# Patient Record
Sex: Female | Born: 1975
Health system: Southern US, Community
[De-identification: ages and names within clinical notes are randomized; demographics above are authoritative.]

## PROBLEM LIST (undated history)

## (undated) DIAGNOSIS — F329 Major depressive disorder, single episode, unspecified: Secondary | ICD-10-CM

## (undated) DIAGNOSIS — Z9889 Other specified postprocedural states: Secondary | ICD-10-CM

## (undated) DIAGNOSIS — K219 Gastro-esophageal reflux disease without esophagitis: Secondary | ICD-10-CM

## (undated) DIAGNOSIS — G8929 Other chronic pain: Secondary | ICD-10-CM

## (undated) DIAGNOSIS — F32A Depression, unspecified: Secondary | ICD-10-CM

## (undated) DIAGNOSIS — F419 Anxiety disorder, unspecified: Secondary | ICD-10-CM

## (undated) DIAGNOSIS — R87629 Unspecified abnormal cytological findings in specimens from vagina: Secondary | ICD-10-CM

## (undated) DIAGNOSIS — R102 Pelvic and perineal pain: Secondary | ICD-10-CM

## (undated) DIAGNOSIS — G4700 Insomnia, unspecified: Secondary | ICD-10-CM

## (undated) DIAGNOSIS — N920 Excessive and frequent menstruation with regular cycle: Secondary | ICD-10-CM

## (undated) DIAGNOSIS — M51369 Other intervertebral disc degeneration, lumbar region without mention of lumbar back pain or lower extremity pain: Secondary | ICD-10-CM

## (undated) DIAGNOSIS — Z8742 Personal history of other diseases of the female genital tract: Secondary | ICD-10-CM

## (undated) DIAGNOSIS — R140 Abdominal distension (gaseous): Secondary | ICD-10-CM

## (undated) DIAGNOSIS — R61 Generalized hyperhidrosis: Secondary | ICD-10-CM

## (undated) DIAGNOSIS — Z87898 Personal history of other specified conditions: Secondary | ICD-10-CM

## (undated) DIAGNOSIS — K589 Irritable bowel syndrome without diarrhea: Secondary | ICD-10-CM

## (undated) DIAGNOSIS — IMO0002 Reserved for concepts with insufficient information to code with codable children: Secondary | ICD-10-CM

## (undated) DIAGNOSIS — M5126 Other intervertebral disc displacement, lumbar region: Secondary | ICD-10-CM

## (undated) DIAGNOSIS — R87619 Unspecified abnormal cytological findings in specimens from cervix uteri: Secondary | ICD-10-CM

## (undated) DIAGNOSIS — M5136 Other intervertebral disc degeneration, lumbar region: Secondary | ICD-10-CM

## (undated) DIAGNOSIS — M549 Dorsalgia, unspecified: Secondary | ICD-10-CM

## (undated) DIAGNOSIS — K529 Noninfective gastroenteritis and colitis, unspecified: Secondary | ICD-10-CM

## (undated) HISTORY — DX: Gastro-esophageal reflux disease without esophagitis: K21.9

## (undated) HISTORY — DX: Depression, unspecified: F32.A

## (undated) HISTORY — DX: Insomnia, unspecified: G47.00

## (undated) HISTORY — DX: Anxiety disorder, unspecified: F41.9

## (undated) HISTORY — DX: Pelvic and perineal pain: R10.2

## (undated) HISTORY — DX: Personal history of other diseases of the female genital tract: Z87.42

## (undated) HISTORY — DX: Excessive and frequent menstruation with regular cycle: N92.0

## (undated) HISTORY — DX: Noninfective gastroenteritis and colitis, unspecified: K52.9

## (undated) HISTORY — DX: Unspecified abnormal cytological findings in specimens from vagina: R87.629

## (undated) HISTORY — DX: Generalized hyperhidrosis: R61

## (undated) HISTORY — DX: Reserved for concepts with insufficient information to code with codable children: IMO0002

## (undated) HISTORY — DX: Irritable bowel syndrome, unspecified: K58.9

## (undated) HISTORY — DX: Major depressive disorder, single episode, unspecified: F32.9

## (undated) HISTORY — DX: Abdominal distension (gaseous): R14.0

## (undated) HISTORY — DX: Unspecified abnormal cytological findings in specimens from cervix uteri: R87.619

## (undated) HISTORY — DX: Personal history of other specified conditions: Z87.898

---

## 2000-10-21 ENCOUNTER — Encounter: Payer: Self-pay | Admitting: Family Medicine

## 2000-10-21 ENCOUNTER — Ambulatory Visit (HOSPITAL_COMMUNITY): Admission: RE | Admit: 2000-10-21 | Discharge: 2000-10-21 | Payer: Self-pay | Admitting: Family Medicine

## 2000-10-22 ENCOUNTER — Ambulatory Visit (HOSPITAL_COMMUNITY): Admission: RE | Admit: 2000-10-22 | Discharge: 2000-10-22 | Payer: Self-pay | Admitting: Family Medicine

## 2000-10-22 ENCOUNTER — Encounter: Payer: Self-pay | Admitting: Family Medicine

## 2001-12-29 ENCOUNTER — Emergency Department (HOSPITAL_COMMUNITY): Admission: EM | Admit: 2001-12-29 | Discharge: 2001-12-29 | Payer: Self-pay | Admitting: Emergency Medicine

## 2001-12-29 ENCOUNTER — Encounter: Payer: Self-pay | Admitting: Emergency Medicine

## 2008-11-30 ENCOUNTER — Encounter: Payer: Self-pay | Admitting: Urgent Care

## 2008-11-30 ENCOUNTER — Ambulatory Visit: Payer: Self-pay | Admitting: Gastroenterology

## 2008-11-30 DIAGNOSIS — M549 Dorsalgia, unspecified: Secondary | ICD-10-CM | POA: Insufficient documentation

## 2008-11-30 DIAGNOSIS — R109 Unspecified abdominal pain: Secondary | ICD-10-CM | POA: Insufficient documentation

## 2008-11-30 DIAGNOSIS — R197 Diarrhea, unspecified: Secondary | ICD-10-CM

## 2008-11-30 DIAGNOSIS — S329XXA Fracture of unspecified parts of lumbosacral spine and pelvis, initial encounter for closed fracture: Secondary | ICD-10-CM | POA: Insufficient documentation

## 2008-11-30 DIAGNOSIS — F411 Generalized anxiety disorder: Secondary | ICD-10-CM

## 2008-11-30 DIAGNOSIS — K219 Gastro-esophageal reflux disease without esophagitis: Secondary | ICD-10-CM | POA: Insufficient documentation

## 2008-12-04 LAB — CONVERTED CEMR LAB
ALT: 9 units/L (ref 0–35)
AST: 12 units/L (ref 0–37)
Albumin: 4.5 g/dL (ref 3.5–5.2)
Alkaline Phosphatase: 47 units/L (ref 39–117)
BUN: 9 mg/dL (ref 6–23)
Basophils Absolute: 0 10*3/uL (ref 0.0–0.1)
Basophils Relative: 0 % (ref 0–1)
CO2: 21 meq/L (ref 19–32)
Calcium: 9.2 mg/dL (ref 8.4–10.5)
Chloride: 106 meq/L (ref 96–112)
Creatinine, Ser: 0.81 mg/dL (ref 0.40–1.20)
Eosinophils Absolute: 0.1 10*3/uL (ref 0.0–0.7)
Eosinophils Relative: 1 % (ref 0–5)
Glucose, Bld: 112 mg/dL — ABNORMAL HIGH (ref 70–99)
HCT: 44.8 % (ref 36.0–46.0)
Hemoglobin: 13 g/dL (ref 12.0–15.0)
IgA: 118 mg/dL (ref 68–378)
Lymphocytes Relative: 20 % (ref 12–46)
Lymphs Abs: 1.9 10*3/uL (ref 0.7–4.0)
MCHC: 29 g/dL — ABNORMAL LOW (ref 30.0–36.0)
MCV: 105.9 fL — ABNORMAL HIGH (ref 78.0–100.0)
Monocytes Absolute: 0.4 10*3/uL (ref 0.1–1.0)
Monocytes Relative: 5 % (ref 3–12)
Neutro Abs: 7.1 10*3/uL (ref 1.7–7.7)
Neutrophils Relative %: 74 % (ref 43–77)
Platelets: 338 10*3/uL (ref 150–400)
Potassium: 4 meq/L (ref 3.5–5.3)
RBC: 4.23 M/uL (ref 3.87–5.11)
RDW: 12.7 % (ref 11.5–15.5)
Sed Rate: 2 mm/hr (ref 0–22)
Sodium: 139 meq/L (ref 135–145)
Tissue Transglutaminase Ab, IgA: 0.3 units (ref <7)
Total Bilirubin: 0.4 mg/dL (ref 0.3–1.2)
Total Protein: 6.4 g/dL (ref 6.0–8.3)
WBC: 9.6 10*3/uL (ref 4.0–10.5)

## 2008-12-06 ENCOUNTER — Encounter (INDEPENDENT_AMBULATORY_CARE_PROVIDER_SITE_OTHER): Payer: Self-pay | Admitting: *Deleted

## 2008-12-10 ENCOUNTER — Encounter: Payer: Self-pay | Admitting: Gastroenterology

## 2008-12-24 ENCOUNTER — Ambulatory Visit: Payer: Self-pay | Admitting: Gastroenterology

## 2008-12-24 ENCOUNTER — Ambulatory Visit (HOSPITAL_COMMUNITY): Admission: RE | Admit: 2008-12-24 | Discharge: 2008-12-24 | Payer: Self-pay | Admitting: Gastroenterology

## 2008-12-24 ENCOUNTER — Encounter: Payer: Self-pay | Admitting: Gastroenterology

## 2008-12-24 HISTORY — PX: UPPER GASTROINTESTINAL ENDOSCOPY: SHX188

## 2008-12-24 HISTORY — PX: COLONOSCOPY: SHX174

## 2008-12-31 ENCOUNTER — Encounter: Payer: Self-pay | Admitting: Gastroenterology

## 2009-01-01 ENCOUNTER — Ambulatory Visit: Payer: Self-pay | Admitting: Gastroenterology

## 2009-01-02 DIAGNOSIS — K589 Irritable bowel syndrome without diarrhea: Secondary | ICD-10-CM | POA: Insufficient documentation

## 2009-01-18 ENCOUNTER — Encounter (INDEPENDENT_AMBULATORY_CARE_PROVIDER_SITE_OTHER): Payer: Self-pay | Admitting: *Deleted

## 2009-01-28 ENCOUNTER — Ambulatory Visit: Payer: Self-pay | Admitting: Gastroenterology

## 2009-01-28 ENCOUNTER — Ambulatory Visit (HOSPITAL_COMMUNITY): Admission: RE | Admit: 2009-01-28 | Discharge: 2009-01-28 | Payer: Self-pay | Admitting: Gastroenterology

## 2009-01-29 ENCOUNTER — Telehealth (INDEPENDENT_AMBULATORY_CARE_PROVIDER_SITE_OTHER): Payer: Self-pay

## 2009-01-29 ENCOUNTER — Telehealth: Payer: Self-pay | Admitting: Gastroenterology

## 2009-01-29 HISTORY — PX: OTHER SURGICAL HISTORY: SHX169

## 2009-04-09 ENCOUNTER — Ambulatory Visit: Payer: Self-pay | Admitting: Gastroenterology

## 2009-04-12 DIAGNOSIS — R143 Flatulence: Secondary | ICD-10-CM

## 2009-04-12 DIAGNOSIS — R141 Gas pain: Secondary | ICD-10-CM

## 2009-04-12 DIAGNOSIS — R142 Eructation: Secondary | ICD-10-CM

## 2009-04-16 LAB — CONVERTED CEMR LAB: Creatinine, Ser: 0.89 mg/dL (ref 0.40–1.20)

## 2009-04-18 ENCOUNTER — Ambulatory Visit (HOSPITAL_COMMUNITY): Admission: RE | Admit: 2009-04-18 | Discharge: 2009-04-18 | Payer: Self-pay | Admitting: Gastroenterology

## 2009-04-23 ENCOUNTER — Encounter (INDEPENDENT_AMBULATORY_CARE_PROVIDER_SITE_OTHER): Payer: Self-pay

## 2009-06-04 ENCOUNTER — Other Ambulatory Visit: Admission: RE | Admit: 2009-06-04 | Discharge: 2009-06-04 | Payer: Self-pay | Admitting: Obstetrics & Gynecology

## 2010-04-01 NOTE — Letter (Signed)
Summary: Plan of Care, Need to Discuss  [facility]  [address]   [place], [place] [address]   Phone: [phone]  Fax: [phone]    April 23, 2009  KALI DEADWYLER [address] [place], [place]  [address] Dec 05, 1975   Dear Ms. Mayford Knife,   We are writing this letter to inform you of treatment plans and/or discuss your plan of care.  We have tried several times to contact you; however, we have yet to reach you.  We ask that you please contact our office for follow-up on your gastrointestinal issues.  We can  be reached at [phone] to schedule an appointment, or to speak with someone regarding your health care needs.  Please do not neglect your health.   Sincerely,    Cloria Spring LPN  [facility] Ph: [phone]    Fax: [phone]

## 2010-04-01 NOTE — Assessment & Plan Note (Signed)
Summary: ABD PAIN, BLOATING, DIARRHEA   Visit Type:  Follow-up Visit Primary Care Provider:  Regino Schultze, M.D.  Chief Complaint:  1 month follow up- still has same symptoms.  History of Present Illness: Pt's diarrhea is improved with Abx but bloating and abd pain never changed. Thinks she took hyoscyamine but it didn't help. Uses BR but diarrhea not as frequent. No difference with lactose free diet and even lactaid pills no difference. No difference with Align. Taking stuff for acid reflux. Bms: every day and sometime every other day, but when she goes and then has to go back. Diarrhea 3-4 times a week. Pain worse after eating and better after BM and passing gas.   Current Medications (verified): 1)  Omeprazole 20 Mg Cpdr (Omeprazole) .... Take 1 Tablet By Mouth Once A Day 2)  Muscle Relaxer .... 1/2 Tablet At Bedtime 3)  Lorcet Plus 7.5-650 Mg Tabs (Hydrocodone-Acetaminophen) .... As Needed, Very Seldom 4)  Xanax 0.5 Mg Tabs (Alprazolam) .... One By Mouth As Needed For Anxiety  Allergies (verified): No Known Drug Allergies  Past History:  Past Medical History: ANXIETY (ICD-300.00) BACK PAIN, CHRONIC (ICD-724.5) FRACTURE, PELVIS, RIGHT (ICD-808.8) GERD (ICD-530.81) 2008: ABDOMINAL PAIN (ICD-789.00) and DIARRHEA, CHRONIC (ICD-787.91) **WORKUP-OCT 2010-neg celiac serologies, cdiff, routine cx, pos fecal lactoferrin, TCS/EGD/Bx-nl colon, and duodenum, NOV 2010: HBT-pos for SBBO  Family History: Reviewed history from 11/30/2008 and no changes required. No known family history of colorectal carcinoma, IBD, liver or chronic GI problems. Father: (70) COPD Mother: (28) healthy Siblings: 2 sister 40/31 healthy  Social History: Reviewed history from 11/30/2008 and no changes required. married (1994) 2 sons (15/12) healthy Occupation: Physicist, medical Patient currently smokes. 20 pkyr Patient has been counseled to quit. No desire to quit now. Alcohol Use - no Daily Caffeine  Use Illicit Drug Use - no Patient gets regular exercise.  Review of Systems       Last ABD imaging 2002-ABD US-normal, HIDA SCAN-GB EF 48%  Vital Signs:  Patient profile:   35 year old female Height:      64 inches Weight:      175 pounds BMI:     30.15 Temp:     97.6 degrees F oral Pulse rate:   64 / minute BP sitting:   118 / 84  (left arm) Cuff size:   regular  Vitals Entered By: Hendricks Limes LPN (April 09, 2009 8:54 AM)  Physical Exam  General:  Well developed, well nourished, no acute distress. Head:  Normocephalic and atraumatic. Eyes:  PERRLA, no icterus. Mouth:  No deformity or lesions. Lungs:  Clear throughout to auscultation. Heart:  Regular rate and rhythm; no murmurs, rubs,  or bruits. Abdomen:  Soft, nondistended. Normal bowel sounds. Extremities:  No clubbing, cyanosis, edema or deformities noted. Neurologic:  Alert and  oriented x4;  grossly normal neurologically.  Impression & Recommendations:  Problem # 1:  ABDOMINAL PAIN (ICD-789.00) Assessment Unchanged  Most like 2o to IBS-d, but differential diagnosis includes adhesive disease, enodmetriosis, pancreatic or ovarian  malignancy or peritoneal carcinomatosis. Plan CT A/P w/ iv and oral contrast. Avoid food with bloating. SEE HANDOUT. Continue Imipramine 1 OR 2 at bedtime. Use Levsin 30 minute before meals TWICE A DAY. RETURN VISIT IN 2 MOS. Consider DA-GDF if no improvement.  CC: PCP  Orders: Est. Patient Level IV ([address])  Other Orders: T-Creatinine Blood ([id number])  Patient Instructions: 1)  Avoid food with bloating. SEE HANDOUT 2)  Continue Imipramine 1 OR 2 at bedtime.  3)  Use Levsin 30 minute before meals TWICE A DAY. 4)  Will order CAT SCAN. 5)  RETURN VISIT IN 2 MOS. 6)  The medication list was reviewed and reconciled.  All changed / newly prescribed medications were explained.  A complete medication list was provided to the patient / caregiver. Prescriptions: LEVSIN/SL 0.125 MG  SUBL (HYOSCYAMINE SULFATE) 1 sl prior to meals two times a day  #60 x 5   Entered and Authorized by:   West Bali MD   Signed by:   West Bali MD on 04/09/2009   Method used:   Electronically to        [facility] [phone]* (retail)       [address]       [place], [place]  [address]       Ph: [phone] or [phone]       Fax: [phone]   RxID:   [id number] IMIPRAMINE HCL 25 MG TABS (IMIPRAMINE HCL) 1-2 by mouth qhs  #60 x 5   Entered and Authorized by:   West Bali MD   Signed by:   West Bali MD on 04/09/2009   Method used:   [facility]ronically to        [facility] [phone]* (retail)       [address]       [place], [place]  [address]       Ph: [phone] or [phone]       Fax: [phone]   RxID:   [id number]   Appended Document: ABD PAIN, BLOATING, DIARRHEA Spoke witha  representative at [facility], Verlan Friends: Notification [phone] valid for 45 calendar days.   Appended Document: ABD PAIN, BLOATING, DIARRHEA Please call pt. Her CT shows no evidence of ovarian CA. Her Sx are most likely due to IBS. Cotninue Imipramine and Levsin. OPV in 2 mos.  Appended Document: ABD PAIN, BLOATING, DIARRHEA tried to call pt- LMOM. pt is scheduled for appt on 06/04/2009  Appended Document: ABD PAIN, BLOATING, DIARRHEA LMOM to call.  Appended Document: ABD PAIN, BLOATING, DIARRHEA Letter mailed to call to discuss.  Appended Document: ABD PAIN, BLOATING, DIARRHEA Pt called and was informed of the above.

## 2010-04-01 NOTE — Letter (Signed)
Summary: Internal Other  Internal Other   Imported By: Ave Filter 04/09/2009 09:55:04  _____________________________________________________________________  External Attachment:    Type:   Image     Comment:   External Document

## 2010-04-08 ENCOUNTER — Telehealth (INDEPENDENT_AMBULATORY_CARE_PROVIDER_SITE_OTHER): Payer: Self-pay | Admitting: *Deleted

## 2010-04-17 NOTE — Progress Notes (Signed)
Summary: PT WOULD LIKE TO TRANSFER HER CARE  ---- Converted from flag ---- ---- 04/08/2010 10:03 AM, Diana Eves wrote: I offered appt with RMR for Friday at 2pm, but pt declined. She wants her records transferred to [facility] office.  ---- 04/08/2010 7:37 AM, Jonathon Bellows MD, Caleen Essex wrote: What if I come to the office and see her in pm - next day or 2?  ---- 04/07/2010 4:52 PM, Diana Eves wrote: I received referral on pt from [facility] and she refuses to make appointment because she was not happy with [facility] and wants to transfer her records to [facility]. She said her symptoms never improved and were worse than before. ------------------------------

## 2010-05-27 ENCOUNTER — Ambulatory Visit (INDEPENDENT_AMBULATORY_CARE_PROVIDER_SITE_OTHER): Payer: 59 | Admitting: Internal Medicine

## 2010-05-27 DIAGNOSIS — R141 Gas pain: Secondary | ICD-10-CM

## 2010-05-27 DIAGNOSIS — K9089 Other intestinal malabsorption: Secondary | ICD-10-CM

## 2010-06-05 LAB — HEMOGLOBIN AND HEMATOCRIT, BLOOD
HCT: 37.6 % (ref 36.0–46.0)
Hemoglobin: 13.2 g/dL (ref 12.0–15.0)

## 2010-06-08 NOTE — Consult Note (Signed)
NAME:  Kathleen Stewart, Kathleen Stewart NO.:  [id number]  MEDICAL RECORD NO.:  [id number]          PATIENT TYPE:  LOCATION:                                 FACILITY:  PHYSICIAN:  Lionel December, M.D.         DATE OF BIRTH:  DATE OF CONSULTATION:  05/27/2010 DATE OF DISCHARGE:                                CONSULTATION   PRESENTING COMPLAINT:  Bloating and diarrhea.  HISTORY OF PRESENT ILLNESS:  Kathleen Stewart is a 35 year old Caucasian female who is referred through courtesy of Dr. Karleen Hampshire for GI evaluation.  Kathleen Stewart states that she has had problems with bloating for at least 10 years.  Initially, it was sporadic and mild, but lately she has had great deal of difficulty, particularly for the last years.  She feels bloated every day.  When she wakes up in the morning, she feels the best, but as the day progresses and she eats and drinks liquids, her bloating increases, and at times she feels as if she is pregnant.  Other times, she is so bloated that she has difficulty taking a deep breath. She also complains of diarrhea.  She has at least four stools every day. She denies melena or rectal bleeding.  She has never been constipated. She has noted some burping and passes flatus.  At times, it is foul smelling, but not always.  She also hears lot of rumbling and gurgling in her abdomen, and she may have some discomfort across her mid or upper abdomen, may be once a week.  Kathleen Stewart has been evaluated by Dr. Darrick Penna of [facility]. She had negative celiac antibody panel in October 2010.  Her chemistry panel was normal.  Her sed rate was 2.  She has been tried on lactose- free diet as well as took a lime for a month and she says it did not make any difference.  Also, had stool studies in October 2010, and these were negative except her lactoferrin was positive.  Finally, she had EGD and a colonoscopy.  She had normal terminal ileum, normal colonic mucosa, small external  hemorrhoids, and patchy erythema to gastric mucosa, and patchy mucosal abnormality to the duodenum.  She also had internal hemorrhoids.  A biopsy from colonic mucosa reveals no changes of colitis.  Duodenal biopsy similarly was unremarkable, and a gastric biopsy showed mild gastritis.  Stains were negative for H. pylori.  Finally, she had hydrogen breath test for small bowel bacterial overgrowth in November 2010, and this test was abnormal, consistent with small bowel bacterial overgrowth.  She was given antibiotic.  She does not remember the name.  She says it did not make any difference.  Kathleen Stewart states her appetite has been up and down; however, she has not lost any weight.  Along the way, she also took dicyclomine, but it did not help, so she stopped the medication.  She is not having any problems with skin rash or wheezing.  CURRENT MEDICATIONS: 1. Diazepam 10 mg p.o. at bedtime. 2. Hydrocodone 10/650 q.6 h. p.r.n., but she takes it occasionally. 3. Omeprazole 20 mg p.o. q.a.m. 4. Ranitidine 150  mg p.o. q.a.m.  PAST MEDICAL HISTORY:  She has had symptoms of GERD for 10 years.  She states symptoms are not well controlled unless he takes ranitidine along with her omeprazole.  She has chronic insomnia, history of depression, she was on therapy for 7 years, but was discontinued.  She has 20 tattoos placed by a professional.  Last year, she was also treated for H. pylori infection.  She has a chronic back pain possibly due to chip fracture, but no details are available.  ALLERGIES:  NK.  FAMILY HISTORY:  Mother in 7s in good health.  Father is 40 and has breathing problems.  She has two sisters in good health.  SOCIAL HISTORY:  She is married.  She has two sons ages 58 and 31 in good health.  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  Prior to that, she worked at [employer] and [employer] for 3 years.  She smokes about a pack a day, which she has done for 20 years,  and she does not drink alcohol.  She states the past when she tried drinking alcohol she developed epigastric pain and emesis.  OBJECTIVE:  VITAL SIGNS:  Weight 180 pounds.  She is 64-1/2 inch tall. Pulse 74 per minute, blood pressure 124/72, temperature is 97.9. HEENT:  Conjunctivae are pink.  Sclerae nonicteric.  Oropharyngeal mucosa is normal. NECK:  No neck masses or thyromegaly noted. CARDIAC:  Regular rhythm.  Normal S1 and S2.  No murmur or gallop noted. LUNGS:  Clear to auscultation. ABDOMEN:  Full, but symmetrical.  Bowel sounds are normal.  On palpation, soft abdomen without tenderness, organomegaly, or masses. Percussion note is tympanitic. EXTREMITIES:  No peripheral edema or clubbing noted.  She has multiple tattoos over the upper extremities.  ASSESSMENT:  Esbeydi is 35 year old Caucasian female who has been struggling with bloating for the last couple of years, and she also has diarrhea.  She has had fairly extensive workup by Dr. Jonette Eva.  Her stool studies were negative with the exception of positive lactoferrin, but she did not have any evidence of endoscopic or microscopic colitis and similarly duodenal biopsies did not show any evidence of mucosal disease, and her celiac antibody panel was negative.  Her hydrogen breath test was abnormal suggesting small bowel bacterial overgrowth. However, she did not respond to short course of antibiotic therapy. Before we embark on further evaluation, I believe she needs to be retreated with antibiotic, but for a longer duration.  Chronic GERD.  Symptoms appear to be well controlled with a combination of a PPI and H2B.  RECOMMENDATIONS:  The patient advised to eat yogurt daily.  We will start on Xifaxan, prescription given for 550 mg tablets 60.  The patient advised to take two-thirds of a pill 3 times a day.  She is also advised to keep symptom diary, and she will return for OV in 4 weeks.  We appreciate the  opportunity to participate in the care of this nice lady.     Lionel December, M.D.     NR/MEDQ  D:  05/28/2010  T:  05/28/2010  Job:  [id number]  cc:   Kirk Ruths, M.D. Fax: [phone]  Electronically Signed by Lionel December M.D. on 06/08/2010 01:59:09 PM

## 2010-07-01 ENCOUNTER — Ambulatory Visit (INDEPENDENT_AMBULATORY_CARE_PROVIDER_SITE_OTHER): Payer: 59 | Admitting: Internal Medicine

## 2010-08-19 ENCOUNTER — Ambulatory Visit (INDEPENDENT_AMBULATORY_CARE_PROVIDER_SITE_OTHER): Payer: 59 | Admitting: Internal Medicine

## 2010-08-19 ENCOUNTER — Other Ambulatory Visit (INDEPENDENT_AMBULATORY_CARE_PROVIDER_SITE_OTHER): Payer: Self-pay | Admitting: Internal Medicine

## 2010-08-19 ENCOUNTER — Ambulatory Visit (HOSPITAL_COMMUNITY)
Admission: RE | Admit: 2010-08-19 | Discharge: 2010-08-19 | Disposition: A | Discharge status: Home / Self Care | Payer: 59 | Source: Ambulatory Visit | Attending: Internal Medicine | Admitting: Internal Medicine

## 2010-08-19 DIAGNOSIS — R635 Abnormal weight gain: Secondary | ICD-10-CM

## 2010-08-19 DIAGNOSIS — R1032 Left lower quadrant pain: Secondary | ICD-10-CM

## 2010-08-19 DIAGNOSIS — R142 Eructation: Secondary | ICD-10-CM

## 2010-08-19 DIAGNOSIS — R140 Abdominal distension (gaseous): Secondary | ICD-10-CM

## 2010-08-19 MED ORDER — IOHEXOL 300 MG/ML  SOLN
100.0000 mL | Freq: Once | INTRAMUSCULAR | Status: AC | PRN
  Administered 2010-08-19: 100 mL via INTRAVENOUS

## 2010-09-07 LAB — BASIC METABOLIC PANEL
Creatinine: 0.9 mg/dL (ref 0.5–1.1)
Glucose: 89 mg/dL
Sodium: 141 mmol/L (ref 137–147)

## 2010-09-07 LAB — CBC AND DIFFERENTIAL: Hemoglobin: 12.7 g/dL (ref 12.0–16.0)

## 2010-09-23 ENCOUNTER — Other Ambulatory Visit (HOSPITAL_COMMUNITY): Payer: Self-pay | Admitting: Family Medicine

## 2010-09-23 DIAGNOSIS — M549 Dorsalgia, unspecified: Secondary | ICD-10-CM

## 2010-09-25 ENCOUNTER — Ambulatory Visit (HOSPITAL_COMMUNITY): Payer: 59

## 2010-09-26 ENCOUNTER — Ambulatory Visit (HOSPITAL_COMMUNITY)
Admission: RE | Admit: 2010-09-26 | Discharge: 2010-09-26 | Disposition: A | Discharge status: Home / Self Care | Payer: 59 | Source: Ambulatory Visit | Attending: Family Medicine | Admitting: Family Medicine

## 2010-09-26 DIAGNOSIS — M5126 Other intervertebral disc displacement, lumbar region: Secondary | ICD-10-CM | POA: Insufficient documentation

## 2010-09-26 DIAGNOSIS — M79609 Pain in unspecified limb: Secondary | ICD-10-CM | POA: Insufficient documentation

## 2010-09-26 DIAGNOSIS — M549 Dorsalgia, unspecified: Secondary | ICD-10-CM

## 2010-09-26 DIAGNOSIS — M545 Low back pain, unspecified: Secondary | ICD-10-CM | POA: Insufficient documentation

## 2010-10-02 ENCOUNTER — Telehealth (INDEPENDENT_AMBULATORY_CARE_PROVIDER_SITE_OTHER): Payer: Self-pay | Admitting: *Deleted

## 2010-10-02 DIAGNOSIS — R197 Diarrhea, unspecified: Secondary | ICD-10-CM

## 2010-10-02 DIAGNOSIS — R140 Abdominal distension (gaseous): Secondary | ICD-10-CM

## 2010-10-02 NOTE — Telephone Encounter (Signed)
Lab order faxed and letter sent to the patient..  

## 2010-10-13 ENCOUNTER — Other Ambulatory Visit: Payer: Self-pay | Admitting: Obstetrics & Gynecology

## 2010-10-13 ENCOUNTER — Other Ambulatory Visit (HOSPITAL_COMMUNITY)
Admission: RE | Admit: 2010-10-13 | Discharge: 2010-10-13 | Disposition: A | Discharge status: Home / Self Care | Payer: 59 | Source: Ambulatory Visit | Attending: Obstetrics & Gynecology | Admitting: Obstetrics & Gynecology

## 2010-10-13 DIAGNOSIS — Z01419 Encounter for gynecological examination (general) (routine) without abnormal findings: Secondary | ICD-10-CM | POA: Insufficient documentation

## 2010-10-15 ENCOUNTER — Encounter (INDEPENDENT_AMBULATORY_CARE_PROVIDER_SITE_OTHER): Payer: Self-pay

## 2010-11-10 ENCOUNTER — Ambulatory Visit (INDEPENDENT_AMBULATORY_CARE_PROVIDER_SITE_OTHER): Payer: 59 | Admitting: Internal Medicine

## 2010-12-02 ENCOUNTER — Ambulatory Visit (INDEPENDENT_AMBULATORY_CARE_PROVIDER_SITE_OTHER): Payer: 59 | Admitting: Internal Medicine

## 2010-12-02 ENCOUNTER — Encounter (INDEPENDENT_AMBULATORY_CARE_PROVIDER_SITE_OTHER): Payer: Self-pay

## 2011-12-24 ENCOUNTER — Other Ambulatory Visit (HOSPITAL_COMMUNITY): Payer: Self-pay | Admitting: Physical Medicine and Rehabilitation

## 2011-12-24 DIAGNOSIS — M545 Low back pain: Secondary | ICD-10-CM

## 2011-12-30 ENCOUNTER — Ambulatory Visit (HOSPITAL_COMMUNITY)
Admission: RE | Admit: 2011-12-30 | Discharge: 2011-12-30 | Disposition: A | Discharge status: Home / Self Care | Payer: 59 | Source: Ambulatory Visit | Attending: Physical Medicine and Rehabilitation | Admitting: Physical Medicine and Rehabilitation

## 2011-12-30 ENCOUNTER — Other Ambulatory Visit (HOSPITAL_COMMUNITY): Payer: 59

## 2011-12-30 DIAGNOSIS — M545 Low back pain, unspecified: Secondary | ICD-10-CM | POA: Insufficient documentation

## 2011-12-30 DIAGNOSIS — M5137 Other intervertebral disc degeneration, lumbosacral region: Secondary | ICD-10-CM | POA: Insufficient documentation

## 2011-12-30 DIAGNOSIS — M51379 Other intervertebral disc degeneration, lumbosacral region without mention of lumbar back pain or lower extremity pain: Secondary | ICD-10-CM | POA: Insufficient documentation

## 2011-12-30 DIAGNOSIS — M5126 Other intervertebral disc displacement, lumbar region: Secondary | ICD-10-CM | POA: Insufficient documentation

## 2012-10-19 ENCOUNTER — Other Ambulatory Visit (HOSPITAL_COMMUNITY)
Admission: RE | Admit: 2012-10-19 | Discharge: 2012-10-19 | Disposition: A | Discharge status: Home / Self Care | Payer: 59 | Source: Ambulatory Visit | Attending: Adult Health | Admitting: Adult Health

## 2012-10-19 ENCOUNTER — Encounter: Payer: Self-pay | Admitting: Adult Health

## 2012-10-19 ENCOUNTER — Ambulatory Visit (INDEPENDENT_AMBULATORY_CARE_PROVIDER_SITE_OTHER): Payer: 59 | Admitting: Adult Health

## 2012-10-19 VITALS — BP 124/68 | HR 72 | Ht 64.5 in | Wt 186.0 lb

## 2012-10-19 DIAGNOSIS — Z01419 Encounter for gynecological examination (general) (routine) without abnormal findings: Secondary | ICD-10-CM | POA: Insufficient documentation

## 2012-10-19 DIAGNOSIS — Z87898 Personal history of other specified conditions: Secondary | ICD-10-CM

## 2012-10-19 DIAGNOSIS — Z1151 Encounter for screening for human papillomavirus (HPV): Secondary | ICD-10-CM | POA: Insufficient documentation

## 2012-10-19 HISTORY — DX: Personal history of other specified conditions: Z87.898

## 2012-10-19 NOTE — Progress Notes (Signed)
Patient ID: Kathleen Stewart, female   DOB: Jul 19, 1975, 37 y.o.   MRN: [id number] History of Present Illness: Kathleen Stewart is a 37 year old white female married in for a pap and physical.She complains of oily skin and what sounds like cystic acne, she has seen dermatologist in past.   Current Medications, Allergies, Past Medical History, Past Surgical History, Family History and Social History were reviewed in [name] record.     Review of Systems: Patient denies any headaches, blurred vision, shortness of breath, chest pain, abdominal pain, problems with bowel movements, urination, or intercourse. No joint pain, but she has back pain, no mood changes, she has insomnia and has had reflux issues and bloating.She has appt with Dr Regino Schultze in future.    Physical Exam:BP 124/68  Pulse 72  Ht 5' 4.5" (1.638 m)  Wt 186 lb (84.369 kg)  BMI 31.45 kg/m2  LMP 10/08/2012 General:  Well developed, well nourished, no acute distress Skin:  Warm and dry, tan and tattooed  Neck:  Midline trachea, normal thyroid Lungs; Clear to auscultation bilaterally Breast:  No dominant palpable mass, retraction, or nipple discharge Cardiovascular: Regular rate and rhythm Abdomen:  Soft, non tender, no hepatosplenomegaly Pelvic:  External genitalia is normal in appearance.  The vagina is normal in appearance. The cervix is bulbous. Pap performed with HPV. Uterus is felt to be normal size, shape, and contour.  No                adnexal masses or tenderness noted. Extremities:  No swelling or varicosities noted Psych: alert and cooperative, seems happy   Impression: Yearly gyn exam  History of abnormal pap Oily skin   Plan: Try toner, powder makeup and change it every 6 months,may need to F/U with dermatologist Physical in 1 year Mammogram at 39  Labs with PCP

## 2012-10-19 NOTE — Patient Instructions (Addendum)
Physical in 1 year Mammogram at 39  Labs with PCP 

## 2012-10-21 ENCOUNTER — Other Ambulatory Visit (HOSPITAL_COMMUNITY): Payer: Self-pay | Admitting: Family Medicine

## 2012-10-21 DIAGNOSIS — R109 Unspecified abdominal pain: Secondary | ICD-10-CM

## 2012-10-24 ENCOUNTER — Telehealth: Payer: Self-pay | Admitting: Adult Health

## 2012-10-24 NOTE — Telephone Encounter (Signed)
Pt aware of abnormal pap and need for colpo, will make appt 

## 2012-10-27 ENCOUNTER — Ambulatory Visit (HOSPITAL_COMMUNITY)
Admission: RE | Admit: 2012-10-27 | Discharge: 2012-10-27 | Disposition: A | Discharge status: Home / Self Care | Payer: 59 | Source: Ambulatory Visit | Attending: Family Medicine | Admitting: Family Medicine

## 2012-10-27 DIAGNOSIS — K8020 Calculus of gallbladder without cholecystitis without obstruction: Secondary | ICD-10-CM | POA: Insufficient documentation

## 2012-10-27 DIAGNOSIS — R109 Unspecified abdominal pain: Secondary | ICD-10-CM | POA: Insufficient documentation

## 2012-11-01 ENCOUNTER — Ambulatory Visit (INDEPENDENT_AMBULATORY_CARE_PROVIDER_SITE_OTHER): Payer: 59 | Admitting: Obstetrics and Gynecology

## 2012-11-01 ENCOUNTER — Other Ambulatory Visit: Payer: Self-pay | Admitting: Obstetrics and Gynecology

## 2012-11-01 ENCOUNTER — Encounter: Payer: Self-pay | Admitting: Obstetrics and Gynecology

## 2012-11-01 VITALS — BP 108/78 | Ht 64.0 in | Wt 184.8 lb

## 2012-11-01 DIAGNOSIS — Z87898 Personal history of other specified conditions: Secondary | ICD-10-CM

## 2012-11-01 DIAGNOSIS — N870 Mild cervical dysplasia: Secondary | ICD-10-CM

## 2012-11-01 DIAGNOSIS — IMO0002 Reserved for concepts with insufficient information to code with codable children: Secondary | ICD-10-CM

## 2012-11-01 NOTE — Patient Instructions (Addendum)
Cervical Dysplasia Cervical dysplasia is a condition in which a woman has abnormal changes in the cells of her cervix. The cervix is the opening to the uterus (womb) between the vagina and the uterus. These changes are called cervical dysplasia and may be the first signs of cervical cancer. These cells can be taken from the cervix during a Pap test and then looked at under a microscope. With early detection, treatment, and close follow-up care, nearly all cervical dysplasia can be cured. If untreated, the mild to moderate stages of dysplasia often grow more severe.  RISK FACTORS  The following increase the risk for cervical dysplasia.  Having had a sexually transmitted disease, including:  Chlamydia.  Human papilloma virus (HPV).  Becoming sexually active before age 60.  Having had more than 1 sexual partner.  Not using protection, such as condoms, during sexual intercourse, especially with new sexual partners.  Having had cancer of the vagina or vulva.  Having a sexual partner whose previous partner had cancer of the cervix or cervical dysplasia.  Having a sexual partner who has or has had cancer of the penis.  Having a weakened immune system (HIV, organ transplant).  Being the daughter of a woman who took DES (diethylstilbestrol) during pregnancy.  A history of cervical cancer in a woman's sister or mother.  Smoking.  Having had an abnormal Pap test in the past. SYMPTOMS  There are usually no symptoms. If there are symptoms, they may be vague such as:  Abnormal vaginal discharge.  Bleeding between periods or following intercourse.  Bleeding during menopause.  Pain on intercourse (dyspareunia). DIAGNOSIS   The Pap test is the best way of detecting abnormalities of the cervix.  Biopsy (removing a piece of tissue to look at under the microscope) of the cervix when the Pap test is abnormal or when the Pap test is normal, but the cervix looks abnormal. TREATMENT   Catching and treating the changes early with Pap tests can prevent cervical cancer.  Cryotherapy freezes the abnormal cells with a steel tip instrument.  A laser can be used to remove the abnormal cells.  Loop electrocautery excision procedure (LEEP). This procedure uses a heated electrical loop to remove a cone-like portion of the cervix, including the cervical canal.  For more serious cases of cervical dysplasia, the abnormal tissue may be removed surgically by:  A cone biopsy (by cold knife, laser or LEEP). A procedure in which a portion of the center of the cervix with the cervical canal is removed.  The uterus and cervix are removed (hysterectomy). Your caregiver will advise you regarding the need and timing of Pap tests in your follow-up. Women who have been treated for dysplasia should be closely followed with pelvic exams and Pap tests. During the first year following treatment of cervical dysplasia, Pap tests should be done every 3 to 4 months. In the second year, the schedule is every 6 months, or as recommended by your caregiver. See your caregiver for new or worsening problems. HOME CARE INSTRUCTIONS   Follow the instructions and recommendations of your caregiver regarding medicines and follow-up appointments.  Only take over-the-counter or prescription medicines for pain or discomfort as directed by your caregiver.  Cramping and pelvic discomfort may follow cryotherapy. It is not abnormal to have watery discharge for several weeks after.  Laser, cone surgery, cryotherapy or LEEP can cause a bad smelling vaginal discharge. It may also cause vaginal bleeding for a couple weeks following the procedure. The discharge  may be black from the paste used to control bleeding from the cone site. This is normal.  Do not use tampons, have sexual intercourse or douche until your caregiver says it is okay. SEEK MEDICAL CARE IF:   You develop genital warts.  You need a prescription for  pain medicine following your treatment. SEEK IMMEDIATE MEDICAL CARE IF:   Your bleeding is heavier than a normal menstrual period.  You develop bright red bleeding, especially if you have blood clots.  You have a fever.  You have increasing cramps or pain not relieved with medicine.  You are lightheaded, unusually weak, or have fainting spells.  You have abnormal vaginal discharge.  You develop abdominal pain. PREVENTION   The surest way to prevent cervical dysplasia is to abstain from sexual intercourse.  Practice safe sex, use condoms and have only one sex partner who does not have other sex partners.  A Pap test is done to screen for cervical cancer.  The first Pap test should be done at age 58.  Between ages 62 and 26, Pap tests are repeated every 2 years.  Beginning at age 61, you are advised to have a Pap test every 3 years as long as your past 3 Pap tests have been normal.  Some women have medical problems that increase the chance of getting cervical cancer. Talk to your caregiver about these problems. It is especially important to talk to your caregiver if a new problem develops soon after your last Pap test. In these cases, your caregiver may recommend more frequent screening and Pap tests.  The above recommendations are the same for women who have or have not gotten the vaccine for HPV (Human Papillomavirus).  If you had a hysterectomy for a problem that was not a cancer or a condition that could lead to cancer, then you no longer need Pap tests. However, even if you no longer need a Pap test, a regular exam is a good idea to make sure no other problems are starting.   If you are between ages 76 and 72, and you have had normal Pap tests going back 10 years, you no longer need Pap tests. However, even if you no longer need a Pap test, a regular exam is a good idea to make sure no other problems are starting.   If you have had past treatment for cervical cancer or a  condition that could lead to cancer, you need Pap tests and screening for cancer for at least 20 years after your treatment.  If Pap tests have been discontinued, risk factors (such as a new sexual partner) need to be re-assessed to determine if screening should be resumed.  Some women may need screenings more often if they are at high risk for cervical cancer.  Your caregiver may do additional tests including:  Colposcopy. A procedure in which a special microscope magnifies the cells and allows the provider to closely examine the cervix, vagina, and vulva.  Biopsy. A small tissue sample is taken from the cervix, vagina or vulva. This is generally done in your caregivers office.  A cone biopsy (cold knife or laser). A large tissue sample is taken from the cervix. This procedure is usually done in an operating room under a general anesthetic. The cone often removes all abnormal tissue and so may also complete the treatment.  LEEP, also removing a circular portion of the cervix and is done in a doctors office under a local anesthetic.  Now there  is a vaccine, Gardasil, that was developed to prevent the HPV'S that can cause cancer of the cervix and genital warts. It is recommended for females ages 20 to 2. It should not be given to pregnant women until more is known about its effects on the fetus. Not all cancers of the cervix are caused by the HPV. Routine gynecology exams and Pap tests should continue as recommended by your caregiver. Document Released: 02/16/2005 Document Revised: 05/11/2011 Document Reviewed: 02/08/2008 [facility] Patient Information 2014 [place], [place].   RESULTS TO BE DISCUSSED BY PHONE IN 2 WEEKS.  IF PT HAS NOT BEEN CONTACTED IN 2 WEEKS, PT TO CALL THIS OFFICE.

## 2012-11-02 NOTE — Progress Notes (Signed)
GYNECOLOGY CLINIC PROCEDURE NOTE  37 y.o. [id number] here for colposcopy for low-grade squamous intraepithelial neoplasia (LGSIL - encompassing HPV,mild dysplasia,CIN I) pap smear on 10/19/12. Discussed role for HPV in cervical dysplasia, need for surveillance.  Patient given informed consent, signed copy in the chart, time out was performed.  Placed in lithotomy position. Cervix viewed with speculum and colposcope after application of acetic acid.   Colposcopy adequate? Yes  no visible lesions; biopsies obtained. At 11, 1 oclock on anterior lip  ECC specimen obtained. All specimens were labelled and sent to pathology. Treated with monsels. ebl 20cc  Patient was given post procedure instructions.  Will follow up pathology and manage accordingly.  Routine preventative health maintenance measures emphasized.

## 2012-11-23 ENCOUNTER — Telehealth: Payer: Self-pay | Admitting: Obstetrics and Gynecology

## 2012-11-23 NOTE — Telephone Encounter (Signed)
Pt informed of abnormal (CIN-1) biopsy results from 11/01/2012. Pt to have pap with co-testing in 1 year per Dr. Emelda Fear.

## 2013-10-25 ENCOUNTER — Ambulatory Visit (INDEPENDENT_AMBULATORY_CARE_PROVIDER_SITE_OTHER): Payer: 59 | Admitting: Adult Health

## 2013-10-25 ENCOUNTER — Encounter: Payer: Self-pay | Admitting: Adult Health

## 2013-10-25 ENCOUNTER — Other Ambulatory Visit (HOSPITAL_COMMUNITY)
Admission: RE | Admit: 2013-10-25 | Discharge: 2013-10-25 | Disposition: A | Discharge status: Home / Self Care | Payer: 59 | Source: Ambulatory Visit | Attending: Adult Health | Admitting: Adult Health

## 2013-10-25 VITALS — BP 120/80 | HR 76 | Ht 64.0 in | Wt 183.0 lb

## 2013-10-25 DIAGNOSIS — Z1151 Encounter for screening for human papillomavirus (HPV): Secondary | ICD-10-CM | POA: Insufficient documentation

## 2013-10-25 DIAGNOSIS — Z01419 Encounter for gynecological examination (general) (routine) without abnormal findings: Secondary | ICD-10-CM | POA: Insufficient documentation

## 2013-10-25 DIAGNOSIS — Z8742 Personal history of other diseases of the female genital tract: Secondary | ICD-10-CM | POA: Insufficient documentation

## 2013-10-25 HISTORY — DX: Personal history of other diseases of the female genital tract: Z87.42

## 2013-10-25 LAB — CBC
HCT: 38.2 % (ref 36.0–46.0)
Hemoglobin: 13 g/dL (ref 12.0–15.0)
MCH: 30.9 pg (ref 26.0–34.0)
MCHC: 34 g/dL (ref 30.0–36.0)
MCV: 90.7 fL (ref 78.0–100.0)
PLATELETS: 338 10*3/uL (ref 150–400)
RBC: 4.21 MIL/uL (ref 3.87–5.11)
RDW: 13.1 % (ref 11.5–15.5)
WBC: 6.7 10*3/uL (ref 4.0–10.5)

## 2013-10-25 LAB — LIPID PANEL
CHOL/HDL RATIO: 5 ratio
CHOLESTEROL: 189 mg/dL (ref 0–200)
HDL: 38 mg/dL — AB (ref >39)
LDL Cholesterol: 117 mg/dL — ABNORMAL HIGH (ref 0–99)
TRIGLYCERIDES: 171 mg/dL — AB (ref <150)
VLDL: 34 mg/dL (ref 0–40)

## 2013-10-25 LAB — COMPREHENSIVE METABOLIC PANEL
ALT: 11 U/L (ref 0–35)
AST: 14 U/L (ref 0–37)
Albumin: 4.2 g/dL (ref 3.5–5.2)
Alkaline Phosphatase: 52 U/L (ref 39–117)
BILIRUBIN TOTAL: 0.4 mg/dL (ref 0.2–1.2)
BUN: 7 mg/dL (ref 6–23)
CO2: 27 meq/L (ref 19–32)
CREATININE: 0.83 mg/dL (ref 0.50–1.10)
Calcium: 9.2 mg/dL (ref 8.4–10.5)
Chloride: 107 mEq/L (ref 96–112)
GLUCOSE: 86 mg/dL (ref 70–99)
Potassium: 4.3 mEq/L (ref 3.5–5.3)
Sodium: 139 mEq/L (ref 135–145)
Total Protein: 6.3 g/dL (ref 6.0–8.3)

## 2013-10-25 NOTE — Progress Notes (Signed)
Patient ID: Kathleen Stewart, female   DOB: 15-Dec-1975, 38 y.o.   MRN: [id number] History of Present Illness: Kathleen Stewart is a 38 year old white female in for a pap and physical,no complaints.She had a pap 10/19/12 LSIL then colpo with CIN I.   Current Medications, Allergies, Past Medical History, Past Surgical History, Family History and Social History were reviewed in [name] record.     Review of Systems: Patient denies any headaches, blurred vision, shortness of breath, chest pain, abdominal pain, problems with bowel movements, urination, or intercourse. No joint pain or mood changes.Declines to quit smoking at present time.    Physical Exam:BP 120/80  Pulse 76  Ht  (1.626 m)  Wt 183 lb (83.008 kg)  BMI 31.40 kg/m2  LMP 10/15/2013 General:  Well developed, well nourished, no acute distress Skin:  Warm and dry, tan, has tattoos Neck:  Midline trachea, normal thyroid Lungs; Clear to auscultation bilaterally Breast:  No dominant palpable mass, retraction, or nipple discharge Cardiovascular: Regular rate and rhythm Abdomen:  Soft, non tender, no hepatosplenomegaly Pelvic:  External genitalia is normal in appearance.  The vagina is normal in appearance.  The cervix is bulbous, pap with HPV performed.Marland Kitchen  Uterus is felt to be normal size, shape, and contour.  No                adnexal masses or tenderness noted. Extremities:  No swelling or varicosities noted Psych:  No mood changes,alert and cooperative,seems happy   Impression: Yearly gyn exam History of abnormal pap    Plan: Check CBC,CMP,TSH and lipids Physical in 1 year Mammogram at 39

## 2013-10-25 NOTE — Patient Instructions (Signed)
Physical in 1 year Mammogram at 39  Labs in near future 

## 2013-10-26 LAB — CYTOLOGY - PAP

## 2013-10-26 LAB — TSH: TSH: 0.866 u[IU]/mL (ref 0.350–4.500)

## 2013-10-27 ENCOUNTER — Telehealth: Payer: Self-pay | Admitting: Adult Health

## 2013-10-27 NOTE — Telephone Encounter (Signed)
Pt aware of pap and labs, decrease cars and increase exercise

## 2014-01-01 ENCOUNTER — Encounter: Payer: Self-pay | Admitting: Adult Health

## 2014-04-23 ENCOUNTER — Telehealth: Payer: Self-pay | Admitting: Adult Health

## 2014-04-23 NOTE — Telephone Encounter (Signed)
No voice mail box.

## 2014-04-23 NOTE — Telephone Encounter (Signed)
Complains of being hot and irritable with irregular periods and they are heavier, to make appt, will get some labs

## 2014-04-25 ENCOUNTER — Ambulatory Visit (INDEPENDENT_AMBULATORY_CARE_PROVIDER_SITE_OTHER): Payer: 59 | Admitting: Adult Health

## 2014-04-25 ENCOUNTER — Encounter: Payer: Self-pay | Admitting: Adult Health

## 2014-04-25 VITALS — BP 102/72 | HR 78 | Ht 64.0 in | Wt 189.0 lb

## 2014-04-25 DIAGNOSIS — Z3202 Encounter for pregnancy test, result negative: Secondary | ICD-10-CM

## 2014-04-25 DIAGNOSIS — R102 Pelvic and perineal pain: Secondary | ICD-10-CM

## 2014-04-25 DIAGNOSIS — N920 Excessive and frequent menstruation with regular cycle: Secondary | ICD-10-CM

## 2014-04-25 DIAGNOSIS — R61 Generalized hyperhidrosis: Secondary | ICD-10-CM

## 2014-04-25 DIAGNOSIS — N921 Excessive and frequent menstruation with irregular cycle: Secondary | ICD-10-CM

## 2014-04-25 HISTORY — DX: Excessive and frequent menstruation with regular cycle: N92.0

## 2014-04-25 HISTORY — DX: Pelvic and perineal pain: R10.2

## 2014-04-25 HISTORY — DX: Generalized hyperhidrosis: R61

## 2014-04-25 LAB — POCT URINE PREGNANCY: PREG TEST UR: NEGATIVE

## 2014-04-25 NOTE — Patient Instructions (Addendum)
Perimenopause Perimenopause is the time when your body begins to move into the menopause (no menstrual period for 12 straight months). It is a natural process. Perimenopause can begin 2-8 years before the menopause and usually lasts for 1 year after the menopause. During this time, your ovaries may or may not produce an egg. The ovaries vary in their production of estrogen and progesterone hormones each month. This can cause irregular menstrual periods, difficulty getting pregnant, vaginal bleeding between periods, and uncomfortable symptoms. CAUSES  Irregular production of the ovarian hormones, estrogen and progesterone, and not ovulating every month.  Other causes include:  Tumor of the pituitary gland in the brain.  Medical disease that affects the ovaries.  Radiation treatment.  Chemotherapy.  Unknown causes.  Heavy smoking and excessive alcohol intake can bring on perimenopause sooner. SIGNS AND SYMPTOMS   Hot flashes.  Night sweats.  Irregular menstrual periods.  Decreased sex drive.  Vaginal dryness.  Headaches.  Mood swings.  Depression.  Memory problems.  Irritability.  Tiredness.  Weight gain.  Trouble getting pregnant.  The beginning of losing bone cells (osteoporosis).  The beginning of hardening of the arteries (atherosclerosis). DIAGNOSIS  Your health care provider will make a diagnosis by analyzing your age, menstrual history, and symptoms. He or she will do a physical exam and note any changes in your body, especially your female organs. Female hormone tests may or may not be helpful depending on the amount of female hormones you produce and when you produce them. However, other hormone tests may be helpful to rule out other problems. TREATMENT  In some cases, no treatment is needed. The decision on whether treatment is necessary during the perimenopause should be made by you and your health care provider based on how the symptoms are affecting you  and your lifestyle. Various treatments are available, such as:  Treating individual symptoms with a specific medicine for that symptom.  Herbal medicines that can help specific symptoms.  Counseling.  Group therapy. HOME CARE INSTRUCTIONS   Keep track of your menstrual periods (when they occur, how heavy they are, how long between periods, and how long they last) as well as your symptoms and when they started.  Only take over-the-counter or prescription medicines as directed by your health care provider.  Sleep and rest.  Exercise.  Eat a diet that contains calcium (good for your bones) and soy (acts like the estrogen hormone).  Do not smoke.  Avoid alcoholic beverages.  Take vitamin supplements as recommended by your health care provider. Taking vitamin E may help in certain cases.  Take calcium and vitamin D supplements to help prevent bone loss.  Group therapy is sometimes helpful.  Acupuncture may help in some cases. SEEK MEDICAL CARE IF:   You have questions about any symptoms you are having.  You need a referral to a specialist (gynecologist, psychiatrist, or psychologist). SEEK IMMEDIATE MEDICAL CARE IF:   You have vaginal bleeding.  Your period lasts longer than 8 days.  Your periods are recurring sooner than 21 days.  You have bleeding after intercourse.  You have severe depression.  You have pain when you urinate.  You have severe headaches.  You have vision problems. Document Released: 03/26/2004 Document Revised: 12/07/2012 Document Reviewed: 09/15/2012 [facility] Patient Information 2015 [place], [place]. This information is not intended to replace advice given to you by your health care provider. Make sure you discuss any questions you have with your health care provider. Pelvic Pain Female pelvic pain  can be caused by many different things and start from a variety of places. Pelvic pain refers to pain that is located in the lower half of the  abdomen and between your hips. The pain may occur over a short period of time (acute) or may be reoccurring (chronic). The cause of pelvic pain may be related to disorders affecting the female reproductive organs (gynecologic), but it may also be related to the bladder, kidney stones, an intestinal complication, or muscle or skeletal problems. Getting help right away for pelvic pain is important, especially if there has been severe, sharp, or a sudden onset of unusual pain. It is also important to get help right away because some types of pelvic pain can be life threatening.  CAUSES  Below are only some of the causes of pelvic pain. The causes of pelvic pain can be in one of several categories.   Gynecologic.  Pelvic inflammatory disease.  Sexually transmitted infection.  Ovarian cyst or a twisted ovarian ligament (ovarian torsion).  Uterine lining that grows outside the uterus (endometriosis).  Fibroids, cysts, or tumors.  Ovulation.  Pregnancy.  Pregnancy that occurs outside the uterus (ectopic pregnancy).  Miscarriage.  Labor.  Abruption of the placenta or ruptured uterus.  Infection.  Uterine infection (endometritis).  Bladder infection.  Diverticulitis.  Miscarriage related to a uterine infection (septic abortion).  Bladder.  Inflammation of the bladder (cystitis).  Kidney stone(s).  Gastrointestinal.  Constipation.  Diverticulitis.  Neurologic.  Trauma.  Feeling pelvic pain because of mental or emotional causes (psychosomatic).  Cancers of the bowel or pelvis. EVALUATION  Your caregiver will want to take a careful history of your concerns. This includes recent changes in your health, a careful gynecologic history of your periods (menses), and a sexual history. Obtaining your family history and medical history is also important. Your caregiver may suggest a pelvic exam. A pelvic exam will help identify the location and severity of the pain. It also helps  in the evaluation of which organ system may be involved. In order to identify the cause of the pelvic pain and be properly treated, your caregiver may order tests. These tests may include:   A pregnancy test.  Pelvic ultrasonography.  An X-ray exam of the abdomen.  A urinalysis or evaluation of vaginal discharge.  Blood tests. HOME CARE INSTRUCTIONS   Only take over-the-counter or prescription medicines for pain, discomfort, or fever as directed by your caregiver.   Rest as directed by your caregiver.   Eat a balanced diet.   Drink enough fluids to make your urine clear or pale yellow, or as directed.   Avoid sexual intercourse if it causes pain.   Apply warm or cold compresses to the lower abdomen depending on which one helps the pain.   Avoid stressful situations.   Keep a journal of your pelvic pain. Write down when it started, where the pain is located, and if there are things that seem to be associated with the pain, such as food or your menstrual cycle.  Follow up with your caregiver as directed.  SEEK MEDICAL CARE IF:  Your medicine does not help your pain.  You have abnormal vaginal discharge. SEEK IMMEDIATE MEDICAL CARE IF:   You have heavy bleeding from the vagina.   Your pelvic pain increases.   You feel light-headed or faint.   You have chills.   You have pain with urination or blood in your urine.   You have uncontrolled diarrhea or vomiting.  You have a fever or persistent symptoms for more than 3 days.  You have a fever and your symptoms suddenly get worse.   You are being physically or sexually abused.  MAKE SURE YOU:  Understand these instructions.  Will watch your condition.  Will get help if you are not doing well or get worse. Document Released: 01/14/2004 Document Revised: 07/03/2013 Document Reviewed: 06/08/2011 [facility] Patient Information 2015 [place], [place]. This information is not intended to replace advice  given to you by your health care provider. Make sure you discuss any questions you have with your health care provider. Menorrhagia Menorrhagia is the medical term for when your menstrual periods are heavy or last longer than usual. With menorrhagia, every period you have may cause enough blood loss and cramping that you are unable to maintain your usual activities. CAUSES  In some cases, the cause of heavy periods is unknown, but a number of conditions may cause menorrhagia. Common causes include:  A problem with the hormone-producing thyroid gland (hypothyroid).  Noncancerous growths in the uterus (polyps or fibroids).  An imbalance of the estrogen and progesterone hormones.  One of your ovaries not releasing an egg during one or more months.  Side effects of having an intrauterine device (IUD).  Side effects of some medicines, such as anti-inflammatory medicines or blood thinners.  A bleeding disorder that stops your blood from clotting normally. SIGNS AND SYMPTOMS  During a normal period, bleeding lasts between 4 and 8 days. Signs that your periods are too heavy include:  You routinely have to change your pad or tampon every 1 or 2 hours because it is completely soaked.  You pass blood clots larger than 1 inch (2.5 cm) in size.  You have bleeding for more than 7 days.  You need to use pads and tampons at the same time because of heavy bleeding.  You need to wake up to change your pads or tampons during the night.  You have symptoms of anemia, such as tiredness, fatigue, or shortness of breath. DIAGNOSIS  Your health care provider will perform a physical exam and ask you questions about your symptoms and menstrual history. Other tests may be ordered based on what the health care provider finds during the exam. These tests can include:  Blood tests. Blood tests are used to check if you are pregnant or have hormonal changes, a bleeding or thyroid disorder, low iron levels  (anemia), or other problems.  Endometrial biopsy. Your health care provider takes a sample of tissue from the inside of your uterus to be examined under a microscope.  Pelvic ultrasound. This test uses sound waves to make a picture of your uterus, ovaries, and vagina. The pictures can show if you have fibroids or other growths.  Hysteroscopy. For this test, your health care provider will use a small telescope to look inside your uterus. Based on the results of your initial tests, your health care provider may recommend further testing. TREATMENT  Treatment may not be needed. If it is needed, your health care provider may recommend treatment with one or more medicines first. If these do not reduce bleeding enough, a surgical treatment might be an option. The best treatment for you will depend on:   Whether you need to prevent pregnancy.  Your desire to have children in the future.  The cause and severity of your bleeding.  Your opinion and personal preference.  Medicines for menorrhagia may include:  Birth control methods that use hormones. These  include the pill, skin patch, vaginal ring, shots that you get every 3 months, hormonal IUD, and implant. These treatments reduce bleeding during your menstrual period.  Medicines that thicken blood and slow bleeding.  Medicines that reduce swelling, such as ibuprofen.  Medicines that contain a synthetic hormone called progestin.   Medicines that make the ovaries stop working for a short time.  You may need surgical treatment for menorrhagia if the medicines are unsuccessful. Treatment options include:  Dilation and curettage (D&C). In this procedure, your health care provider opens (dilates) your cervix and then scrapes or suctions tissue from the lining of your uterus to reduce menstrual bleeding.  Operative hysteroscopy. This procedure uses a tiny tube with a light (hysteroscope) to view your uterine cavity and can help in the  surgical removal of a polyp that may be causing heavy periods.  Endometrial ablation. Through various techniques, your health care provider permanently destroys the entire lining of your uterus (endometrium). After endometrial ablation, most women have little or no menstrual flow. Endometrial ablation reduces your ability to become pregnant.  Endometrial resection. This surgical procedure uses an electrosurgical wire loop to remove the lining of the uterus. This procedure also reduces your ability to become pregnant.  Hysterectomy. Surgical removal of the uterus and cervix is a permanent procedure that stops menstrual periods. Pregnancy is not possible after a hysterectomy. This procedure requires anesthesia and hospitalization. HOME CARE INSTRUCTIONS   Only take over-the-counter or prescription medicines as directed by your health care provider. Take prescribed medicines exactly as directed. Do not change or switch medicines without consulting your health care provider.  Take any prescribed iron pills exactly as directed by your health care provider. Long-term heavy bleeding may result in low iron levels. Iron pills help replace the iron your body lost from heavy bleeding. Iron may cause constipation. If this becomes a problem, increase the bran, fruits, and roughage in your diet.  Do not take aspirin or medicines that contain aspirin 1 week before or during your menstrual period. Aspirin may make the bleeding worse.  If you need to change your sanitary pad or tampon more than once every 2 hours, stay in bed and rest as much as possible until the bleeding stops.  Eat well-balanced meals. Eat foods high in iron. Examples are leafy green vegetables, meat, liver, eggs, and whole grain breads and cereals. Do not try to lose weight until the abnormal bleeding has stopped and your blood iron level is back to normal. SEEK MEDICAL CARE IF:   You soak through a pad or tampon every 1 or 2 hours, and this  happens every time you have a period.  You need to use pads and tampons at the same time because you are bleeding so much.  You need to change your pad or tampon during the night.  You have a period that lasts for more than 8 days.  You pass clots bigger than 1 inch wide.  You have irregular periods that happen more or less often than once a month.  You feel dizzy or faint.  You feel very weak or tired.  You feel short of breath or feel your heart is beating too fast when you exercise.  You have nausea and vomiting or diarrhea while you are taking your medicine.  You have any problems that may be related to the medicine you are taking. SEEK IMMEDIATE MEDICAL CARE IF:   You soak through 4 or more pads or tampons in 2  hours.  You have any bleeding while you are pregnant. MAKE SURE YOU:   Understand these instructions.  Will watch your condition.  Will get help right away if you are not doing well or get worse. Document Released: 02/16/2005 Document Revised: 02/21/2013 Document Reviewed: 08/07/2012 [facility] Patient Information 2015 [place], [place]. This information is not intended to replace advice given to you by your health care provider. Make sure you discuss any questions you have with your health care provider. Return in 1 week for [place]

## 2014-04-25 NOTE — Progress Notes (Signed)
Subjective:     Patient ID: Kathleen Stewart, female   DOB: 12/23/1975, 39 y.o.   MRN: [id number]  HPI Kathleen NiemannJaime is a 39 year old white female, married in complaining of heavy periods and night sweats  With trouble sleeping and irritable at times.Has some pelvic pain at times.Has some SUI. May change every 2 hours with clots,left work early Monday.Period cycle varies.  Review of Systems  Patient denies any headaches, hearing loss, fatigue, blurred vision, shortness of breath, chest pain, abdominal pain, problems with bowel movements,  or intercourse. No joint pain. See HPI for positives.  Reviewed past medical,surgical, social and family history. Reviewed medications and allergies.     Objective:   Physical Exam BP 102/72 mmHg  Pulse 78  Ht 5\' 4"  (1.626 m)  Wt 189 lb (85.73 kg)  BMI 32.43 kg/m2  LMP 02/21/2016UPT negative,Skin warm and dry.Pelvic: external genitalia is normal in appearance no lesions, vagina: tan discharge,urethra has no lesions or masses noted, cervix:smooth and bulbous, uterus: normal size, shape and contour, mildly tender no masses felt,adenxa, no masses, tender LLQ,Bladder is non tender, no masses felt, GC/CHL sent   Discussed   US to assess uterus and ovaries and labs and could try HRT or ablation depending on results.  Assessment:     Menorragia Night sweats Pelvic pain     Plan:    GC/CHL Check CBC,CMP,TSH and FSH Return in 1 week for US and then see me 2 days later to discuss options Review handout on peri menopause,menorrhagia and ablation

## 2014-04-26 ENCOUNTER — Telehealth: Payer: Self-pay | Admitting: Adult Health

## 2014-04-26 LAB — COMPREHENSIVE METABOLIC PANEL
A/G RATIO: 1.9 (ref 1.1–2.5)
ALBUMIN: 4.4 g/dL (ref 3.5–5.5)
ALT: 9 IU/L (ref 0–32)
AST: 17 IU/L (ref 0–40)
Alkaline Phosphatase: 58 IU/L (ref 39–117)
BILIRUBIN TOTAL: 0.2 mg/dL (ref 0.0–1.2)
BUN/Creatinine Ratio: 12 (ref 8–20)
BUN: 10 mg/dL (ref 6–20)
CALCIUM: 9.2 mg/dL (ref 8.7–10.2)
CHLORIDE: 102 mmol/L (ref 97–108)
CO2: 20 mmol/L (ref 18–29)
Creatinine, Ser: 0.85 mg/dL (ref 0.57–1.00)
GFR calc non Af Amer: 87 mL/min/{1.73_m2} (ref >59)
GFR, EST AFRICAN AMERICAN: 100 mL/min/{1.73_m2} (ref >59)
GLOBULIN, TOTAL: 2.3 g/dL (ref 1.5–4.5)
GLUCOSE: 96 mg/dL (ref 65–99)
POTASSIUM: 4.7 mmol/L (ref 3.5–5.2)
Sodium: 140 mmol/L (ref 134–144)
TOTAL PROTEIN: 6.7 g/dL (ref 6.0–8.5)

## 2014-04-26 LAB — CBC
HCT: 40.3 % (ref 34.0–46.6)
HEMOGLOBIN: 13.4 g/dL (ref 11.1–15.9)
MCH: 30.4 pg (ref 26.6–33.0)
MCHC: 33.3 g/dL (ref 31.5–35.7)
MCV: 91 fL (ref 79–97)
Platelets: 383 10*3/uL — ABNORMAL HIGH (ref 150–379)
RBC: 4.41 x10E6/uL (ref 3.77–5.28)
RDW: 12.5 % (ref 12.3–15.4)
WBC: 7.6 10*3/uL (ref 3.4–10.8)

## 2014-04-26 LAB — GC/CHLAMYDIA PROBE AMP
CHLAMYDIA, DNA PROBE: NEGATIVE
Neisseria gonorrhoeae by PCR: NEGATIVE

## 2014-04-26 LAB — TSH: TSH: 0.691 u[IU]/mL (ref 0.450–4.500)

## 2014-04-26 LAB — FOLLICLE STIMULATING HORMONE: FSH: 6.1 m[IU]/mL

## 2014-04-26 NOTE — Telephone Encounter (Signed)
Pt aware labs normal  

## 2014-05-01 ENCOUNTER — Ambulatory Visit (INDEPENDENT_AMBULATORY_CARE_PROVIDER_SITE_OTHER): Payer: 59

## 2014-05-01 ENCOUNTER — Other Ambulatory Visit: Payer: Self-pay | Admitting: Adult Health

## 2014-05-01 DIAGNOSIS — R102 Pelvic and perineal pain: Secondary | ICD-10-CM

## 2014-05-01 DIAGNOSIS — N921 Excessive and frequent menstruation with irregular cycle: Secondary | ICD-10-CM

## 2014-05-04 ENCOUNTER — Ambulatory Visit (INDEPENDENT_AMBULATORY_CARE_PROVIDER_SITE_OTHER): Payer: 59 | Admitting: Adult Health

## 2014-05-04 ENCOUNTER — Encounter: Payer: Self-pay | Admitting: Adult Health

## 2014-05-04 VITALS — BP 102/72 | HR 78 | Ht 64.0 in | Wt 189.0 lb

## 2014-05-04 DIAGNOSIS — N921 Excessive and frequent menstruation with irregular cycle: Secondary | ICD-10-CM

## 2014-05-04 DIAGNOSIS — R102 Pelvic and perineal pain: Secondary | ICD-10-CM

## 2014-05-04 NOTE — Progress Notes (Signed)
Subjective:     Patient ID: Kathleen Stewart, female   DOB: 01/18/1976, 39 y.o.   MRN: [id number]  HPI Kathleen Stewart is a 39 year old white female in complaining of heavy irregular periods and pain.She has had labs and US and is ready to discuss treatment options.  Review of Systems +heavy irregular periods +pelvic pain +hot flashes, all other systems negative Reviewed past medical,surgical, social and family history. Reviewed medications and allergies.     Objective:   Physical Exam BP 102/72 mmHg  Pulse 78  Ht 5\' 4"  (1.626 m)  Wt 189 lb (85.73 kg)  BMI 32.43 kg/m2  LMP 02/21/2016Skin warm and dry, abdomen soft, non tender and no HSM, no pelvic pain on palpation.Reviewed labs and US with pt. HGB 13.4,TSH 0.691,FSH 6.1 ad CMP normal, GC/CHL negative.   Uterus Anteverted 7.5 x 5.3 x 4.0 cm, no myometrial masses noted within  Endometrium 7.9 mm, symmetrical, proliferative phase  Right ovary 2.6 x 2.0 x 1.9 cm,   Left ovary 3.4 x 2.5 x 1.9 cm, with 1.6cm simple follicular cyst   No free fluid or adnexal masses noted within the pelvis  Technician Comments:  Anteverted uterus, Endom-7.589mm symmetrical, bilateral adnexa/ovaries appear WNL, no free fluid or adnexal masses noted within the pelvis Discussed hormones,IUD and she declines, discussed ablation and she wants it.Husband had vasectomy. Will schedule pre op nest week with Dr Kathleen Stewart.Face time 15 minutes with 50% counseling.  Assessment:     Menorrhagia Pelvic pain     Plan:     Return 3/8 for pre op with Dr Kathleen Stewart Review handout on endometrial ablation

## 2014-05-04 NOTE — Patient Instructions (Signed)
Endometrial Ablation Endometrial ablation removes the lining of the uterus (endometrium). It is usually a same-day, outpatient treatment. Ablation helps avoid major surgery, such as surgery to remove the cervix and uterus (hysterectomy). After endometrial ablation, you will have little or no menstrual bleeding and may not be able to have children. However, if you are premenopausal, you will need to use a reliable method of birth control following the procedure because of the small chance that pregnancy can occur. There are different reasons to have this procedure, which include:  Heavy periods.  Bleeding that is causing anemia.  Irregular bleeding.  Bleeding fibroids on the lining inside the uterus if they are smaller than 3 centimeters. This procedure should not be done if:  You want children in the future.  You have severe cramps with your menstrual period.  You have precancerous or cancerous cells in your uterus.  You were recently pregnant.  You have gone through menopause.  You have had major surgery on the uterus, such as a cesarean delivery. LET [facility] IncYOUR HEALTH CARE PROVIDER KNOW ABOUT:  Any allergies you have.  All medicines you are taking, including vitamins, herbs, eye drops, creams, and over-the-counter medicines.  Previous problems you or members of your family have had with the use of anesthetics.  Any blood disorders you have.  Previous surgeries you have had.  Medical conditions you have. RISKS AND COMPLICATIONS  Generally, this is a safe procedure. However, as with any procedure, complications can occur. Possible complications include:  Perforation of the uterus.  Bleeding.  Infection of the uterus, bladder, or vagina.  Injury to surrounding organs.  An air bubble to the lung (air embolus).  Pregnancy following the procedure.  Failure of the procedure to help the problem, requiring hysterectomy.  Decreased ability to diagnose cancer in the lining of  the uterus. BEFORE THE PROCEDURE  The lining of the uterus must be tested to make sure there is no pre-cancerous or cancer cells present.  An ultrasound may be performed to look at the size of the uterus and to check for abnormalities.  Medicines may be given to thin the lining of the uterus. PROCEDURE  During the procedure, your health care provider will use a tool called a resectoscope to help see inside your uterus. There are different ways to remove the lining of your uterus.   Radiofrequency - This method uses a radiofrequency-alternating electric current to remove the lining of the uterus.  Cryotherapy - This method uses extreme cold to freeze the lining of the uterus.  Heated-Free Liquid - This method uses heated salt (saline) solution to remove the lining of the uterus.  Microwave - This method uses high-energy microwaves to heat up the lining of the uterus to remove it.  Thermal balloon - This method involves inserting a catheter with a balloon tip into the uterus. The balloon tip is filled with heated fluid to remove the lining of the uterus. AFTER THE PROCEDURE  After your procedure, do not have sexual intercourse or insert anything into your vagina until permitted by your health care provider. After the procedure, you may experience:  Cramps.  Vaginal discharge.  Frequent urination. Document Released: 12/27/2003 Document Revised: 10/19/2012 Document Reviewed: 07/20/2012 [facility] Patient Information 2015 [place], [place]. This information is not intended to replace advice given to you by your health care provider. Make sure you discuss any questions you have with your health care provider. Return in 1 week for pre op with Dr Emelda FearFerguson

## 2014-05-08 ENCOUNTER — Encounter: Payer: Self-pay | Admitting: Obstetrics and Gynecology

## 2014-05-08 ENCOUNTER — Ambulatory Visit (INDEPENDENT_AMBULATORY_CARE_PROVIDER_SITE_OTHER): Payer: 59 | Admitting: Obstetrics and Gynecology

## 2014-05-08 VITALS — BP 120/80 | Ht 64.0 in | Wt 187.5 lb

## 2014-05-08 DIAGNOSIS — Z01818 Encounter for other preprocedural examination: Secondary | ICD-10-CM

## 2014-05-08 DIAGNOSIS — N920 Excessive and frequent menstruation with regular cycle: Secondary | ICD-10-CM

## 2014-05-08 NOTE — Progress Notes (Signed)
Patient ID: Kathleen Stewart, female   DOB: 06-Nov-1975, 39 y.o.   MRN: [id number] Pt here today for pre op visit. Pt states that she wants to discuss an endometrial ablation and get it scheduled. Patient has had several months of heavy periods up to 8 day cycles. We discussed IUD use as an option and  She is unwilling to use an IUD to control the periods. Preoperative History and Physical  Kathleen Stewart is a 39 y.o. [phone] here for surgical management of heavy menses, that have become irregular x 3 months. Pt is a smoker 1 ppd. Not OCP candidated.   No significant preoperative concerns. Wets self prn when coughing.  Proposed surgery: HYsteroscopy Dilation and curettage endometrial ablation  Past Medical History  Diagnosis Date  . Chronic diarrhea   . Bloating   . Abdominal pain   . Irritable bowel syndrome   . Gastroesophageal reflux   . Abnormal Pap smear   . Insomnia   . History of abnormal Pap smear 10/19/2012  . Depression   . Anxiety   . Vaginal Pap smear, abnormal   . History of abnormal cervical Pap smear 10/25/2013  . Menorrhagia 04/25/2014  . Night sweats 04/25/2014  . Pelvic pain in female 04/25/2014   Past Surgical History  Procedure Laterality Date  . Hydrogen breath test  01/29/2009  . Upper gastrointestinal endoscopy  12/24/2008  . Colonoscopy  12/24/2008   OB History  Gravida Para Term Preterm AB SAB TAB Ectopic Multiple Living  # Outcome Date GA Lbr Len/2nd Weight Sex Delivery Anes PTL Lv  5 Para     M Vag-Spont   Y  4 Para     M Vag-Spont   Y  3 TAB           2 TAB           1 SAB             Patient denies any other pertinent gynecologic issues.   Current Outpatient Prescriptions on File Prior to Visit  Medication Sig Dispense Refill  . Alpha-D-Galactosidase (BEANO) TABS Take by mouth as needed.    . ALPRAZolam (XANAX) 0.5 MG tablet Take 0.5 mg by mouth at bedtime as needed for sleep.    . Escitalopram Oxalate (LEXAPRO PO) Take by  mouth daily.    Marland Kitchen HYDROcodone-acetaminophen (LORCET) 10-650 MG per tablet Take 1 tablet by mouth every 6 (six) hours as needed.      Marland Kitchen omeprazole (PRILOSEC) 20 MG capsule Take 20 mg by mouth daily.      . Probiotic Product (TRUBIOTICS PO) Take by mouth daily.     No current facility-administered medications on file prior to visit.   No Known Allergies  Social History:   reports that she has been smoking Cigarettes.  She has a 20 pack-year smoking history. She has never used smokeless tobacco. She reports that she drinks alcohol. She reports that she does not use illicit drugs.  Family History  Problem Relation Age of Onset  . Emphysema Father   . Other Mother     fibroid tumors-had hyst    Review of Systems: Noncontributory  PHYSICAL EXAM: Blood pressure 120/80, height  (1.626 m), weight 187 lb 8 oz (85.049 kg), last menstrual period 04/22/2014. General appearance - alert, well appearing, and in no distress Chest - clear to auscultation, no wheezes, rales or rhonchi, symmetric air  entry Heart - normal rate and regular rhythm Abdomen - soft, nontender, nondistended, no masses or organomegaly Pelvic : Normal external genitalia good vaginal support multiparous cervix well supported uterus anterior mobile nontender. See ultrasound report for details adnexa negative Extremities - peripheral pulses normal, no pedal edema, no clubbing or cyanosis  Labs: Results for orders placed or performed in visit on 04/25/14 (from the past 336 hour(s))  POCT urine pregnancy   Collection Time: 04/25/14 12:03 PM  Result Value Ref Range   Preg Test, Ur Negative   Follicle stimulating hormone   Collection Time: 04/25/14 12:30 PM  Result Value Ref Range   FSH 6.1 mIU/mL  TSH   Collection Time: 04/25/14 12:30 PM  Result Value Ref Range   TSH 0.691 0.450 - 4.500 uIU/mL  CBC   Collection Time: 04/25/14 12:30 PM  Result Value Ref Range   WBC 7.6 3.4 - 10.8 x10E3/uL   RBC 4.41 3.77 - 5.28  x10E6/uL   Hemoglobin 13.4 11.1 - 15.9 g/dL   HCT 13.040.3 86.534.0 - 78.446.6 %   MCV 91 79 - 97 fL   MCH 30.4 26.6 - 33.0 pg   MCHC 33.3 31.5 - 35.7 g/dL   RDW 69.612.5 29.512.3 - 28.415.4 %   Platelets 383 (H) 150 - 379 x10E3/uL  Comprehensive metabolic panel   Collection Time: 04/25/14 12:30 PM  Result Value Ref Range   Glucose 96 65 - 99 mg/dL   BUN 10 6 - 20 mg/dL   Creatinine, Ser 1.320.85 0.57 - 1.00 mg/dL   GFR calc non Af Amer 87 >59 mL/min/1.73   GFR calc Af Amer 100 >59 mL/min/1.73   BUN/Creatinine Ratio 12 8 - 20   Sodium 140 134 - 144 mmol/L   Potassium 4.7 3.5 - 5.2 mmol/L   Chloride 102 97 - 108 mmol/L   CO2 20 18 - 29 mmol/L   Calcium 9.2 8.7 - 10.2 mg/dL   Total Protein 6.7 6.0 - 8.5 g/dL   Albumin 4.4 3.5 - 5.5 g/dL   Globulin, Total 2.3 1.5 - 4.5 g/dL   Albumin/Globulin Ratio 1.9 1.1 - 2.5   Bilirubin Total 0.2 0.0 - 1.2 mg/dL   Alkaline Phosphatase 58 39 - 117 IU/L   AST 17 0 - 40 IU/L   ALT 9 0 - 32 IU/L  GC/Chlamydia Probe Amp   Collection Time: 04/25/14  1:06 PM  Result Value Ref Range   Chlamydia trachomatis, NAA Negative Negative   Neisseria gonorrhoeae by PCR Negative Negative   PLEASE NOTE: Comment     Imaging Studies: [place] Transvaginal Non-ob  05/01/2014   GYNECOLOGIC SONOGRAM   Kathleen Stewart is a 39 y.o. [id number] LMP 04/22/2014 for a pelvic sonogram  for menorrhagia and pelvic pain noted x ~353months per pt. Pt not using any  OCP's at the time. Pt is on day 10 of her cycle.  Uterus                      Anteverted 7.5 x 5.3 x 4.0 cm, no myometrial  masses noted within  Endometrium          7.9 mm, symmetrical, proliferative phase  Right ovary             2.6 x 2.0 x 1.9 cm,   Left ovary                3.4 x 2.5 x 1.9 cm, with 1.6cm simple follicular  cyst  No free fluid or adnexal masses noted within the pelvis  Technician Comments:  Anteverted uterus, Endom-7.2mm symmetrical, bilateral adnexa/ovaries  appear WNL, no free fluid or adnexal masses noted within the pelvis    Chari Manning 05/01/2014 2:47 PM  Clinical Impression and recommendations:  I have reviewed the sonogram results above, combined with the patient's  current clinical course, below are my impressions and any appropriate  recommendations for management based on the sonographic findings.  Normal uterus with masses with appropriate endometrial width for age and  Day 10 of cycle Right ovary normal Left ovary with follicular cyst again appropriate for Day 10 of cycle   EURE,LUTHER H 05/01/2014 2:54 PM       Assessment: Patient Active Problem List   Diagnosis Date Noted  . Menorrhagia 04/25/2014  . Night sweats 04/25/2014  . Pelvic pain in female 04/25/2014  . History of abnormal cervical Pap smear 10/25/2013  . History of abnormal Pap smear 10/19/2012  . ABDOMINAL BLOATING 04/12/2009  . IRRITABLE BOWEL SYNDROME 01/02/2009  . ANXIETY 11/30/2008  . GERD 11/30/2008  . BACK PAIN, CHRONIC 11/30/2008  . DIARRHEA, CHRONIC 11/30/2008  . ABDOMINAL PAIN 11/30/2008  . FRACTURE, PELVIS, RIGHT 11/30/2008    Plan: Patient will undergo surgical management with hysteroscopy, dilation and curettage.endometrial ablation.  Will schedule through Samule Dry   05/08/2014 3:33 PM

## 2014-05-08 NOTE — Patient Instructions (Signed)
Endometrial Ablation Endometrial ablation removes the lining of the uterus (endometrium). It is usually a same-day, outpatient treatment. Ablation helps avoid major surgery, such as surgery to remove the cervix and uterus (hysterectomy). After endometrial ablation, you will have little or no menstrual bleeding and may not be able to have children. However, if you are premenopausal, you will need to use a reliable method of birth control following the procedure because of the small chance that pregnancy can occur. There are different reasons to have this procedure, which include:  Heavy periods.  Bleeding that is causing anemia.  Irregular bleeding.  Bleeding fibroids on the lining inside the uterus if they are smaller than 3 centimeters. This procedure should not be done if:  You want children in the future.  You have severe cramps with your menstrual period.  You have precancerous or cancerous cells in your uterus.  You were recently pregnant.  You have gone through menopause.  You have had major surgery on the uterus, such as a cesarean delivery. LET YOUR HEALTH CARE PROVIDER KNOW ABOUT:  Any allergies you have.  All medicines you are taking, including vitamins, herbs, eye drops, creams, and over-the-counter medicines.  Previous problems you or members of your family have had with the use of anesthetics.  Any blood disorders you have.  Previous surgeries you have had.  Medical conditions you have. RISKS AND COMPLICATIONS  Generally, this is a safe procedure. However, as with any procedure, complications can occur. Possible complications include:  Perforation of the uterus.  Bleeding.  Infection of the uterus, bladder, or vagina.  Injury to surrounding organs.  An air bubble to the lung (air embolus).  Pregnancy following the procedure.  Failure of the procedure to help the problem, requiring hysterectomy.  Decreased ability to diagnose cancer in the lining of  the uterus. BEFORE THE PROCEDURE  The lining of the uterus must be tested to make sure there is no pre-cancerous or cancer cells present.  An ultrasound may be performed to look at the size of the uterus and to check for abnormalities.  Medicines may be given to thin the lining of the uterus. PROCEDURE  During the procedure, your health care provider will use a tool called a resectoscope to help see inside your uterus. There are different ways to remove the lining of your uterus.   Radiofrequency - This method uses a radiofrequency-alternating electric current to remove the lining of the uterus.  Cryotherapy - This method uses extreme cold to freeze the lining of the uterus.  Heated-Free Liquid - This method uses heated salt (saline) solution to remove the lining of the uterus.  Microwave - This method uses high-energy microwaves to heat up the lining of the uterus to remove it.  Thermal balloon - This method involves inserting a catheter with a balloon tip into the uterus. The balloon tip is filled with heated fluid to remove the lining of the uterus. AFTER THE PROCEDURE  After your procedure, do not have sexual intercourse or insert anything into your vagina until permitted by your health care provider. After the procedure, you may experience:  Cramps.  Vaginal discharge.  Frequent urination. Document Released: 12/27/2003 Document Revised: 10/19/2012 Document Reviewed: 07/20/2012 [employer] Patient Information 2015 [employer]. This information is not intended to replace advice given to you by your health care provider. Make sure you discuss any questions you have with your health care provider.  

## 2014-05-09 NOTE — Progress Notes (Signed)
Scheduled [facility] per Dr. Emelda FearFerguson request 05-23-14

## 2014-05-18 NOTE — Patient Instructions (Signed)
Kathleen Stewart  05/18/2014   Your procedure is scheduled on:  05/22/2014  [facility] at [facility] AM.  Call this number if you have problems the morning of surgery: [phone]   Remember:   Do not eat food or drink liquids after midnight.   Take these medicines the morning of surgery with A SIP OF WATER: Xanax, Lexapro, Hydrocodone, Prilosec, Zofran   Do not wear jewelry, make-up or nail polish.  Do not wear lotions, powders, or perfumes. You may wear deodorant.  Do not shave 48 hours prior to surgery. Men may shave face and neck.  Do not bring valuables to the hospital.  [facility] any belongings or valuables.               Contacts, dentures or bridgework may not be worn into surgery.  Leave suitcase in the car. After surgery it may be brought to your room.  For patients admitted to the hospital, discharge time is determined by your treatment team.               Patients discharged the day of surgery will not be allowed to drive home.  Name and phone number of your driver:   Special Instructions: Shower using CHG 2 nights before surgery and the night before surgery.  If you shower the day of surgery use CHG.  Use special wash - you have one bottle of CHG for all showers.  You should use approximately 1/3 of the bottle for each shower.   Please read over the following fact sheets that you were given: Surgical Site Infection Prevention and Anesthesia Post-op Instructions   PATIENT INSTRUCTIONS POST-ANESTHESIA  IMMEDIATELY FOLLOWING SURGERY:  Do not drive or operate machinery for the first twenty four hours after surgery.  Do not make any important decisions for twenty four hours after surgery or while taking narcotic pain medications or sedatives.  If you develop intractable nausea and vomiting or a severe headache please notify your doctor immediately.  FOLLOW-UP:  Please make an appointment with your surgeon as instructed. You do not need to follow up  with anesthesia unless specifically instructed to do so.  WOUND CARE INSTRUCTIONS (if applicable):  Keep a dry clean dressing on the anesthesia/puncture wound site if there is drainage.  Once the wound has quit draining you may leave it open to air.  Generally you should leave the bandage intact for twenty four hours unless there is drainage.  If the epidural site drains for more than 36-48 hours please call the anesthesia department.  QUESTIONS?:  Please feel free to call your physician or the hospital operator if you have any questions, and they will be happy to assist you.      Dilation and Curettage or Vacuum Curettage Dilation and curettage (D&C) and vacuum curettage are minor procedures. A D&C involves stretching (dilation) the cervix and scraping (curettage) the inside lining of the womb (uterus). During a D&C, tissue is gently scraped from the inside lining of the uterus. During a vacuum curettage, the lining and tissue in the uterus are removed with the use of gentle suction.  Curettage may be performed to either diagnose or treat a problem. As a diagnostic procedure, curettage is performed to examine tissues from the uterus. A diagnostic curettage may be performed for the following symptoms:   Irregular bleeding in the uterus.   Bleeding with the development of clots.   Spotting between menstrual periods.   Prolonged  menstrual periods.   Bleeding after menopause.   No menstrual period (amenorrhea).   A change in size and shape of the uterus.  As a treatment procedure, curettage may be performed for the following reasons:   Removal of an IUD (intrauterine device).   Removal of retained placenta after giving birth. Retained placenta can cause an infection or bleeding severe enough to require transfusions.   Abortion.   Miscarriage.   Removal of polyps inside the uterus.   Removal of uncommon types of noncancerous lumps (fibroids).  LET [facility] CARE  PROVIDER KNOW ABOUT:   Any allergies you have.   All medicines you are taking, including vitamins, herbs, eye drops, creams, and over-the-counter medicines.   Previous problems you or members of your family have had with the use of anesthetics.   Any blood disorders you have.   Previous surgeries you have had.   Medical conditions you have. RISKS AND COMPLICATIONS  Generally, this is a safe procedure. However, as with any procedure, complications can occur. Possible complications include:  Excessive bleeding.   Infection of the uterus.   Damage to the cervix.   Development of scar tissue (adhesions) inside the uterus, later causing abnormal amounts of menstrual bleeding.   Complications from the general anesthetic, if a general anesthetic is used.   Putting a hole (perforation) in the uterus. This is rare.  BEFORE THE PROCEDURE   Eat and drink before the procedure only as directed by your health care provider.   Arrange for someone to take you home.  PROCEDURE  This procedure usually takes about 15-30 minutes.  You will be given one of the following:  A medicine that numbs the area in and around the cervix (local anesthetic).   A medicine to make you sleep through the procedure (general anesthetic).  You will lie on your back with your legs in stirrups.   A warm metal or plastic instrument (speculum) will be placed in your vagina to keep it open and to allow the health care provider to see the cervix.  There are two ways in which your cervix can be softened and dilated. These include:   Taking a medicine.   Having thin rods (laminaria) inserted into your cervix.   A curved tool (curette) will be used to scrape cells from the inside lining of the uterus. In some cases, gentle suction is applied with the curette. The curette will then be removed.  AFTER THE PROCEDURE   You will rest in the recovery area until you are stable and are ready to go  home.   You may feel sick to your stomach (nauseous) or throw up (vomit) if you were given a general anesthetic.   You may have a sore throat if a tube was placed in your throat during general anesthesia.   You may have light cramping and bleeding. This may last for 2 days to 2 weeks after the procedure.   Your uterus needs to make a new lining after the procedure. This may make your next period late. Document Released: 02/16/2005 Document Revised: 10/19/2012 Document Reviewed: 09/15/2012 [facility] Patient Information 2015 [place], [place]. This information is not intended to replace advice given to you by your health care provider. Make sure you discuss any questions you have with your health care provider.

## 2014-05-21 ENCOUNTER — Other Ambulatory Visit: Payer: Self-pay | Admitting: Obstetrics and Gynecology

## 2014-05-21 ENCOUNTER — Other Ambulatory Visit (HOSPITAL_COMMUNITY): Payer: Self-pay

## 2014-05-21 ENCOUNTER — Encounter (HOSPITAL_COMMUNITY)
Admission: RE | Admit: 2014-05-21 | Discharge: 2014-05-21 | Disposition: A | Discharge status: Home / Self Care | Payer: 59 | Source: Ambulatory Visit | Attending: Obstetrics and Gynecology | Admitting: Obstetrics and Gynecology

## 2014-05-21 ENCOUNTER — Encounter (HOSPITAL_COMMUNITY): Payer: Self-pay

## 2014-05-21 DIAGNOSIS — N920 Excessive and frequent menstruation with regular cycle: Secondary | ICD-10-CM

## 2014-05-21 DIAGNOSIS — K219 Gastro-esophageal reflux disease without esophagitis: Secondary | ICD-10-CM | POA: Diagnosis not present

## 2014-05-21 DIAGNOSIS — M545 Low back pain: Secondary | ICD-10-CM | POA: Diagnosis not present

## 2014-05-21 DIAGNOSIS — F17210 Nicotine dependence, cigarettes, uncomplicated: Secondary | ICD-10-CM | POA: Diagnosis not present

## 2014-05-21 DIAGNOSIS — F419 Anxiety disorder, unspecified: Secondary | ICD-10-CM | POA: Diagnosis not present

## 2014-05-21 DIAGNOSIS — F329 Major depressive disorder, single episode, unspecified: Secondary | ICD-10-CM | POA: Diagnosis not present

## 2014-05-21 DIAGNOSIS — G8929 Other chronic pain: Secondary | ICD-10-CM | POA: Diagnosis not present

## 2014-05-21 HISTORY — DX: Other intervertebral disc degeneration, lumbar region: M51.36

## 2014-05-21 HISTORY — DX: Other intervertebral disc degeneration, lumbar region without mention of lumbar back pain or lower extremity pain: M51.369

## 2014-05-21 HISTORY — DX: Other chronic pain: G89.29

## 2014-05-21 HISTORY — DX: Dorsalgia, unspecified: M54.9

## 2014-05-21 HISTORY — DX: Other intervertebral disc displacement, lumbar region: M51.26

## 2014-05-21 LAB — BASIC METABOLIC PANEL
Anion gap: 7 (ref 5–15)
BUN: 10 mg/dL (ref 6–23)
CO2: 25 mmol/L (ref 19–32)
Calcium: 8.8 mg/dL (ref 8.4–10.5)
Chloride: 106 mmol/L (ref 96–112)
Creatinine, Ser: 0.81 mg/dL (ref 0.50–1.10)
GFR calc Af Amer: >90 mL/min (ref >90)
GFR calc non Af Amer: >90 mL/min (ref >90)
Glucose, Bld: 97 mg/dL (ref 70–99)
Potassium: 4 mmol/L (ref 3.5–5.1)
Sodium: 138 mmol/L (ref 135–145)

## 2014-05-21 LAB — CBC
HCT: 36.8 % (ref 36.0–46.0)
Hemoglobin: 12.2 g/dL (ref 12.0–15.0)
MCH: 31.3 pg (ref 26.0–34.0)
MCHC: 33.2 g/dL (ref 30.0–36.0)
MCV: 94.4 fL (ref 78.0–100.0)
Platelets: 321 10*3/uL (ref 150–400)
RBC: 3.9 MIL/uL (ref 3.87–5.11)
RDW: 12.6 % (ref 11.5–15.5)
WBC: 7.3 10*3/uL (ref 4.0–10.5)

## 2014-05-21 LAB — URINALYSIS, ROUTINE W REFLEX MICROSCOPIC
Bilirubin Urine: NEGATIVE
Glucose, UA: NEGATIVE mg/dL
Ketones, ur: NEGATIVE mg/dL
LEUKOCYTES UA: NEGATIVE
NITRITE: NEGATIVE
Protein, ur: NEGATIVE mg/dL
Specific Gravity, Urine: 1.02 (ref 1.005–1.030)
UROBILINOGEN UA: 0.2 mg/dL (ref 0.0–1.0)
pH: 6.5 (ref 5.0–8.0)

## 2014-05-21 LAB — URINE MICROSCOPIC-ADD ON

## 2014-05-21 LAB — HCG, SERUM, QUALITATIVE: Preg, Serum: NEGATIVE

## 2014-05-21 NOTE — H&P (Signed)
Progress Notes    Expand All Collapse All   Patient ID: [place] CARMACK, female DOB: 04-Jun-1975, 39 y.o. MRN: [id number] Pt here today for pre op visit. Pt states that she wants to discuss an endometrial ablation and get it scheduled. Patient has had several months of heavy periods up to 8 day cycles. We discussed IUD use as an option and  She is unwilling to use an IUD to control the periods. Preoperative History and Physical  LELAH RENNAKER is a 39 y.o. [phone] here for surgical management of heavy menses, that have become irregular x 3 months. Pt is a smoker 1 ppd. Not OCP candidated. No significant preoperative concerns. Wets self prn when coughing.  Proposed surgery: HYsteroscopy Dilation and curettage endometrial ablation  Past Medical History  Diagnosis Date  . Chronic diarrhea   . Bloating   . Abdominal pain   . Irritable bowel syndrome   . Gastroesophageal reflux   . Abnormal Pap smear   . Insomnia   . History of abnormal Pap smear 10/19/2012  . Depression   . Anxiety   . Vaginal Pap smear, abnormal   . History of abnormal cervical Pap smear 10/25/2013  . Menorrhagia 04/25/2014  . Night sweats 04/25/2014  . Pelvic pain in female 04/25/2014   Past Surgical History  Procedure Laterality Date  . Hydrogen breath test  01/29/2009  . Upper gastrointestinal endoscopy  12/24/2008  . Colonoscopy  12/24/2008   OB History  Gravida Para Term Preterm AB SAB TAB Ectopic Multiple Living  # Outcome Date GA Lbr Len/2nd Weight Sex Delivery Anes PTL Lv  5 Para     M Vag-Spont   Y  4 Para     M Vag-Spont   Y  3 TAB           2 TAB           1 SAB             Patient denies any other pertinent gynecologic issues.   Current Outpatient Prescriptions on File Prior to Visit  Medication Sig Dispense  Refill  . Alpha-D-Galactosidase (BEANO) TABS Take by mouth as needed.    . ALPRAZolam (XANAX) 0.5 MG tablet Take 0.5 mg by mouth at bedtime as needed for sleep.    . Escitalopram Oxalate (LEXAPRO PO) Take by mouth daily.    Marland Kitchen HYDROcodone-acetaminophen (LORCET) 10-650 MG per tablet Take 1 tablet by mouth every 6 (six) hours as needed.     Marland Kitchen omeprazole (PRILOSEC) 20 MG capsule Take 20 mg by mouth daily.     . Probiotic Product (TRUBIOTICS PO) Take by mouth daily.     No current facility-administered medications on file prior to visit.   No Known Allergies  Social History:  reports that she has been smoking Cigarettes. She has a 20 pack-year smoking history. She has never used smokeless tobacco. She reports that she drinks alcohol. She reports that she does not use illicit drugs.  Family History  Problem Relation Age of Onset  . Emphysema Father   . Other Mother     fibroid tumors-had hyst    Review of Systems: Noncontributory  PHYSICAL EXAM: Blood pressure 120/80, height  (1.626 m), weight 187 lb 8 oz (85.049 kg), last menstrual period 04/22/2014. General appearance - alert, well appearing, and in no distress Chest - clear to auscultation, no wheezes, rales or rhonchi, symmetric  air entry Heart - normal rate and regular rhythm Abdomen - soft, nontender, nondistended, no masses or organomegaly Pelvic : Normal external genitalia good vaginal support multiparous cervix well supported uterus anterior mobile nontender. See ultrasound report for details adnexa negative Extremities - peripheral pulses normal, no pedal edema, no clubbing or cyanosis  Labs: Results for orders placed or performed in visit on 04/25/14 (from the past 336 hour(s))  POCT urine pregnancy   Collection Time: 04/25/14 12:03 PM  Result Value Ref Range   Preg Test, Ur Negative   Follicle stimulating hormone   Collection Time: 04/25/14 12:30 PM   Result Value Ref Range   FSH 6.1 mIU/mL  TSH   Collection Time: 04/25/14 12:30 PM  Result Value Ref Range   TSH 0.691 0.450 - 4.500 uIU/mL  CBC   Collection Time: 04/25/14 12:30 PM  Result Value Ref Range   WBC 7.6 3.4 - 10.8 x10E3/uL   RBC 4.41 3.77 - 5.28 x10E6/uL   Hemoglobin 13.4 11.1 - 15.9 g/dL   HCT 14.740.3 82.934.0 - 56.246.6 %   MCV 91 79 - 97 fL   MCH 30.4 26.6 - 33.0 pg   MCHC 33.3 31.5 - 35.7 g/dL   RDW 13.012.5 86.512.3 - 78.415.4 %   Platelets 383 (H) 150 - 379 x10E3/uL  Comprehensive metabolic panel   Collection Time: 04/25/14 12:30 PM  Result Value Ref Range   Glucose 96 65 - 99 mg/dL   BUN 10 6 - 20 mg/dL   Creatinine, Ser 6.960.85 0.57 - 1.00 mg/dL   GFR calc non Af Amer 87 >59 mL/min/1.73   GFR calc Af Amer 100 >59 mL/min/1.73   BUN/Creatinine Ratio 12 8 - 20   Sodium 140 134 - 144 mmol/L   Potassium 4.7 3.5 - 5.2 mmol/L   Chloride 102 97 - 108 mmol/L   CO2 20 18 - 29 mmol/L   Calcium 9.2 8.7 - 10.2 mg/dL   Total Protein 6.7 6.0 - 8.5 g/dL   Albumin 4.4 3.5 - 5.5 g/dL   Globulin, Total 2.3 1.5 - 4.5 g/dL   Albumin/Globulin Ratio 1.9 1.1 - 2.5   Bilirubin Total 0.2 0.0 - 1.2 mg/dL   Alkaline Phosphatase 58 39 - 117 IU/L   AST 17 0 - 40 IU/L   ALT 9 0 - 32 IU/L  GC/Chlamydia Probe Amp   Collection Time: 04/25/14 1:06 PM  Result Value Ref Range   Chlamydia trachomatis, NAA Negative Negative   Neisseria gonorrhoeae by PCR Negative Negative   PLEASE NOTE: Comment     Imaging Studies:  Imaging Results    [place] Transvaginal Non-ob  05/01/2014 GYNECOLOGIC SONOGRAM Raynelle DickJaime A Brauer is a 39 y.o. [id number] LMP 04/22/2014 for a pelvic sonogram for menorrhagia and pelvic pain noted x ~433months per pt. Pt not using any OCP's at the time. Pt is on day 10 of her cycle. Uterus Anteverted 7.5 x 5.3 x 4.0 cm, no  myometrial masses noted within Endometrium 7.9 mm, symmetrical, proliferative phase Right ovary 2.6 x 2.0 x 1.9 cm, Left ovary 3.4 x 2.5 x 1.9 cm, with 1.6cm simple follicular cyst No free fluid or adnexal masses noted within the pelvis Technician Comments: Anteverted uterus, Endom-7.819mm symmetrical, bilateral adnexa/ovaries appear WNL, no free fluid or adnexal masses noted within the pelvis Chari ManningMcBride, Tasha 05/01/2014 2:47 PM Clinical Impression and recommendations: I have reviewed the sonogram results above, combined with the patient's current clinical course, below are my impressions and any appropriate recommendations for management based  on the sonographic findings. Normal uterus with masses with appropriate endometrial width for age and Day 10 of cycle Right ovary normal Left ovary with follicular cyst again appropriate for Day 10 of cycle EURE,LUTHER H 05/01/2014 2:54 PM     Assessment: Patient Active Problem List   Diagnosis Date Noted  . Menorrhagia 04/25/2014  . Night sweats 04/25/2014  . Pelvic pain in female 04/25/2014  . History of abnormal cervical Pap smear 10/25/2013  . History of abnormal Pap smear 10/19/2012  . ABDOMINAL BLOATING 04/12/2009  . IRRITABLE BOWEL SYNDROME 01/02/2009  . ANXIETY 11/30/2008  . GERD 11/30/2008  . BACK PAIN, CHRONIC 11/30/2008  . DIARRHEA, CHRONIC 11/30/2008  . ABDOMINAL PAIN 11/30/2008  . FRACTURE, PELVIS, RIGHT 11/30/2008    Plan: Patient will undergo surgical management with hysteroscopy, dilation and curettage.endometrial ablation.

## 2014-05-21 NOTE — Pre-Procedure Instructions (Signed)
Patient given information to sign up for my chart at home. 

## 2014-05-22 ENCOUNTER — Ambulatory Visit (HOSPITAL_COMMUNITY): Payer: 59 | Admitting: Anesthesiology

## 2014-05-22 ENCOUNTER — Encounter (HOSPITAL_COMMUNITY)
Admission: RE | Disposition: A | Discharge status: Home / Self Care | Payer: Self-pay | Source: Ambulatory Visit | Attending: Obstetrics and Gynecology

## 2014-05-22 ENCOUNTER — Ambulatory Visit (HOSPITAL_COMMUNITY)
Admission: RE | Admit: 2014-05-22 | Discharge: 2014-05-22 | Disposition: A | Discharge status: Home / Self Care | Payer: 59 | Source: Ambulatory Visit | Attending: Obstetrics and Gynecology | Admitting: Obstetrics and Gynecology

## 2014-05-22 DIAGNOSIS — N920 Excessive and frequent menstruation with regular cycle: Secondary | ICD-10-CM | POA: Diagnosis not present

## 2014-05-22 DIAGNOSIS — F329 Major depressive disorder, single episode, unspecified: Secondary | ICD-10-CM | POA: Insufficient documentation

## 2014-05-22 DIAGNOSIS — F419 Anxiety disorder, unspecified: Secondary | ICD-10-CM | POA: Insufficient documentation

## 2014-05-22 DIAGNOSIS — N921 Excessive and frequent menstruation with irregular cycle: Secondary | ICD-10-CM | POA: Diagnosis not present

## 2014-05-22 DIAGNOSIS — M545 Low back pain: Secondary | ICD-10-CM | POA: Insufficient documentation

## 2014-05-22 DIAGNOSIS — K219 Gastro-esophageal reflux disease without esophagitis: Secondary | ICD-10-CM | POA: Insufficient documentation

## 2014-05-22 DIAGNOSIS — G8929 Other chronic pain: Secondary | ICD-10-CM | POA: Insufficient documentation

## 2014-05-22 DIAGNOSIS — F17210 Nicotine dependence, cigarettes, uncomplicated: Secondary | ICD-10-CM | POA: Insufficient documentation

## 2014-05-22 HISTORY — PX: DILITATION & CURRETTAGE/HYSTROSCOPY WITH NOVASURE ABLATION: SHX5568

## 2014-05-22 LAB — RPR: RPR Ser Ql: NONREACTIVE

## 2014-05-22 SURGERY — DILATATION & CURETTAGE/HYSTEROSCOPY WITH NOVASURE ABLATION
Anesthesia: General | Site: Vagina

## 2014-05-22 MED ORDER — BUPIVACAINE-EPINEPHRINE (PF) 0.5% -1:200000 IJ SOLN
INTRAMUSCULAR | Status: AC
  Filled 2014-05-22: qty 30

## 2014-05-22 MED ORDER — ONDANSETRON HCL 4 MG/2ML IJ SOLN
4.0000 mg | Freq: Once | INTRAMUSCULAR | Status: AC
  Administered 2014-05-22: 4 mg via INTRAVENOUS

## 2014-05-22 MED ORDER — FENTANYL CITRATE 0.05 MG/ML IJ SOLN
25.0000 ug | INTRAMUSCULAR | Status: AC
  Administered 2014-05-22 (×2): 25 ug via INTRAVENOUS

## 2014-05-22 MED ORDER — MIDAZOLAM HCL 2 MG/2ML IJ SOLN
1.0000 mg | INTRAMUSCULAR | Status: DC | PRN
  Administered 2014-05-22: 2 mg via INTRAVENOUS

## 2014-05-22 MED ORDER — KETOROLAC TROMETHAMINE 10 MG PO TABS: 10.0000 mg | ORAL_TABLET | Freq: Four times a day (QID) | ORAL | Status: DC | PRN

## 2014-05-22 MED ORDER — BUPIVACAINE-EPINEPHRINE (PF) 0.5% -1:200000 IJ SOLN
INTRAMUSCULAR | Status: DC | PRN
Start: 2014-05-22 — End: 2014-05-22
  Administered 2014-05-22: 30 mL via PERINEURAL

## 2014-05-22 MED ORDER — GLYCOPYRROLATE 0.2 MG/ML IJ SOLN
INTRAMUSCULAR | Status: AC
  Filled 2014-05-22: qty 1

## 2014-05-22 MED ORDER — DEXAMETHASONE SODIUM PHOSPHATE 4 MG/ML IJ SOLN
4.0000 mg | Freq: Once | INTRAMUSCULAR | Status: AC
  Administered 2014-05-22: 4 mg via INTRAVENOUS

## 2014-05-22 MED ORDER — FENTANYL CITRATE 0.05 MG/ML IJ SOLN
INTRAMUSCULAR | Status: DC | PRN
  Administered 2014-05-22: 100 ug via INTRAVENOUS

## 2014-05-22 MED ORDER — FENTANYL CITRATE 0.05 MG/ML IJ SOLN
INTRAMUSCULAR | Status: AC
Start: 2014-05-22 — End: 2014-05-22
  Filled 2014-05-22: qty 2

## 2014-05-22 MED ORDER — MIDAZOLAM HCL 2 MG/2ML IJ SOLN
INTRAMUSCULAR | Status: AC
  Filled 2014-05-22: qty 2

## 2014-05-22 MED ORDER — SODIUM CHLORIDE 0.9 % IR SOLN
Status: DC | PRN
  Administered 2014-05-22: 3000 mL

## 2014-05-22 MED ORDER — LACTATED RINGERS IV SOLN
INTRAVENOUS | Status: DC
  Administered 2014-05-22: 1000 mL via INTRAVENOUS
  Administered 2014-05-22: 11:00:00 via INTRAVENOUS

## 2014-05-22 MED ORDER — OXYCODONE-ACETAMINOPHEN 5-325 MG PO TABS: 1.0000 | ORAL_TABLET | Freq: Four times a day (QID) | ORAL | Status: DC | PRN

## 2014-05-22 MED ORDER — DEXAMETHASONE SODIUM PHOSPHATE 4 MG/ML IJ SOLN
INTRAMUSCULAR | Status: AC
  Filled 2014-05-22: qty 1

## 2014-05-22 MED ORDER — PROPOFOL 10 MG/ML IV BOLUS
INTRAVENOUS | Status: DC | PRN
  Administered 2014-05-22: 50 mg via INTRAVENOUS
  Administered 2014-05-22: 30 mg via INTRAVENOUS
  Administered 2014-05-22: 20 mg via INTRAVENOUS
  Administered 2014-05-22: 200 mg via INTRAVENOUS

## 2014-05-22 MED ORDER — HYDROCORTISONE 1 % EX CREA
TOPICAL_CREAM | Freq: Once | CUTANEOUS | Status: AC
  Administered 2014-05-22: 1 via TOPICAL
  Filled 2014-05-22: qty 1.5

## 2014-05-22 MED ORDER — LIDOCAINE HCL (PF) 1 % IJ SOLN
INTRAMUSCULAR | Status: AC
  Filled 2014-05-22: qty 5

## 2014-05-22 MED ORDER — CEFAZOLIN SODIUM-DEXTROSE 2-3 GM-% IV SOLR
2.0000 g | INTRAVENOUS | Status: AC
  Administered 2014-05-22: 2 g via INTRAVENOUS
  Filled 2014-05-22: qty 50

## 2014-05-22 MED ORDER — LIDOCAINE HCL (CARDIAC) 10 MG/ML IV SOLN
INTRAVENOUS | Status: DC | PRN
  Administered 2014-05-22: 50 mg via INTRAVENOUS

## 2014-05-22 MED ORDER — FENTANYL CITRATE 0.05 MG/ML IJ SOLN
25.0000 ug | INTRAMUSCULAR | Status: DC | PRN
Start: 2014-05-22 — End: 2014-05-22
  Administered 2014-05-22 (×3): 50 ug via INTRAVENOUS
  Filled 2014-05-22 (×2): qty 2

## 2014-05-22 MED ORDER — PROPOFOL 10 MG/ML IV BOLUS
INTRAVENOUS | Status: AC
  Filled 2014-05-22: qty 20

## 2014-05-22 MED ORDER — ONDANSETRON HCL 4 MG/2ML IJ SOLN
INTRAMUSCULAR | Status: AC
  Filled 2014-05-22: qty 2

## 2014-05-22 MED ORDER — ONDANSETRON HCL 4 MG/2ML IJ SOLN
4.0000 mg | Freq: Once | INTRAMUSCULAR | Status: AC | PRN
  Administered 2014-05-22: 4 mg via INTRAVENOUS
  Filled 2014-05-22: qty 2

## 2014-05-22 MED ORDER — FENTANYL CITRATE 0.05 MG/ML IJ SOLN
INTRAMUSCULAR | Status: AC
  Filled 2014-05-22: qty 2

## 2014-05-22 MED ORDER — GLYCOPYRROLATE 0.2 MG/ML IJ SOLN
0.2000 mg | Freq: Once | INTRAMUSCULAR | Status: AC
  Administered 2014-05-22: 0.2 mg via INTRAVENOUS

## 2014-05-22 SURGICAL SUPPLY — 31 items
ABLATOR ENDOMETRIAL BIPOLAR (ABLATOR) ×3 IMPLANT
BAG DECANTER FOR FLEXI CONT (MISCELLANEOUS) ×3 IMPLANT
BAG HAMPER (MISCELLANEOUS) ×3 IMPLANT
CATH ROBINSON RED A/P 16FR (CATHETERS) ×1 IMPLANT
CLOTH BEACON ORANGE TIMEOUT ST (SAFETY) ×3 IMPLANT
COVER LIGHT HANDLE STERIS (MISCELLANEOUS) ×6 IMPLANT
FORMALIN 10 PREFIL 120ML (MISCELLANEOUS) ×3 IMPLANT
GLOVE BIOGEL PI IND STRL 7.0 (GLOVE) IMPLANT
GLOVE BIOGEL PI IND STRL 9 (GLOVE) ×1 IMPLANT
GLOVE BIOGEL PI INDICATOR 7.0 (GLOVE) ×2
GLOVE BIOGEL PI INDICATOR 9 (GLOVE) ×2
GLOVE ECLIPSE 9.0 STRL (GLOVE) ×3 IMPLANT
GLOVE EXAM NITRILE LRG STRL (GLOVE) ×2 IMPLANT
GLOVE SS BIOGEL STRL SZ 6.5 (GLOVE) IMPLANT
GLOVE SUPERSENSE BIOGEL SZ 6.5 (GLOVE) ×2
GOWN SPEC L3 XXLG W/TWL (GOWN DISPOSABLE) ×3 IMPLANT
GOWN STRL REUS W/TWL LRG LVL3 (GOWN DISPOSABLE) ×3 IMPLANT
INST SET HYSTEROSCOPY (KITS) ×3 IMPLANT
IV NS 1000ML (IV SOLUTION) ×3
IV NS 1000ML BAXH (IV SOLUTION) ×1 IMPLANT
KIT ROOM TURNOVER AP CYSTO (KITS) ×3 IMPLANT
KIT ROOM TURNOVER APOR (KITS) ×1 IMPLANT
MANIFOLD NEPTUNE II (INSTRUMENTS) ×3 IMPLANT
NS IRRIG 1000ML POUR BTL (IV SOLUTION) ×3 IMPLANT
PACK PERI GYN (CUSTOM PROCEDURE TRAY) ×3 IMPLANT
PAD ARMBOARD 7.5X6 YLW CONV (MISCELLANEOUS) ×3 IMPLANT
PAD TELFA 3X4 1S STER (GAUZE/BANDAGES/DRESSINGS) ×3 IMPLANT
SET BASIN LINEN APH (SET/KITS/TRAYS/PACK) ×3 IMPLANT
SET IRRIG Y TYPE TUR BLADDER L (SET/KITS/TRAYS/PACK) ×3 IMPLANT
SUT CHROMIC 2 0 CT 1 (SUTURE) ×2 IMPLANT
SYR CONTROL 10ML LL (SYRINGE) ×3 IMPLANT

## 2014-05-22 NOTE — Anesthesia Procedure Notes (Signed)
Procedure Name: LMA Insertion Date/Time: 05/22/2014 10:53 AM Performed by: Franco NonesYATES, Webber Michiels S Pre-anesthesia Checklist: Patient identified, Patient being monitored, Emergency Drugs available, Timeout performed and Suction available Patient Re-evaluated:Patient Re-evaluated prior to inductionOxygen Delivery Method: Circle System Utilized Preoxygenation: Pre-oxygenation with 100% oxygen Intubation Type: IV induction Ventilation: Mask ventilation without difficulty LMA: LMA inserted LMA Size: 3.0 Number of attempts: 1 Placement Confirmation: positive ETCO2 and breath sounds checked- equal and bilateral Tube secured with: Tape

## 2014-05-22 NOTE — Addendum Note (Signed)
Addendum  created 05/22/14 1206 by Franco Noneseresa S Kearney Evitt, CRNA   Modules edited: Allergies, Notes Section   Notes Section:  File: [id number]; File: [id number]

## 2014-05-22 NOTE — Anesthesia Postprocedure Evaluation (Signed)
  Anesthesia Post-op Note  Patient: Kathleen Stewart  Procedure(s) Performed: Procedure(s): DILATATION & CURETTAGE/HYSTEROSCOPY WITH NOVASURE ABLATION (cavity length 5.5 cm, cavity width 4.2 cm; power 127 watts, time 1 minute) (N/A)  Patient Location: PACU  Anesthesia Type:General  Level of Consciousness: awake, alert  and patient cooperative  Airway and Oxygen Therapy: Patient Spontanous Breathing and Patient connected to nasal cannula oxygen  Post-op Pain: mild  Post-op Assessment: Post-op Vital signs reviewed, Patient's Cardiovascular Status Stable, Respiratory Function Stable and Patent Airway  Post-op Vital Signs: Reviewed and stable   Complications: No apparent anesthesia complications

## 2014-05-22 NOTE — Interval H&P Note (Signed)
History and Physical Interval Note:  05/22/2014 10:37 AM  Kathleen DickJaime A Mozingo  has presented today for surgery, with the diagnosis of menorrhagia  The various methods of treatment have been discussed with the patient and family. After consideration of risks, benefits and other options for treatment, the patient has consented to  Procedure(s): DILATATION & CURETTAGE/HYSTEROSCOPY WITH NOVASURE ABLATION (N/A) as a surgical intervention .  The patient's history has been reviewed, patient examined, no change in status, stable for surgery.  I have reviewed the patient's chart and labs.  Questions were answered to the patient's satisfaction.    LMP began 5 days ago.   Tilda BurrowFERGUSON,Tyshawn Keel V

## 2014-05-22 NOTE — Anesthesia Preprocedure Evaluation (Signed)
Anesthesia Evaluation  Patient identified by MRN, date of birth, ID band Patient awake    Reviewed: Allergy & Precautions, NPO status , Patient's Chart, lab work & pertinent test results  Airway Mallampati: I  TM Distance: >3 FB     Dental  (+) Teeth Intact, Partial Lower, Partial Upper   Pulmonary Current Smoker,  breath sounds clear to auscultation        Cardiovascular negative cardio ROS  Rhythm:Regular Rate:Normal     Neuro/Psych PSYCHIATRIC DISORDERS Anxiety Depression    GI/Hepatic GERD-  Medicated and Controlled,  Endo/Other    Renal/GU      Musculoskeletal Chronic LBP   Abdominal   Peds  Hematology   Anesthesia Other Findings   Reproductive/Obstetrics                             Anesthesia Physical Anesthesia Plan  ASA: II  Anesthesia Plan: General   Post-op Pain Management:    Induction: Intravenous  Airway Management Planned: LMA  Additional Equipment:   Intra-op Plan:   Post-operative Plan: Extubation in OR  Informed Consent: I have reviewed the patients History and Physical, chart, labs and discussed the procedure including the risks, benefits and alternatives for the proposed anesthesia with the patient or authorized representative who has indicated his/her understanding and acceptance.     Plan Discussed with:   Anesthesia Plan Comments:         Anesthesia Quick Evaluation

## 2014-05-22 NOTE — Anesthesia Postprocedure Evaluation (Addendum)
  Anesthesia Post-op Note  Patient: Kathleen Stewart  Procedure(s) Performed: Procedure(s): DILATATION & CURETTAGE/HYSTEROSCOPY WITH NOVASURE ABLATION (cavity length 5.5 cm, cavity width 4.2 cm; power 127 watts, time 1 minute) (N/A)  Patient Location: PACU  Anesthesia Type:General  Level of Consciousness: awake, alert  and patient cooperative  Airway and Oxygen Therapy: Patient Spontanous Breathing and Patient connected to nasal cannula oxygen  Post-op Pain: mild  Post-op Assessment: Post-op Vital signs reviewed, Patient's Cardiovascular Status Stable, Respiratory Function Stable and Patent Airway  Post-op Vital Signs: Reviewed and stable  Last Vitals:  Filed Vitals:   05/22/14 1138  BP: 120/52  Temp: 36.6 C  Resp: 18    Complications: Skin red on forehead, around eyes and mouth where tape/adhesives were on patient. Dr. Bari MantisGonzlaez aware and orders given to PACU. Vidal SchwalbeG. Witherspoon RN.  Patient informed of reaction, stated understanding.

## 2014-05-22 NOTE — Op Note (Signed)
see the brief op note for operative details

## 2014-05-22 NOTE — Transfer of Care (Signed)
Immediate Anesthesia Transfer of Care Note  Patient: Kathleen Stewart  Procedure(s) Performed: Procedure(s): DILATATION & CURETTAGE/HYSTEROSCOPY WITH NOVASURE ABLATION (cavity length 5.5 cm, cavity width 4.2 cm; power 127 watts, time 1 minute) (N/A)  Patient Location: PACU  Anesthesia Type:General  Level of Consciousness: awake, alert  and patient cooperative  Airway & Oxygen Therapy: Patient Spontanous Breathing and Patient connected to nasal cannula oxygen  Post-op Assessment: Report given to RN, Post -op Vital signs reviewed and stable and Patient moving all extremities  Post vital signs: Reviewed and stable  Last Vitals:  Filed Vitals:   05/22/14 1035  BP: 121/79  Temp:   Resp: 17    Complications: No apparent anesthesia complications

## 2014-05-22 NOTE — Discharge Instructions (Signed)
Dilation and Curettage or Vacuum Curettage, Care After Refer to this sheet in the next few weeks. These instructions provide you with information on caring for yourself after your procedure. Your health care provider may also give you more specific instructions. Your treatment has been planned according to current medical practices, but problems sometimes occur. Call your health care provider if you have any problems or questions after your procedure. WHAT TO EXPECT AFTER THE PROCEDURE After your procedure, it is typical to have light cramping and bleeding. This may last for 2 days to 2 weeks after the procedure. HOME CARE INSTRUCTIONS   Do not drive for 24 hours.  Wait 1 week before returning to strenuous activities.  Take your temperature 2 times a day for 4 days and write it down. Provide these temperatures to your health care provider if you develop a fever.  Avoid long periods of standing.  Avoid heavy lifting, pushing, or pulling. Do not lift anything heavier than 10 pounds (4.5 kg).  Limit stair climbing to once or twice a day.  Take rest periods often.  You may resume your usual diet.  Drink enough fluids to keep your urine clear or pale yellow.  Your usual bowel function should return. If you have constipation, you may:  Take a mild laxative with permission from your health care provider.  Add fruit and bran to your diet.  Drink more fluids.  Take showers instead of baths until your health care provider gives you permission to take baths.  Do not go swimming or use a hot tub until your health care provider approves.  Try to have someone with you or available to you the first 24-48 hours, especially if you were given a general anesthetic.  Do not douche, use tampons, or have sex (intercourse) for 2 weeks after the procedure.  Only take over-the-counter or prescription medicines as directed by your health care provider. Do not take aspirin. It can cause  bleeding.  Follow up with your health care provider as directed. SEEK MEDICAL CARE IF:   You have increasing cramps or pain that is not relieved with medicine.  You have abdominal pain that does not seem to be related to the same area of earlier cramping and pain.  You have bad smelling vaginal discharge.  You have a rash.  You are having problems with any medicine. SEEK IMMEDIATE MEDICAL CARE IF:   You have bleeding that is heavier than a normal menstrual period.  You have a fever.  You have chest pain.  You have shortness of breath.  You feel dizzy or feel like fainting.  You pass out.  You have pain in your shoulder strap area.  You have heavy vaginal bleeding with or without blood clots. MAKE SURE YOU:   Understand these instructions.  Will watch your condition.  Will get help right away if you are not doing well or get worse. Document Released: 02/14/2000 Document Revised: 02/21/2013 Document Reviewed: 09/15/2012 [facility] Patient Information 2015 [place], [place]. This information is not intended to replace advice given to you by your health care provider. Make sure you discuss any questions you have with your health care provider.   Endometrial Ablation Endometrial ablation removes the lining of the uterus (endometrium). It is usually a same-day, outpatient treatment. Ablation helps avoid major surgery, such as surgery to remove the cervix and uterus (hysterectomy). After endometrial ablation, you will have little or no menstrual bleeding and may not be able to have children. However, if  you are premenopausal, you will need to use a reliable method of birth control following the procedure because of the small chance that pregnancy can occur. There are different reasons to have this procedure, which include:  Heavy periods.  Bleeding that is causing anemia.  Irregular bleeding.  Bleeding fibroids on the lining inside the uterus if they are smaller than 3  centimeters. This procedure should not be done if:  You want children in the future.  You have severe cramps with your menstrual period.  You have precancerous or cancerous cells in your uterus.  You were recently pregnant.  You have gone through menopause.  You have had major surgery on the uterus, such as a cesarean delivery. LET [facility] LLCYOUR HEALTH CARE PROVIDER KNOW ABOUT:  Any allergies you have.  All medicines you are taking, including vitamins, herbs, eye drops, creams, and over-the-counter medicines.  Previous problems you or members of your family have had with the use of anesthetics.  Any blood disorders you have.  Previous surgeries you have had.  Medical conditions you have. RISKS AND COMPLICATIONS  Generally, this is a safe procedure. However, as with any procedure, complications can occur. Possible complications include:  Perforation of the uterus.  Bleeding.  Infection of the uterus, bladder, or vagina.  Injury to surrounding organs.  An air bubble to the lung (air embolus).  Pregnancy following the procedure.  Failure of the procedure to help the problem, requiring hysterectomy.  Decreased ability to diagnose cancer in the lining of the uterus. BEFORE THE PROCEDURE  The lining of the uterus must be tested to make sure there is no pre-cancerous or cancer cells present.  An ultrasound may be performed to look at the size of the uterus and to check for abnormalities.  Medicines may be given to thin the lining of the uterus. PROCEDURE  During the procedure, your health care provider will use a tool called a resectoscope to help see inside your uterus. There are different ways to remove the lining of your uterus.   Radiofrequency - This method uses a radiofrequency-alternating electric current to remove the lining of the uterus.  Cryotherapy - This method uses extreme cold to freeze the lining of the uterus.  Heated-Free Liquid - This method uses heated  salt (saline) solution to remove the lining of the uterus.  Microwave - This method uses high-energy microwaves to heat up the lining of the uterus to remove it.  Thermal balloon - This method involves inserting a catheter with a balloon tip into the uterus. The balloon tip is filled with heated fluid to remove the lining of the uterus. AFTER THE PROCEDURE  After your procedure, do not have sexual intercourse or insert anything into your vagina until permitted by your health care provider. After the procedure, you may experience:  Cramps.  Vaginal discharge.  Frequent urination. Document Released: 12/27/2003 Document Revised: 10/19/2012 Document Reviewed: 07/20/2012 [facility] Patient Information 2015 [place], [place]. This information is not intended to replace advice given to you by your health care provider. Make sure you discuss any questions you have with your health care provider.    PATIENT INSTRUCTIONS POST-ANESTHESIA  IMMEDIATELY FOLLOWING SURGERY:  Do not drive or operate machinery for the first twenty four hours after surgery.  Do not make any important decisions for twenty four hours after surgery or while taking narcotic pain medications or sedatives.  If you develop intractable nausea and vomiting or a severe headache please notify your doctor immediately.  FOLLOW-UP:  Please make an appointment with your surgeon as instructed. You do not need to follow up with anesthesia unless specifically instructed to do so.  WOUND CARE INSTRUCTIONS (if applicable):  Keep a dry clean dressing on the anesthesia/puncture wound site if there is drainage.  Once the wound has quit draining you may leave it open to air.  Generally you should leave the bandage intact for twenty four hours unless there is drainage.  If the epidural site drains for more than 36-48 hours please call the anesthesia department.  QUESTIONS?:  Please feel free to call your physician or the hospital operator if you have  any questions, and they will be happy to assist you.

## 2014-05-22 NOTE — Brief Op Note (Signed)
05/22/2014  12:47 PM  PATIENT:  Kathleen Stewart  39 y.o. female  PRE-OPERATIVE DIAGNOSIS:  menorrhagia  POST-OPERATIVE DIAGNOSIS:  menorrhagia  PROCEDURE:  Procedure(s): DILATATION & CURETTAGE/HYSTEROSCOPY WITH NOVASURE ABLATION (cavity length 5.5 cm, cavity width 4.2 cm; power 127 watts, time 1 minute) (N/A)  SURGEON:  Surgeon(s) and Role:    * Tilda BurrowJohn Demetruis Depaul V, MD - Primary  PHYSICIAN ASSISTANT:   ASSISTANTS: none   ANESTHESIA:   general and paracervical block  EBL:  Total I/O In: 1100 [I.V.:1100] Out: 0   BLOOD ADMINISTERED:none  DRAINS: none   LOCAL MEDICATIONS USED:  MARCAINE    and Amount: 30 ml  SPECIMEN:  No Specimen  DISPOSITION OF SPECIMEN:  N/A  COUNTS:  YES  TOURNIQUET:  * No tourniquets in log *  DICTATION: .Dragon Dictation  PLAN OF CARE: Discharge to home after PACU  PATIENT DISPOSITION:  PACU - hemodynamically stable.   Delay start of Pharmacological VTE agent (>24hrs) due to surgical blood loss or risk of bleeding: not applicable Details of procedure. Patient was taken operating room prepped and draped for vaginal procedure, with cervix grasped with single-tooth tenaculum and paracervical block applied. Uterus was sounded to 9.5 cm,, dilated to 24 [place], allowing the 23 French hysteroscope to be introduced without difficulty, and the endometrial cavity visualized as normal with both tubal ostia seen. Smooth sharp curettage was obtained, minimal tissue present. The NovaSure endometrial ablation device was then prepared according to protocol, but outside the body for to ensure the lateral spread of the arms, inserted with some difficulty due to the thick fibrous nature of the endocervical canal with cavity length measured at 5.5 cm. Once the NovaSure device was seated properly and vacuum tested confirming integrity of the endometrial cavity, the NovaSure device was activated and approximately 1 minute sequence performed. Her device was removed, and  hysteroscopy repeated to show a L obliterated endometrial cavity surface with no suspicion of abnormalities. The patient then was allowed to waken go recovery in good condition segment sponge and needle counts correct

## 2014-05-22 NOTE — Anesthesia Postprocedure Evaluation (Signed)
  Anesthesia Post-op Note  Patient: Kathleen Stewart  Procedure(s) Performed: Procedure(s): DILATATION & CURETTAGE/HYSTEROSCOPY WITH NOVASURE ABLATION (cavity length 5.5 cm, cavity width 4.2 cm; power 127 watts, time 1 minute) (N/A)  Patient Location: Short Stay  Anesthesia Type:General  Level of Consciousness: awake, alert  and patient cooperative  Airway and Oxygen Therapy: Patient Spontanous Breathing  Post-op Pain: mild  Post-op Assessment: Post-op Vital signs reviewed, Patient's Cardiovascular Status Stable, Respiratory Function Stable, Patent Airway and No signs of Nausea or vomiting  Post-op Vital Signs: Reviewed and stable  Last Vitals:  Filed Vitals:   05/22/14 1215  BP: 114/68  Pulse: 85  Temp:   Resp: 19    Complications: Red areas on face resolving. Patient denies complaints.

## 2014-05-22 NOTE — Addendum Note (Signed)
Addendum  created 05/22/14 1249 by Franco Noneseresa S Lajuana Patchell, CRNA   Modules edited: Notes Section   Notes Section:  File: [id number]

## 2014-05-23 ENCOUNTER — Telehealth: Payer: Self-pay | Admitting: Obstetrics and Gynecology

## 2014-05-23 ENCOUNTER — Encounter (HOSPITAL_COMMUNITY): Payer: Self-pay | Admitting: Obstetrics and Gynecology

## 2014-05-23 NOTE — Telephone Encounter (Signed)
Dr. Emelda FearFerguson gave permission to write note. Note written and put in front office for pick up.

## 2014-05-23 NOTE — Telephone Encounter (Signed)
Pt states that yesterday she had surgery and she needs a note for work. Starting on Tuesday - return to work on Monday. Pt had ablation, D&C, and biopsy. Ok to put on note that she had a surgical procedure. Pt's friend to pick up note.

## 2014-05-29 ENCOUNTER — Ambulatory Visit (INDEPENDENT_AMBULATORY_CARE_PROVIDER_SITE_OTHER): Payer: 59 | Admitting: Obstetrics and Gynecology

## 2014-05-29 VITALS — BP 120/80 | Ht 64.0 in | Wt 189.5 lb

## 2014-05-29 DIAGNOSIS — Z9889 Other specified postprocedural states: Secondary | ICD-10-CM | POA: Diagnosis not present

## 2014-05-29 NOTE — Progress Notes (Signed)
Patient ID: Kathleen Stewart, female   DOB: 03/25/1975, 39 y.o.   MRN: [id number] Pt here today for post op. Pt states that she was very bloated after surgery and now has some light bleeding. Pt denies any odor.  Subjective:     Kathleen Stewart is a 39 y.o. female who presents to the clinic 1 weeks status post operative hysteroscopy for bleeding. Diet:       regular without difficulty. Bowel function is: normal. Pain:     The patient is not having any pain.  The following portions of the patient's history were reviewed and updated as appropriate: allergies, current medications, past family history, past medical history, past social history, past surgical history and problem list.  Review of Systems Pertinent items are noted in HPI.    Objective:    BP 120/80 mmHg  Ht 5\' 4"  (1.626 m)  Wt 189 lb 8 oz (85.957 kg)  BMI 32.51 kg/m2  LMP 05/18/2014 General:  alert, cooperative and no distress  Abdomen: soft, bowel sounds active, non-tender  Incision:   healing well, vulva irritated by d/c       Pelvic: lite d/c, nonpurulent    Assessment:    Doing well postoperatively. Operative findings again reviewed. Pathology report discussed.    Plan:    1. Continue any current medications. 2. Wound care discussed. 3. Activity restrictions: no sex til 2 + wk and no d/c 4. Anticipated return to work: not applicable. 5. Follow up: prn  for  Complaints..Marland Kitchen

## 2014-08-13 ENCOUNTER — Other Ambulatory Visit (HOSPITAL_COMMUNITY): Payer: Self-pay | Admitting: Physician Assistant

## 2014-08-13 DIAGNOSIS — G894 Chronic pain syndrome: Secondary | ICD-10-CM

## 2014-08-13 DIAGNOSIS — R252 Cramp and spasm: Secondary | ICD-10-CM

## 2014-08-14 ENCOUNTER — Ambulatory Visit (HOSPITAL_COMMUNITY)
Admission: RE | Admit: 2014-08-14 | Discharge: 2014-08-14 | Disposition: A | Discharge status: Home / Self Care | Payer: 59 | Source: Ambulatory Visit | Attending: Physician Assistant | Admitting: Physician Assistant

## 2014-08-14 DIAGNOSIS — R609 Edema, unspecified: Secondary | ICD-10-CM | POA: Insufficient documentation

## 2014-08-14 DIAGNOSIS — M79605 Pain in left leg: Secondary | ICD-10-CM | POA: Diagnosis not present

## 2014-08-14 DIAGNOSIS — R252 Cramp and spasm: Secondary | ICD-10-CM

## 2014-08-14 DIAGNOSIS — G894 Chronic pain syndrome: Secondary | ICD-10-CM

## 2014-10-17 ENCOUNTER — Other Ambulatory Visit (HOSPITAL_COMMUNITY): Payer: Self-pay | Admitting: Physician Assistant

## 2014-10-17 DIAGNOSIS — Z1231 Encounter for screening mammogram for malignant neoplasm of breast: Secondary | ICD-10-CM

## 2014-10-25 ENCOUNTER — Ambulatory Visit (HOSPITAL_COMMUNITY)
Admission: RE | Admit: 2014-10-25 | Discharge: 2014-10-25 | Disposition: A | Discharge status: Home / Self Care | Payer: 59 | Source: Ambulatory Visit | Attending: Physician Assistant | Admitting: Physician Assistant

## 2014-10-25 DIAGNOSIS — Z1231 Encounter for screening mammogram for malignant neoplasm of breast: Secondary | ICD-10-CM | POA: Diagnosis present

## 2015-12-09 ENCOUNTER — Other Ambulatory Visit: Payer: Self-pay | Admitting: Adult Health

## 2015-12-09 DIAGNOSIS — Z1231 Encounter for screening mammogram for malignant neoplasm of breast: Secondary | ICD-10-CM

## 2015-12-13 ENCOUNTER — Ambulatory Visit (HOSPITAL_COMMUNITY)
Admission: RE | Admit: 2015-12-13 | Discharge: 2015-12-13 | Disposition: A | Discharge status: Home / Self Care | Payer: 59 | Source: Ambulatory Visit | Attending: Adult Health | Admitting: Adult Health

## 2015-12-13 DIAGNOSIS — Z1231 Encounter for screening mammogram for malignant neoplasm of breast: Secondary | ICD-10-CM | POA: Diagnosis not present

## 2015-12-25 ENCOUNTER — Emergency Department (HOSPITAL_COMMUNITY): Payer: 59

## 2015-12-25 ENCOUNTER — Encounter (HOSPITAL_COMMUNITY): Payer: Self-pay

## 2015-12-25 ENCOUNTER — Emergency Department (HOSPITAL_COMMUNITY)
Admission: EM | Admit: 2015-12-25 | Discharge: 2015-12-25 | Disposition: A | Discharge status: Home / Self Care | Payer: 59 | Attending: Emergency Medicine | Admitting: Emergency Medicine

## 2015-12-25 DIAGNOSIS — Z79899 Other long term (current) drug therapy: Secondary | ICD-10-CM | POA: Diagnosis not present

## 2015-12-25 DIAGNOSIS — Z791 Long term (current) use of non-steroidal anti-inflammatories (NSAID): Secondary | ICD-10-CM | POA: Insufficient documentation

## 2015-12-25 DIAGNOSIS — F17210 Nicotine dependence, cigarettes, uncomplicated: Secondary | ICD-10-CM | POA: Insufficient documentation

## 2015-12-25 DIAGNOSIS — R0602 Shortness of breath: Secondary | ICD-10-CM | POA: Insufficient documentation

## 2015-12-25 DIAGNOSIS — R0789 Other chest pain: Secondary | ICD-10-CM | POA: Insufficient documentation

## 2015-12-25 DIAGNOSIS — R079 Chest pain, unspecified: Secondary | ICD-10-CM

## 2015-12-25 LAB — CBC WITH DIFFERENTIAL/PLATELET
Basophils Absolute: 0 10*3/uL (ref 0.0–0.1)
Basophils Relative: 0 %
EOS ABS: 0.1 10*3/uL (ref 0.0–0.7)
Eosinophils Relative: 1 %
HEMATOCRIT: 42.3 % (ref 36.0–46.0)
HEMOGLOBIN: 14.1 g/dL (ref 12.0–15.0)
LYMPHS ABS: 2.7 10*3/uL (ref 0.7–4.0)
Lymphocytes Relative: 31 %
MCH: 31.6 pg (ref 26.0–34.0)
MCHC: 33.3 g/dL (ref 30.0–36.0)
MCV: 94.8 fL (ref 78.0–100.0)
Monocytes Absolute: 0.6 10*3/uL (ref 0.1–1.0)
Monocytes Relative: 7 %
NEUTROS PCT: 61 %
Neutro Abs: 5.3 10*3/uL (ref 1.7–7.7)
Platelets: 342 10*3/uL (ref 150–400)
RBC: 4.46 MIL/uL (ref 3.87–5.11)
RDW: 12.7 % (ref 11.5–15.5)
WBC: 8.7 10*3/uL (ref 4.0–10.5)

## 2015-12-25 LAB — BASIC METABOLIC PANEL
Anion gap: 5 (ref 5–15)
BUN: 8 mg/dL (ref 6–20)
CHLORIDE: 106 mmol/L (ref 101–111)
CO2: 26 mmol/L (ref 22–32)
CREATININE: 0.83 mg/dL (ref 0.44–1.00)
Calcium: 8.7 mg/dL — ABNORMAL LOW (ref 8.9–10.3)
GFR calc Af Amer: >60 mL/min (ref >60)
GFR calc non Af Amer: >60 mL/min (ref >60)
Glucose, Bld: 88 mg/dL (ref 65–99)
POTASSIUM: 3.7 mmol/L (ref 3.5–5.1)
SODIUM: 137 mmol/L (ref 135–145)

## 2015-12-25 LAB — I-STAT TROPONIN, ED: Troponin i, poc: 0 ng/mL (ref 0.00–0.08)

## 2015-12-25 MED ORDER — DEXTROSE 5 % IV SOLN: Freq: Once | INTRAVENOUS | Status: DC

## 2015-12-25 MED ORDER — IBUPROFEN 800 MG PO TABS: 800.0000 mg | ORAL_TABLET | Freq: Three times a day (TID) | ORAL | 0 refills | Status: DC

## 2015-12-25 MED ORDER — KETOROLAC TROMETHAMINE 30 MG/ML IJ SOLN
30.0000 mg | Freq: Once | INTRAMUSCULAR | Status: AC
  Administered 2015-12-25: 30 mg via INTRAVENOUS
  Filled 2015-12-25: qty 1

## 2015-12-25 MED ORDER — MORPHINE SULFATE (PF) 4 MG/ML IV SOLN
4.0000 mg | Freq: Once | INTRAVENOUS | Status: AC
  Administered 2015-12-25: 4 mg via INTRAVENOUS
  Filled 2015-12-25: qty 1

## 2015-12-25 NOTE — ED Provider Notes (Signed)
[facility]-EM[facility]ENCY DEPT Provider Note   CSN: [id number] Arrival date & time: 12/25/15  1018  By signing my name below, I, Majel HomerPeyton Lee, attest that this documentation has been prepared under the direction and in the presence of Lavera Guiseana Duo Anie Juniel, MD . Electronically Signed: Majel HomerPeyton Lee, Scribe. 12/25/2015. 10:58 AM.  History   Chief Complaint Chief Complaint  Patient presents with  . Chest Pain   The history is provided by the patient. No language interpreter was used.   HPI Comments: Kathleen Stewart is a 40 y.o. female who presents to the Emergency Department complaining of gradually worsening, "sharp," chest pain that began at ~4:30 AM this morning after waking up. Pt reports her pain begins in the center of her chest and radiates into the left side of her chest. She states hx of GERD and notes she initially believed her symptoms were due to this; however, she drank a coke and her pain did not resolve. She notes associated generalized weakness and shortness of breath and reports her pain is exacerbated with movement, taking a deep breath and sneezing. Husband states she was moving a heavy dresser with him prior to onset of symptoms. Pt denies increased physical activity, use of contraceptives, cough, congestion, fever, abdominal pain, hx of HTN, HLD or DM. Pt reports PFHx of MI in her grandparents in which her grandfather passed away at 552 years old. She states her sister is currently in the hospital for a PE in her thigh and groin but this is the first instance of this in her family.   Past Medical History:  Diagnosis Date  . Abdominal pain   . Abnormal Pap smear   . Anxiety   . Bloating   . Bulging lumbar disc   . Chronic back pain   . Chronic diarrhea   . Depression   . Gastroesophageal reflux   . History of abnormal cervical Pap smear 10/25/2013  . History of abnormal Pap smear 10/19/2012  . Insomnia   . Irritable bowel syndrome   . Menorrhagia 04/25/2014  . Night sweats 04/25/2014  .  Pelvic pain in female 04/25/2014  . Vaginal Pap smear, abnormal     Patient Active Problem List   Diagnosis Date Noted  . Menorrhagia 04/25/2014  . Night sweats 04/25/2014  . Pelvic pain in female 04/25/2014  . History of abnormal cervical Pap smear 10/25/2013  . History of abnormal Pap smear 10/19/2012  . ABDOMINAL BLOATING 04/12/2009  . IRRITABLE BOWEL SYNDROME 01/02/2009  . ANXIETY 11/30/2008  . GERD 11/30/2008  . BACK PAIN, CHRONIC 11/30/2008  . DIARRHEA, CHRONIC 11/30/2008  . ABDOMINAL PAIN 11/30/2008  . FRACTURE, PELVIS, RIGHT 11/30/2008    Past Surgical History:  Procedure Laterality Date  . COLONOSCOPY  12/24/2008  . DILITATION & CURRETTAGE/HYSTROSCOPY WITH NOVASURE ABLATION N/A 05/22/2014   Procedure: DILATATION & CURETTAGE/HYSTEROSCOPY WITH NOVASURE ABLATION (cavity length 5.5 cm, cavity width 4.2 cm; power 127 watts, time 1 minute);  Surgeon: Tilda BurrowJohn Ferguson V, MD;  Location: [facility];  Service: Gynecology;  Laterality: N/A;  . HYDROGEN BREATH TEST  01/29/2009  . UPPER GASTROINTESTINAL ENDOSCOPY  12/24/2008    OB History    Gravida Para Term Preterm AB Living   5 2     3 2    SAB TAB Ectopic Multiple Live Births   1 2     2      Home Medications    Prior to Admission medications   Medication Sig Start Date End Date Taking? Authorizing Provider  Alpha-D-Galactosidase (BEANO) TABS Take 2 tablets by mouth as needed (gas relief).    Yes Historical Provider, MD  ALPRAZolam Prudy Feeler) 1 MG tablet Take 1 mg by mouth at bedtime.   Yes Historical Provider, MD  escitalopram (LEXAPRO) 20 MG tablet Take 20 mg by mouth daily.   Yes Historical Provider, MD  HYDROcodone-acetaminophen (NORCO/VICODIN) 5-325 MG tablet Take 1 tablet by mouth daily as needed for moderate pain.   Yes Historical Provider, MD  ibuprofen (ADVIL,MOTRIN) 200 MG tablet Take 800 mg by mouth every 6 (six) hours as needed for headache or cramping.   Yes Historical Provider, MD  Multiple Vitamins-Minerals  (HAIR/SKIN/NAILS PO) Take 1 tablet by mouth daily.   Yes Historical Provider, MD  omeprazole (PRILOSEC) 40 MG capsule Take 40 mg by mouth 2 (two) times daily.   Yes Historical Provider, MD  ondansetron (ZOFRAN) 4 MG tablet Take 4 mg by mouth every 8 (eight) hours as needed for nausea or vomiting.   Yes Historical Provider, MD  Probiotic Product (TRUBIOTICS PO) Take 1 tablet by mouth daily.    Yes Historical Provider, MD  ibuprofen (ADVIL,MOTRIN) 800 MG tablet Take 1 tablet (800 mg total) by mouth 3 (three) times daily. 12/25/15   Lavera Guise, MD    Family History Family History  Problem Relation Age of Onset  . Emphysema Father   . Other Mother     fibroid tumors-had hyst    Social History Social History  Substance Use Topics  . Smoking status: Current Every Day Smoker    Packs/day: 1.00    Years: 20.00    Types: Cigarettes  . Smokeless tobacco: Never Used  . Alcohol use Yes     Comment: socially     Allergies   Adhesive [tape]   Review of Systems Review of Systems  Constitutional: Negative for fever.  HENT: Negative for congestion.   Respiratory: Positive for shortness of breath. Negative for cough.   Cardiovascular: Positive for chest pain.  Gastrointestinal: Negative for abdominal pain.  Neurological: Positive for weakness.  All other systems reviewed and are negative.  Physical Exam Updated Vital Signs BP 126/58   Pulse 68   Temp 97.9 F (36.6 C) (Oral)   Resp 16   Ht 5\' 4"  (1.626 m)   Wt 190 lb (86.2 kg)   SpO2 97%   BMI 32.61 kg/m   Physical Exam Physical Exam  Nursing note and vitals reviewed. Constitutional: Well developed, well nourished, non-toxic, and in no acute distress Head: Normocephalic and atraumatic.  Mouth/Throat: Oropharynx is clear and moist.  Neck: Normal range of motion. Neck supple.  Cardiovascular: Normal rate and regular rhythm.     Pulmonary/Chest: Effort normal and breath sounds normal. TTP along left chest wall.  Abdominal:  Soft. There is no tenderness. There is no rebound and no guarding.  Musculoskeletal: Normal range of motion. No leg swelling  Neurological: Alert, no facial droop, fluent speech, moves all extremities symmetrically Skin: Skin is warm and dry.  Psychiatric: Cooperative  ED Treatments / Results  Labs (all labs ordered are listed, but only abnormal results are displayed) Labs Reviewed  BASIC METABOLIC PANEL - Abnormal; Notable for the following:       Result Value   Calcium 8.7 (*)    All other components within normal limits  CBC WITH DIFFERENTIAL/PLATELET  Rosezena Sensor, ED  I-STAT BETA HCG BLOOD, ED ([facility], [facility], [facility] ONLY)    EKG  EKG Interpretation  Date/Time:  Wednesday December 25 2015  10:36:14 EDT Ventricular Rate:  68 PR Interval:    QRS Duration: 86 QT Interval:  405 QTC Calculation: 431 R Axis:   63 Text Interpretation:  Sinus rhythm no acute changes  Confirmed by Paulanthony Gleaves MD, Darrelyn Morro [phone]) on 12/25/2015 12:13:45 PM       Radiology Dg Chest 2 View  Result Date: 12/25/2015 CLINICAL DATA:  Chest pain. EXAM: CHEST  2 VIEW COMPARISON:  No recent prior . FINDINGS: Mediastinum hilar structures are normal. Mild cardiomegaly with mild bilateral interstitial prominence. A mild component of congestive heart failure cannot be excluded. Mild pneumonitis cannot be excluded. No pleural effusion or pneumothorax . No acute bony abnormality. IMPRESSION: 1. Mild cardiomegaly. 2. Mild bilateral pulmonary interstitial prominence. A mild component of interstitial edema and/or pneumonitis cannot be excluded . Electronically Signed   By: Maisie Fus  Register   On: 12/25/2015 11:33    Procedures Procedures (including critical care time)  Medications Ordered in ED Medications  morphine 4 MG/ML injection 4 mg (4 mg Intravenous Given 12/25/15 1113)  ketorolac (TORADOL) 30 MG/ML injection 30 mg (30 mg Intravenous Given 12/25/15 1254)    DIAGNOSTIC STUDIES:  Oxygen Saturation is 97% on RA, normal by  my interpretation.    COORDINATION OF CARE:  10:53 AM Discussed treatment plan with pt at bedside and pt agreed to plan.  Initial Impression / Assessment and Plan / ED Course  I have reviewed the triage vital signs and the nursing notes.  Pertinent labs & imaging results that were available during my care of the patient were reviewed by me and considered in my medical decision making (see chart for details).  Clinical Course   40 year old female who presents with left-sided chest pain. Vital signs within normal limits. Chest pain is reproducible with movement and palpation of the left anterior chest wall. Seems consistent with likely is musculoskeletal pain, especially if patient had recent heavy lifting prior to onset of symptoms. She is PERC negative and no concern for PE. Low risk for ACS and pain is atypical for that. Heart score 2 with initial EKG nonischemic and troponin normal. No suspicion for dissection, acute GI process, or any other acute intrathoracic or cardiopulmonary processes currently. Chest x-ray visualized and shows no acute cardiopulmonary processes. Discussed serial EKG and troponin, but patient refusing this and requesting discharge at this time. Seems unlikely ACS, but discussed percentage of cardiac events may be missed, which she expressed understanding of. Strict return and follow-up instructions reviewed. She expressed understanding of all discharge instructions and felt comfortable with the plan of care.     I personally performed the services described in this documentation, which was scribed in my presence. The recorded information has been reviewed and is accurate.   Final Clinical Impressions(s) / ED Diagnoses   Final diagnoses:  Nonspecific chest pain  Chest wall pain    New Prescriptions Discharge Medication List as of 12/25/2015 12:43 PM    START taking these medications   Details  !! ibuprofen (ADVIL,MOTRIN) 800 MG tablet Take 1 tablet (800 mg  total) by mouth 3 (three) times daily., Starting Wed 12/25/2015, Print     !! - Potential duplicate medications found. Please discuss with provider.       Lavera Guise, MD 12/25/15 [phone]

## 2015-12-25 NOTE — ED Triage Notes (Signed)
Pt c/o left sided chest pain since Friday but worse today>  Reports nausea.  Pt says pain is worse with movements and with deep breaths.  Denies SOB.

## 2015-12-25 NOTE — Discharge Instructions (Signed)
Please take ibuprofen for pain, use ice packs or heat packs as needed.  Return without fail for worsening symptoms, including worsening pain, intractable vomiting, difficulty breathing, passing out or any other symptoms concerning to you.

## 2015-12-25 NOTE — ED Notes (Signed)
Patient transported to X-ray 

## 2016-05-25 ENCOUNTER — Emergency Department (HOSPITAL_COMMUNITY): Payer: 59

## 2016-05-25 ENCOUNTER — Inpatient Hospital Stay (HOSPITAL_COMMUNITY)
Admission: EM | Admit: 2016-05-25 | Discharge: 2016-05-27 | DRG: 446 | Disposition: A | Discharge status: Home / Self Care | Payer: 59 | Attending: Internal Medicine | Admitting: Internal Medicine

## 2016-05-25 ENCOUNTER — Encounter (HOSPITAL_COMMUNITY): Payer: Self-pay | Admitting: *Deleted

## 2016-05-25 DIAGNOSIS — K8070 Calculus of gallbladder and bile duct without cholecystitis without obstruction: Secondary | ICD-10-CM | POA: Diagnosis not present

## 2016-05-25 DIAGNOSIS — G8929 Other chronic pain: Secondary | ICD-10-CM | POA: Diagnosis present

## 2016-05-25 DIAGNOSIS — F17210 Nicotine dependence, cigarettes, uncomplicated: Secondary | ICD-10-CM | POA: Diagnosis present

## 2016-05-25 DIAGNOSIS — Z79899 Other long term (current) drug therapy: Secondary | ICD-10-CM

## 2016-05-25 DIAGNOSIS — R1011 Right upper quadrant pain: Secondary | ICD-10-CM | POA: Diagnosis not present

## 2016-05-25 DIAGNOSIS — R740 Nonspecific elevation of levels of transaminase and lactic acid dehydrogenase [LDH]: Secondary | ICD-10-CM | POA: Diagnosis present

## 2016-05-25 DIAGNOSIS — R945 Abnormal results of liver function studies: Secondary | ICD-10-CM

## 2016-05-25 DIAGNOSIS — K589 Irritable bowel syndrome without diarrhea: Secondary | ICD-10-CM | POA: Diagnosis present

## 2016-05-25 DIAGNOSIS — Z791 Long term (current) use of non-steroidal anti-inflammatories (NSAID): Secondary | ICD-10-CM

## 2016-05-25 DIAGNOSIS — K580 Irritable bowel syndrome with diarrhea: Secondary | ICD-10-CM | POA: Diagnosis present

## 2016-05-25 DIAGNOSIS — F419 Anxiety disorder, unspecified: Secondary | ICD-10-CM | POA: Diagnosis present

## 2016-05-25 DIAGNOSIS — K8050 Calculus of bile duct without cholangitis or cholecystitis without obstruction: Secondary | ICD-10-CM | POA: Diagnosis not present

## 2016-05-25 DIAGNOSIS — G4700 Insomnia, unspecified: Secondary | ICD-10-CM | POA: Diagnosis present

## 2016-05-25 DIAGNOSIS — F329 Major depressive disorder, single episode, unspecified: Secondary | ICD-10-CM | POA: Diagnosis present

## 2016-05-25 DIAGNOSIS — K219 Gastro-esophageal reflux disease without esophagitis: Secondary | ICD-10-CM | POA: Diagnosis present

## 2016-05-25 DIAGNOSIS — Z9109 Other allergy status, other than to drugs and biological substances: Secondary | ICD-10-CM

## 2016-05-25 DIAGNOSIS — R7989 Other specified abnormal findings of blood chemistry: Secondary | ICD-10-CM | POA: Diagnosis not present

## 2016-05-25 LAB — SURGICAL PCR SCREEN
MRSA, PCR: NEGATIVE
Staphylococcus aureus: NEGATIVE

## 2016-05-25 LAB — COMPREHENSIVE METABOLIC PANEL
ALBUMIN: 4.1 g/dL (ref 3.5–5.0)
ALK PHOS: 80 U/L (ref 38–126)
ALT: 206 U/L — ABNORMAL HIGH (ref 14–54)
AST: 294 U/L — ABNORMAL HIGH (ref 15–41)
Anion gap: 8 (ref 5–15)
BUN: 11 mg/dL (ref 6–20)
CO2: 25 mmol/L (ref 22–32)
Calcium: 9 mg/dL (ref 8.9–10.3)
Chloride: 99 mmol/L — ABNORMAL LOW (ref 101–111)
Creatinine, Ser: 0.82 mg/dL (ref 0.44–1.00)
GFR calc Af Amer: >60 mL/min (ref >60)
GFR calc non Af Amer: >60 mL/min (ref >60)
GLUCOSE: 157 mg/dL — AB (ref 65–99)
Potassium: 3.7 mmol/L (ref 3.5–5.1)
Sodium: 132 mmol/L — ABNORMAL LOW (ref 135–145)
Total Bilirubin: 1.3 mg/dL — ABNORMAL HIGH (ref 0.3–1.2)
Total Protein: 7.1 g/dL (ref 6.5–8.1)

## 2016-05-25 LAB — CBC
HEMATOCRIT: 40.4 % (ref 36.0–46.0)
HEMOGLOBIN: 13.9 g/dL (ref 12.0–15.0)
MCH: 32.1 pg (ref 26.0–34.0)
MCHC: 34.4 g/dL (ref 30.0–36.0)
MCV: 93.3 fL (ref 78.0–100.0)
Platelets: 327 10*3/uL (ref 150–400)
RBC: 4.33 MIL/uL (ref 3.87–5.11)
RDW: 12.4 % (ref 11.5–15.5)
WBC: 10.8 10*3/uL — ABNORMAL HIGH (ref 4.0–10.5)

## 2016-05-25 LAB — LIPASE, BLOOD: Lipase: 25 U/L (ref 11–51)

## 2016-05-25 LAB — I-STAT BETA HCG BLOOD, ED (MC, WL, AP ONLY): I-stat hCG, quantitative: <5 m[IU]/mL (ref <5)

## 2016-05-25 MED ORDER — SODIUM CHLORIDE 0.9 % IV SOLN
1000.0000 mL | INTRAVENOUS | Status: DC
  Administered 2016-05-25 (×3): 1000 mL via INTRAVENOUS

## 2016-05-25 MED ORDER — ACETAMINOPHEN 325 MG PO TABS
650.0000 mg | ORAL_TABLET | Freq: Four times a day (QID) | ORAL | Status: DC | PRN
  Administered 2016-05-25: 650 mg via ORAL
  Filled 2016-05-25: qty 2

## 2016-05-25 MED ORDER — ONDANSETRON HCL 4 MG/2ML IJ SOLN
4.0000 mg | Freq: Four times a day (QID) | INTRAMUSCULAR | Status: DC | PRN
  Administered 2016-05-25: 4 mg via INTRAVENOUS
  Filled 2016-05-25 (×2): qty 2

## 2016-05-25 MED ORDER — ALPRAZOLAM 1 MG PO TABS
1.0000 mg | ORAL_TABLET | Freq: Every evening | ORAL | Status: DC | PRN
  Administered 2016-05-25 – 2016-05-26 (×2): 1 mg via ORAL
  Filled 2016-05-25 (×2): qty 1

## 2016-05-25 MED ORDER — IOPAMIDOL (ISOVUE-300) INJECTION 61%
100.0000 mL | Freq: Once | INTRAVENOUS | Status: AC | PRN
  Administered 2016-05-25: 100 mL via INTRAVENOUS

## 2016-05-25 MED ORDER — HYDROMORPHONE HCL 1 MG/ML IJ SOLN
1.0000 mg | INTRAMUSCULAR | Status: DC | PRN
  Administered 2016-05-25 – 2016-05-27 (×10): 1 mg via INTRAVENOUS
  Filled 2016-05-25 (×10): qty 1

## 2016-05-25 MED ORDER — PROMETHAZINE HCL 25 MG/ML IJ SOLN
12.5000 mg | Freq: Four times a day (QID) | INTRAMUSCULAR | Status: DC | PRN
  Administered 2016-05-25: 12.5 mg via INTRAVENOUS
  Filled 2016-05-25 (×2): qty 1

## 2016-05-25 MED ORDER — SODIUM CHLORIDE 0.9 % IV SOLN
1000.0000 mL | Freq: Once | INTRAVENOUS | Status: AC
  Administered 2016-05-25: 1000 mL via INTRAVENOUS

## 2016-05-25 MED ORDER — ONDANSETRON HCL 4 MG PO TABS: 4.0000 mg | ORAL_TABLET | Freq: Four times a day (QID) | ORAL | Status: DC | PRN

## 2016-05-25 MED ORDER — NICOTINE 14 MG/24HR TD PT24
14.0000 mg | MEDICATED_PATCH | Freq: Once | TRANSDERMAL | Status: AC
  Administered 2016-05-25: 14 mg via TRANSDERMAL
  Filled 2016-05-25: qty 1

## 2016-05-25 MED ORDER — HYDROMORPHONE HCL 1 MG/ML IJ SOLN
1.0000 mg | Freq: Once | INTRAMUSCULAR | Status: AC
  Administered 2016-05-25: 1 mg via INTRAVENOUS
  Filled 2016-05-25: qty 1

## 2016-05-25 MED ORDER — ONDANSETRON HCL 4 MG/2ML IJ SOLN
4.0000 mg | Freq: Once | INTRAMUSCULAR | Status: AC
  Administered 2016-05-25: 4 mg via INTRAVENOUS
  Filled 2016-05-25: qty 2

## 2016-05-25 MED ORDER — SODIUM CHLORIDE 0.9 % IV SOLN
1000.0000 mL | INTRAVENOUS | Status: DC
  Administered 2016-05-25 – 2016-05-26 (×2): 1000 mL via INTRAVENOUS

## 2016-05-25 MED ORDER — CHLORHEXIDINE GLUCONATE CLOTH 2 % EX PADS
6.0000 | MEDICATED_PAD | Freq: Every day | CUTANEOUS | Status: DC
  Administered 2016-05-26: 6 via TOPICAL

## 2016-05-25 MED ORDER — IOPAMIDOL (ISOVUE-300) INJECTION 61%
30.0000 mL | Freq: Once | INTRAVENOUS | Status: AC | PRN
  Administered 2016-05-25: 30 mL via ORAL

## 2016-05-25 NOTE — ED Triage Notes (Signed)
Pt c/o upper abdominal pain with n/v/d that started yesterday; pt c/o having hot flashes

## 2016-05-25 NOTE — Consult Note (Signed)
Referring Provider: Dr. Onalee Huaavid Primary Care Physician:  Dwana MelenaZack Hall, MD Primary Gastroenterologist:  Dr. Darrick PennaFields   Date of Admission: 05/25/16 Date of Consultation: 05/25/16  Reason for Consultation: Choledocholithiasis   HPI:  Kathleen Stewart is a 41 y.o. year old female presenting to the ED early this morning with worsening RUQ pain, associated N/V, found to have elevated transaminases and CT with cholelithiasis and choledocholithiasis with a 2 mm stone in distal common bile duct with resultant mild biliary dilatation and gallbladder inflammation. AST 294, ALT 206. Tbili mildly elevated at 1.3.   Yesterday morning awoke with RUQ pain that persisted as the day wore on. Initially thought it was indigestion. Started to radiate from middle to RUQ and back again. Started spasming. As day progressed, had associated N/V. Has chills but no fever. Pain has improved but just feels tired. No prior issues with RUQ pain. No anticoagulation as outpatient. She is eager to go home as soon as possible. Negative pregnancy screen on file.   History of IBS with chronic diarrhea at baseline.   Past Medical History:  Diagnosis Date  . Abdominal pain   . Abnormal Pap smear   . Anxiety   . Bloating   . Bulging lumbar disc   . Chronic back pain   . Chronic diarrhea   . Depression   . Gastroesophageal reflux   . History of abnormal cervical Pap smear 10/25/2013  . History of abnormal Pap smear 10/19/2012  . Insomnia   . Irritable bowel syndrome   . Menorrhagia 04/25/2014  . Night sweats 04/25/2014  . Pelvic pain in female 04/25/2014  . Vaginal Pap smear, abnormal     Past Surgical History:  Procedure Laterality Date  . COLONOSCOPY  12/24/2008   Dr. Darrick PennaFields: normal, normal colonic biopsies   . DILITATION & CURRETTAGE/HYSTROSCOPY WITH NOVASURE ABLATION N/A 05/22/2014   Procedure: DILATATION & CURETTAGE/HYSTEROSCOPY WITH NOVASURE ABLATION (cavity length 5.5 cm, cavity width 4.2 cm; power 127 watts, time 1  minute);  Surgeon: Tilda BurrowJohn Ferguson V, MD;  Location: [facility];  Service: Gynecology;  Laterality: N/A;  . HYDROGEN BREATH TEST  01/29/2009  . UPPER GASTROINTESTINAL ENDOSCOPY  12/24/2008   Dr. Darrick PennaFields: normal esophagus, mild gastritis    Prior to Admission medications   Medication Sig Start Date End Date Taking? Authorizing Provider  Alpha-D-Galactosidase Charlyne Quale(BEANO) TABS Take 2 tablets by mouth as needed (gas relief).     Historical Provider, MD  ALPRAZolam Prudy Feeler(XANAX) 1 MG tablet Take 1 mg by mouth at bedtime.    Historical Provider, MD  escitalopram (LEXAPRO) 20 MG tablet Take 20 mg by mouth daily.    Historical Provider, MD  HYDROcodone-acetaminophen (NORCO/VICODIN) 5-325 MG tablet Take 1 tablet by mouth daily as needed for moderate pain.    Historical Provider, MD  ibuprofen (ADVIL,MOTRIN) 200 MG tablet Take 800 mg by mouth every 6 (six) hours as needed for headache or cramping.    Historical Provider, MD  ibuprofen (ADVIL,MOTRIN) 800 MG tablet Take 1 tablet (800 mg total) by mouth 3 (three) times daily. 12/25/15   Lavera Guiseana Duo Liu, MD  Multiple Vitamins-Minerals (HAIR/SKIN/NAILS PO) Take 1 tablet by mouth daily.    Historical Provider, MD  omeprazole (PRILOSEC) 40 MG capsule Take 40 mg by mouth 2 (two) times daily.    Historical Provider, MD  ondansetron (ZOFRAN) 4 MG tablet Take 4 mg by mouth every 8 (eight) hours as needed for nausea or vomiting.    Historical Provider, MD  Probiotic Product (  TRUBIOTICS PO) Take 1 tablet by mouth daily.     Historical Provider, MD    Current Facility-Administered Medications  Medication Dose Route Frequency Provider Last Rate Last Dose  . 0.9 %  sodium chloride infusion  1,000 mL Intravenous Continuous Azalia Bilis, MD 125 mL/hr at 05/25/16 0703 1,000 mL at 05/25/16 0703  . HYDROmorphone (DILAUDID) injection 1 mg  1 mg Intravenous Q4H PRN Haydee Monica, MD   1 mg at 05/25/16 0702  . nicotine (NICODERM CQ - dosed in mg/24 hours) patch 14 mg  14 mg Transdermal Once  Azalia Bilis, MD   14 mg at 05/25/16 1610  . ondansetron (ZOFRAN) tablet 4 mg  4 mg Oral Q6H PRN Haydee Monica, MD       Or  . ondansetron [facility]) injection 4 mg  4 mg Intravenous Q6H PRN Haydee Monica, MD   4 mg at 05/25/16 9604    Allergies as of 05/25/2016 - Review Complete 05/25/2016  Allergen Reaction Noted  . Adhesive [tape] Dermatitis 05/22/2014    Family History  Problem Relation Age of Onset  . Emphysema Father   . Other Mother     fibroid tumors-had hyst  . Colon cancer Neg Hx     Social History   Social History  . Marital status: Married    Spouse name: N/A  . Number of children: N/A  . Years of education: N/A   Occupational History  . Not on file.   Social History Main Topics  . Smoking status: Current Every Day Smoker    Packs/day: 1.00    Years: 20.00    Types: Cigarettes  . Smokeless tobacco: Never Used  . Alcohol use Yes     Comment: socially  . Drug use: No  . Sexual activity: Yes    Birth control/ protection: Other-see comments     Comment: vasectomy   Other Topics Concern  . Not on file   Social History Narrative  . No narrative on file    Review of Systems: Gen: see HPI  CV: Denies chest pain, heart palpitations, syncope, edema  Resp: Denies shortness of breath with rest, cough, wheezing GI: see HPI  GU : Denies urinary burning, urinary frequency, urinary incontinence.  MS: Denies joint pain,swelling, cramping Derm: Denies rash, itching, dry skin Psych: Denies depression, anxiety,confusion, or memory loss Heme: see HPI   Physical Exam: Vital signs in last 24 hours: Temp:  [97.6 F (36.4 C)] 97.6 F (36.4 C) (03/26 0304) Pulse Rate:  [71-76] 76 (03/26 0532) Resp:  [18-20] 18 (03/26 0532) BP: (108-115)/(58-63) 108/63 (03/26 0532) SpO2:  [96 %-97 %] 97 % (03/26 0532) Weight:  [190 lb (86.2 kg)] 190 lb (86.2 kg) (03/26 0302)   General:   Alert,  Well-developed, well-nourished, pleasant and cooperative in NAD Head:   Normocephalic and atraumatic. Eyes:  Sclera clear, no icterus.   Conjunctiva pink. Ears:  Normal auditory acuity. Nose:  No deformity, discharge,  or lesions. Mouth:  No deformity or lesions, dentition normal. Lungs:  Clear throughout to auscultation.   No wheezes, crackles, or rhonchi. No acute distress. Heart:  Regular rate and rhythm; no murmurs, clicks, rubs,  or gallops. Abdomen:  Soft, TTP epigastric and RUQ and nondistended. No masses, hepatosplenomegaly or hernias noted. Normal bowel sounds, without guarding, and without rebound.   Rectal:  Deferred  Msk:  Symmetrical without gross deformities. Normal posture. Extremities:  Without  edema. Neurologic:  Alert and  oriented x4 Psych:  Alert and cooperative. Normal mood and affect.  Intake/Output from previous day: 03/25 0701 - 03/26 0700 In: 1000 [I.V.:1000] Out: -  Intake/Output this shift: No intake/output data recorded.  Lab Results:  Recent Labs  05/25/16 0357  WBC 10.8*  HGB 13.9  HCT 40.4  PLT 327   BMET  Recent Labs  05/25/16 0357  NA 132*  K 3.7  CL 99*  CO2 25  GLUCOSE 157*  BUN 11  CREATININE 0.82  CALCIUM 9.0   LFT  Recent Labs  05/25/16 0357  PROT 7.1  ALBUMIN 4.1  AST 294*  ALT 206*  ALKPHOS 80  BILITOT 1.3*   Lab Results  Component Value Date   LIPASE 25 05/25/2016    Studies/Results: Ct Abdomen Pelvis W Contrast  Result Date: 05/25/2016 CLINICAL DATA:  Upper abdominal pain.  Right upper quadrant pain. EXAM: CT ABDOMEN AND PELVIS WITH CONTRAST TECHNIQUE: Multidetector CT imaging of the abdomen and pelvis was performed using the standard protocol following bolus administration of intravenous contrast. CONTRAST:  100 cc Isovue-300 IV COMPARISON:  None. FINDINGS: Lower chest: Dependent atelectasis at the lung bases, possible scarring in the left lower lobe. Hepatobiliary: No focal hepatic lesion. Multiple calcified gallstones within the gallbladder. Mild gallbladder wall thickening.  Calcified 2 mm stone in the distal most common bile duct. Mild intra and extrahepatic biliary dilatation, the common bile duct measures at the porta hepatis. Borderline hepatic steatosis. Pancreas: No ductal dilatation or inflammation. Spleen: Normal in size without focal abnormality. Adrenals/Urinary Tract: Normal adrenal glands. No hydronephrosis. Symmetric renal enhancement and excretion on delayed phase imaging. Urinary bladder is near completely decompressed. Stomach/Bowel: Stomach is physiologically distended with ingested contrast. The appendix is normal. Colon is decompressed limiting assessment, no evidence of colonic inflammation. No small bowel wall thickening, distention or inflammation. Vascular/Lymphatic: Retroaortic left renal vein, incidentally noted. Trace abdominal aortic atherosclerosis. No adenopathy. Reproductive: 2 cm follicular cyst in the right ovary is likely incidental. Uterus and left ovary are unremarkable. Small amount of simple free fluid in the pelvic cul-de-sac. Other: No free air. No abdominal ascites. Small fat containing umbilical hernia. Musculoskeletal: Bilateral L5 pars interarticularis defects with trace anterolisthesis of L5 on S1. There are no acute or suspicious osseous abnormalities. IMPRESSION: 1. Cholelithiasis with a choledocholithiasis, a 2 mm stone is in the distal common bile duct. Resultant mild biliary dilatation and gallbladder inflammation. 2. Minimal aortic atherosclerosis. Electronically Signed   By: Rubye Oaks M.D.   On: 05/25/2016 05:05    Impression: 41 year old female admitted with choledocholithiasis, does not clinically appear to have signs of cholangitis. N/V resolved since admission and pain controlled with supportive measures. Will plan on ERCP on 3/27 with Dr. Darrick Penna. Will eventually need elective cholecystectomy. Continue to monitor clinically and provide supportive measures in interim. Discussed risks and benefits with patient in detail.    Plan: Repeat CBC, BMP, HFP in am Continue supportive measures; monitor for any signs of cholangitis Add Phenergan 12.5 mg IV as needed for nausea Will plan on ERCP with Dr. Darrick Penna on 3/27 NPO after midnight  Gelene Mink, ANP-BC [facility]      LOS: 0 days    05/25/2016, 10:27 AM

## 2016-05-25 NOTE — ED Notes (Signed)
Pt informed of need for urine sample but pt states that she is unable to provide one at this time. Pt was instructed to let nursing staff know when she is able to provide one.

## 2016-05-25 NOTE — Progress Notes (Signed)
Patient briefly seen and examined. Database reviewed. Here with abdominal pain, n/v, found to have choledocholithiasis. Seen by GI with plans for ERCP in am. Recheck LFTs in am. Will continue to follow.  Peggye PittEstela Hernandez, MD [facility] Pager: [phone]

## 2016-05-25 NOTE — H&P (Signed)
History and Physical    Kathleen Stewart ZOX:[id number] DOB: 1976/02/03 DOA: 05/25/2016  PCP: Dwana Melena, MD  Patient coming from:  home  Chief Complaint:   Abdominal pain  HPI: Kathleen Stewart is a 41 y.o. female with medical history significant of GERD comes in with about one day of progressive worsening right upper quadrant abdominal pain and epigastric pain associated with vomiting which is nonbloody.  She has IBS so has also been having loose stools.  Denies any fevers.  Pain is not affected by eating but has progressively worsened and become more persistent since yesterday morning.  She still has her gallbladder.  Has not had issues with her gallbladder before.  Pt found to have CBD stone on ct today and referred for admission for likely need for ERCP and eventual cholesytectomy.  Pts pain is well controlled with meds she has received in ED already.  Review of Systems: As per HPI otherwise 10 point review of systems negative.   Past Medical History:  Diagnosis Date  . Abdominal pain   . Abnormal Pap smear   . Anxiety   . Bloating   . Bulging lumbar disc   . Chronic back pain   . Chronic diarrhea   . Depression   . Gastroesophageal reflux   . History of abnormal cervical Pap smear 10/25/2013  . History of abnormal Pap smear 10/19/2012  . Insomnia   . Irritable bowel syndrome   . Menorrhagia 04/25/2014  . Night sweats 04/25/2014  . Pelvic pain in female 04/25/2014  . Vaginal Pap smear, abnormal     Past Surgical History:  Procedure Laterality Date  . COLONOSCOPY  12/24/2008  . DILITATION & CURRETTAGE/HYSTROSCOPY WITH NOVASURE ABLATION N/A 05/22/2014   Procedure: DILATATION & CURETTAGE/HYSTEROSCOPY WITH NOVASURE ABLATION (cavity length 5.5 cm, cavity width 4.2 cm; power 127 watts, time 1 minute);  Surgeon: Tilda Burrow, MD;  Location: [facility];  Service: Gynecology;  Laterality: N/A;  . HYDROGEN BREATH TEST  01/29/2009  . UPPER GASTROINTESTINAL ENDOSCOPY  12/24/2008     reports that she has been smoking Cigarettes.  She has a 20.00 pack-year smoking history. She has never used smokeless tobacco. She reports that she drinks alcohol. She reports that she does not use drugs.  Allergies  Allergen Reactions  . Adhesive [Tape] Dermatitis    Redness at adhesive sites    Family History  Problem Relation Age of Onset  . Emphysema Father   . Other Mother     fibroid tumors-had hyst    Prior to Admission medications   Medication Sig Start Date End Date Taking? Authorizing Provider  Alpha-D-Galactosidase Charlyne Quale) TABS Take 2 tablets by mouth as needed (gas relief).     Historical Provider, MD  ALPRAZolam Prudy Feeler) 1 MG tablet Take 1 mg by mouth at bedtime.    Historical Provider, MD  escitalopram (LEXAPRO) 20 MG tablet Take 20 mg by mouth daily.    Historical Provider, MD  HYDROcodone-acetaminophen (NORCO/VICODIN) 5-325 MG tablet Take 1 tablet by mouth daily as needed for moderate pain.    Historical Provider, MD  ibuprofen (ADVIL,MOTRIN) 200 MG tablet Take 800 mg by mouth every 6 (six) hours as needed for headache or cramping.    Historical Provider, MD  ibuprofen (ADVIL,MOTRIN) 800 MG tablet Take 1 tablet (800 mg total) by mouth 3 (three) times daily. 12/25/15   Lavera Guise, MD  Multiple Vitamins-Minerals (HAIR/SKIN/NAILS PO) Take 1 tablet by mouth daily.    Historical Provider, MD  omeprazole (PRILOSEC) 40 MG capsule Take 40 mg by mouth 2 (two) times daily.    Historical Provider, MD  ondansetron (ZOFRAN) 4 MG tablet Take 4 mg by mouth every 8 (eight) hours as needed for nausea or vomiting.    Historical Provider, MD  Probiotic Product (TRUBIOTICS PO) Take 1 tablet by mouth daily.     Historical Provider, MD    Physical Exam: Vitals:   05/25/16 0302 05/25/16 0304 05/25/16 0532  BP:  (!) 115/58 108/63  Pulse:  71 76  Resp:  20 18  Temp:  97.6 F (36.4 C)   TempSrc:  Oral   SpO2:  96% 97%  Weight: 86.2 kg (190 lb)    Height: 5\' 4"  (1.626 m)       Constitutional: NAD, calm, comfortable Vitals:   05/25/16 0302 05/25/16 0304 05/25/16 0532  BP:  (!) 115/58 108/63  Pulse:  71 76  Resp:  20 18  Temp:  97.6 F (36.4 C)   TempSrc:  Oral   SpO2:  96% 97%  Weight: 86.2 kg (190 lb)    Height: 5\' 4"  (1.626 m)     Eyes: PERRL, lids and conjunctivae normal ENMT: Mucous membranes are moist. Posterior pharynx clear of any exudate or lesions.Normal dentition.  Neck: normal, supple, no masses, no thyromegaly Respiratory: clear to auscultation bilaterally, no wheezing, no crackles. Normal respiratory effort. No accessory muscle use.  Cardiovascular: Regular rate and rhythm, no murmurs / rubs / gallops. No extremity edema. 2+ pedal pulses. No carotid bruits.  Abdomen: RUQ tenderness, no masses palpated. No hepatosplenomegaly. Bowel sounds positive.  No R/G.  nonacute exam, nondistended Musculoskeletal: no clubbing / cyanosis. No joint deformity upper and lower extremities. Good ROM, no contractures. Normal muscle tone.  Skin: no rashes, lesions, ulcers. No induration Neurologic: CN 2-12 grossly intact. Sensation intact, DTR normal. Strength 5/5 in all 4.  Psychiatric: Normal judgment and insight. Alert and oriented x 3. Normal mood.    Labs on Admission: I have personally reviewed following labs and imaging studies  CBC:  Recent Labs Lab 05/25/16 0357  WBC 10.8*  HGB 13.9  HCT 40.4  MCV 93.3  PLT 327   Basic Metabolic Panel:  Recent Labs Lab 05/25/16 0357  NA 132*  K 3.7  CL 99*  CO2 25  GLUCOSE 157*  BUN 11  CREATININE 0.82  CALCIUM 9.0   GFR: Estimated Creatinine Clearance: 95.9 mL/min (by C-G formula based on SCr of 0.82 mg/dL). Liver Function Tests:  Recent Labs Lab 05/25/16 0357  AST 294*  ALT 206*  ALKPHOS 80  BILITOT 1.3*  PROT 7.1  ALBUMIN 4.1    Recent Labs Lab 05/25/16 0357  LIPASE 25    Radiological Exams on Admission: Ct Abdomen Pelvis W Contrast  Result Date: 05/25/2016 CLINICAL DATA:   Upper abdominal pain.  Right upper quadrant pain. EXAM: CT ABDOMEN AND PELVIS WITH CONTRAST TECHNIQUE: Multidetector CT imaging of the abdomen and pelvis was performed using the standard protocol following bolus administration of intravenous contrast. CONTRAST:  100 cc Isovue-300 IV COMPARISON:  None. FINDINGS: Lower chest: Dependent atelectasis at the lung bases, possible scarring in the left lower lobe. Hepatobiliary: No focal hepatic lesion. Multiple calcified gallstones within the gallbladder. Mild gallbladder wall thickening. Calcified 2 mm stone in the distal most common bile duct. Mild intra and extrahepatic biliary dilatation, the common bile duct measures at the porta hepatis. Borderline hepatic steatosis. Pancreas: No ductal dilatation or inflammation. Spleen: Normal in size without  focal abnormality. Adrenals/Urinary Tract: Normal adrenal glands. No hydronephrosis. Symmetric renal enhancement and excretion on delayed phase imaging. Urinary bladder is near completely decompressed. Stomach/Bowel: Stomach is physiologically distended with ingested contrast. The appendix is normal. Colon is decompressed limiting assessment, no evidence of colonic inflammation. No small bowel wall thickening, distention or inflammation. Vascular/Lymphatic: Retroaortic left renal vein, incidentally noted. Trace abdominal aortic atherosclerosis. No adenopathy. Reproductive: 2 cm follicular cyst in the right ovary is likely incidental. Uterus and left ovary are unremarkable. Small amount of simple free fluid in the pelvic cul-de-sac. Other: No free air. No abdominal ascites. Small fat containing umbilical hernia. Musculoskeletal: Bilateral L5 pars interarticularis defects with trace anterolisthesis of L5 on S1. There are no acute or suspicious osseous abnormalities. IMPRESSION: 1. Cholelithiasis with a choledocholithiasis, a 2 mm stone is in the distal common bile duct. Resultant mild biliary dilatation and gallbladder  inflammation. 2. Minimal aortic atherosclerosis. Electronically Signed   By: Rubye OaksMelanie  Ehinger M.D.   On: 05/25/2016 05:05   Case discussed with dr Manus Gunningrancour in ED  Assessment/Plan 41 yo healthy female with choledocholithiasis 2mm with elevated lfts/tbili and some CBD dilation  Principal Problem:   Choledocholithiasis- keep npo and hold anticoagulants for potential ERCP later today with GI, GI consult requested in epic.  Pt will likely need eventual cholecystectomy.  Pt has deadlines to meet at work and says she cannot stay here long.  I have advised her that if all goes well, and IF the ERCP can be done today she may be able to go home tomorrow with outpatient surgical follow up if all goes well clinically.  She is agreeable to this.  No signs of cholangitis at this time.  Active Problems:   GERD- noted   Irritable bowel syndrome- noted   DVT prophylaxis: scds Code Status:  full Family Communication:  Husband at bedside Disposition Plan:  Per day team Consults called:   GI requested in EPIC Admission status:  observation   Janeane Cozart A MD [facility]  If 7PM-7AM, please contact night-coverage [email or web address] Password [email or web address]  05/25/2016, 5:41 AM

## 2016-05-25 NOTE — ED Provider Notes (Signed)
[facility]-EMER[facility]ENCY [facility] Provider Note   CSN: [id number] Arrival date & time: 05/25/16  0250     History   Chief Complaint Chief Complaint  Patient presents with  . Abdominal Pain    HPI Kathleen Stewart is a 41 y.o. female.  HPI Patient presents emergency department with development of epigastric and right upper quadrant pain that began yesterday.  She's had nausea vomiting and some diarrhea.  She's had chills without documented fever.  She never had discomfort or pain like this before.  Initially her pain was intermittent in the right upper quadrant now her pain is constant.   Past Medical History:  Diagnosis Date  . Abdominal pain   . Abnormal Pap smear   . Anxiety   . Bloating   . Bulging lumbar disc   . Chronic back pain   . Chronic diarrhea   . Depression   . Gastroesophageal reflux   . History of abnormal cervical Pap smear 10/25/2013  . History of abnormal Pap smear 10/19/2012  . Insomnia   . Irritable bowel syndrome   . Menorrhagia 04/25/2014  . Night sweats 04/25/2014  . Pelvic pain in female 04/25/2014  . Vaginal Pap smear, abnormal     Patient Active Problem List   Diagnosis Date Noted  . Menorrhagia 04/25/2014  . Night sweats 04/25/2014  . Pelvic pain in female 04/25/2014  . History of abnormal cervical Pap smear 10/25/2013  . History of abnormal Pap smear 10/19/2012  . ABDOMINAL BLOATING 04/12/2009  . IRRITABLE BOWEL SYNDROME 01/02/2009  . ANXIETY 11/30/2008  . GERD 11/30/2008  . BACK PAIN, CHRONIC 11/30/2008  . DIARRHEA, CHRONIC 11/30/2008  . ABDOMINAL PAIN 11/30/2008  . FRACTURE, PELVIS, RIGHT 11/30/2008    Past Surgical History:  Procedure Laterality Date  . COLONOSCOPY  12/24/2008  . DILITATION & CURRETTAGE/HYSTROSCOPY WITH NOVASURE ABLATION N/A 05/22/2014   Procedure: DILATATION & CURETTAGE/HYSTEROSCOPY WITH NOVASURE ABLATION (cavity length 5.5 cm, cavity width 4.2 cm; power 127 watts, time 1 minute);  Surgeon: Tilda BurrowJohn Ferguson V, MD;  Location: [facility];  Service: Gynecology;  Laterality: N/A;  . HYDROGEN BREATH TEST  01/29/2009  . UPPER GASTROINTESTINAL ENDOSCOPY  12/24/2008    OB History    Gravida Para Term Preterm AB Living   5 2     3 2    SAB TAB Ectopic Multiple Live Births   1 2     2        Home Medications    Prior to Admission medications   Medication Sig Start Date End Date Taking? Authorizing Provider  Alpha-D-Galactosidase Charlyne Quale(BEANO) TABS Take 2 tablets by mouth as needed (gas relief).     Historical Provider, MD  ALPRAZolam Prudy Feeler(XANAX) 1 MG tablet Take 1 mg by mouth at bedtime.    Historical Provider, MD  escitalopram (LEXAPRO) 20 MG tablet Take 20 mg by mouth daily.    Historical Provider, MD  HYDROcodone-acetaminophen (NORCO/VICODIN) 5-325 MG tablet Take 1 tablet by mouth daily as needed for moderate pain.    Historical Provider, MD  ibuprofen (ADVIL,MOTRIN) 200 MG tablet Take 800 mg by mouth every 6 (six) hours as needed for headache or cramping.    Historical Provider, MD  ibuprofen (ADVIL,MOTRIN) 800 MG tablet Take 1 tablet (800 mg total) by mouth 3 (three) times daily. 12/25/15   Lavera Guiseana Duo Liu, MD  Multiple Vitamins-Minerals (HAIR/SKIN/NAILS PO) Take 1 tablet by mouth daily.    Historical Provider, MD  omeprazole (PRILOSEC) 40 MG capsule Take 40 mg by mouth  2 (two) times daily.    Historical Provider, MD  ondansetron (ZOFRAN) 4 MG tablet Take 4 mg by mouth every 8 (eight) hours as needed for nausea or vomiting.    Historical Provider, MD  Probiotic Product (TRUBIOTICS PO) Take 1 tablet by mouth daily.     Historical Provider, MD    Family History Family History  Problem Relation Age of Onset  . Emphysema Father   . Other Mother     fibroid tumors-had hyst    Social History Social History  Substance Use Topics  . Smoking status: Current Every Day Smoker    Packs/day: 1.00    Years: 20.00    Types: Cigarettes  . Smokeless tobacco: Never Used  . Alcohol use Yes     Comment: socially     Allergies     Adhesive [tape]   Review of Systems Review of Systems  All other systems reviewed and are negative.    Physical Exam Updated Vital Signs BP (!) 115/58 (BP Location: Right Arm)   Pulse 71   Temp 97.6 F (36.4 C) (Oral)   Resp 20   Ht 5\' 4"  (1.626 m)   Wt 190 lb (86.2 kg)   SpO2 96%   BMI 32.61 kg/m   Physical Exam  Constitutional: She is oriented to person, place, and time. She appears well-developed and well-nourished.  Uncomfortable appearing  HENT:  Head: Normocephalic and atraumatic.  Eyes: EOM are normal.  Neck: Normal range of motion.  Cardiovascular: Normal rate, regular rhythm and normal heart sounds.   Pulmonary/Chest: Effort normal and breath sounds normal.  Abdominal: Soft. She exhibits no distension.  Epigastric and right upper quadrant tenderness  Musculoskeletal: Normal range of motion.  Neurological: She is alert and oriented to person, place, and time.  Skin: Skin is warm and dry.  Psychiatric: She has a normal mood and affect. Judgment normal.  Nursing note and vitals reviewed.    ED Treatments / Results  Labs (all labs ordered are listed, but only abnormal results are displayed) Labs Reviewed  CBC - Abnormal; Notable for the following:       Result Value   WBC 10.8 (*)    All other components within normal limits  COMPREHENSIVE METABOLIC PANEL - Abnormal; Notable for the following:    Sodium 132 (*)    Chloride 99 (*)    Glucose, Bld 157 (*)    AST 294 (*)    ALT 206 (*)    Total Bilirubin 1.3 (*)    All other components within normal limits  LIPASE, BLOOD  URINALYSIS, ROUTINE W REFLEX MICROSCOPIC  I-STAT BETA HCG BLOOD, ED ([facility], [facility], [facility] ONLY)    EKG  EKG Interpretation None       Radiology Ct Abdomen Pelvis W Contrast  Result Date: 05/25/2016 CLINICAL DATA:  Upper abdominal pain.  Right upper quadrant pain. EXAM: CT ABDOMEN AND PELVIS WITH CONTRAST TECHNIQUE: Multidetector CT imaging of the abdomen and pelvis was performed  using the standard protocol following bolus administration of intravenous contrast. CONTRAST:  100 cc Isovue-300 IV COMPARISON:  None. FINDINGS: Lower chest: Dependent atelectasis at the lung bases, possible scarring in the left lower lobe. Hepatobiliary: No focal hepatic lesion. Multiple calcified gallstones within the gallbladder. Mild gallbladder wall thickening. Calcified 2 mm stone in the distal most common bile duct. Mild intra and extrahepatic biliary dilatation, the common bile duct measures at the porta hepatis. Borderline hepatic steatosis. Pancreas: No ductal dilatation or inflammation. Spleen: Normal  in size without focal abnormality. Adrenals/Urinary Tract: Normal adrenal glands. No hydronephrosis. Symmetric renal enhancement and excretion on delayed phase imaging. Urinary bladder is near completely decompressed. Stomach/Bowel: Stomach is physiologically distended with ingested contrast. The appendix is normal. Colon is decompressed limiting assessment, no evidence of colonic inflammation. No small bowel wall thickening, distention or inflammation. Vascular/Lymphatic: Retroaortic left renal vein, incidentally noted. Trace abdominal aortic atherosclerosis. No adenopathy. Reproductive: 2 cm follicular cyst in the right ovary is likely incidental. Uterus and left ovary are unremarkable. Small amount of simple free fluid in the pelvic cul-de-sac. Other: No free air. No abdominal ascites. Small fat containing umbilical hernia. Musculoskeletal: Bilateral L5 pars interarticularis defects with trace anterolisthesis of L5 on S1. There are no acute or suspicious osseous abnormalities. IMPRESSION: 1. Cholelithiasis with a choledocholithiasis, a 2 mm stone is in the distal common bile duct. Resultant mild biliary dilatation and gallbladder inflammation. 2. Minimal aortic atherosclerosis. Electronically Signed   By: Rubye Oaks M.D.   On: 05/25/2016 05:05    Procedures Procedures (including critical care  time)   ++++++++++++++++++++++++++++++++++++++++++   EMERGENCY DEPARTMENT BILIARY ULTRASOUND INTERPRETATION "Study: Limited Abdominal Ultrasound of the Gallbladder and Common Bile Duct." INDICATIONS: Abdominal pain Indication: Multiple views of the gallbladder and common bile duct were obtained in real-time with a Multi-frequency probe." PERFORMED BY:  Myself IMAGES ARCHIVED?: Yes LIMITATIONS: Body habitus and Bowel gas INTERPRETATION: Cholelithiasis, Gallbladder wall normal in thickness and unable to visualize CBD  ++++++++++++++++++++++++++++++++++++++++++++++++++++  Medications Ordered in ED Medications  0.9 %  sodium chloride infusion (0 mLs Intravenous Stopped 05/25/16 0527)    Followed by  0.9 %  sodium chloride infusion (1,000 mLs Intravenous New Bag/Given 05/25/16 0534)  nicotine (NICODERM CQ - dosed in mg/24 hours) patch 14 mg (not administered)  HYDROmorphone (DILAUDID) injection 1 mg (1 mg Intravenous Given 05/25/16 0400)  ondansetron (ZOFRAN) injection 4 mg (4 mg Intravenous Given 05/25/16 0359)  iopamidol (ISOVUE-300) 61 % injection 100 mL (100 mLs Intravenous Contrast Given 05/25/16 0420)  iopamidol (ISOVUE-300) 61 % injection 30 mL (30 mLs Oral Contrast Given 05/25/16 0420)     Initial Impression / Assessment and Plan / ED Course  I have reviewed the triage vital signs and the nursing notes.  Pertinent labs & imaging results that were available during my care of the patient were reviewed by me and considered in my medical decision making (see chart for details).     Patient with cholelithiasis and choledocholithiasis.  2 mm stone noted in the common bile duct on CT.  LFTs elevated.  Patient be admitted the hospital for ongoing management including GI consultation and likely ERCP.  GI consultation be called by the admitting team.  Final Clinical Impressions(s) / ED Diagnoses   Final diagnoses:  Choledocholithiasis  Elevated liver function tests    New  Prescriptions New Prescriptions   No medications on file     Azalia Bilis, MD 05/25/16 [phone]

## 2016-05-26 ENCOUNTER — Encounter (HOSPITAL_COMMUNITY): Payer: Self-pay | Admitting: *Deleted

## 2016-05-26 ENCOUNTER — Observation Stay (HOSPITAL_COMMUNITY): Payer: 59

## 2016-05-26 ENCOUNTER — Observation Stay (HOSPITAL_COMMUNITY): Payer: 59 | Admitting: Anesthesiology

## 2016-05-26 ENCOUNTER — Encounter (HOSPITAL_COMMUNITY): Payer: Self-pay | Admitting: Anesthesiology

## 2016-05-26 ENCOUNTER — Encounter (HOSPITAL_COMMUNITY)
Admission: EM | Disposition: A | Discharge status: Home / Self Care | Payer: Self-pay | Source: Home / Self Care | Attending: Internal Medicine

## 2016-05-26 ENCOUNTER — Telehealth: Payer: Self-pay | Admitting: Gastroenterology

## 2016-05-26 DIAGNOSIS — R740 Nonspecific elevation of levels of transaminase and lactic acid dehydrogenase [LDH]: Secondary | ICD-10-CM | POA: Diagnosis present

## 2016-05-26 DIAGNOSIS — R7989 Other specified abnormal findings of blood chemistry: Secondary | ICD-10-CM

## 2016-05-26 DIAGNOSIS — G8929 Other chronic pain: Secondary | ICD-10-CM | POA: Diagnosis present

## 2016-05-26 DIAGNOSIS — K8070 Calculus of gallbladder and bile duct without cholecystitis without obstruction: Secondary | ICD-10-CM | POA: Diagnosis present

## 2016-05-26 DIAGNOSIS — Z791 Long term (current) use of non-steroidal anti-inflammatories (NSAID): Secondary | ICD-10-CM | POA: Diagnosis not present

## 2016-05-26 DIAGNOSIS — Z79899 Other long term (current) drug therapy: Secondary | ICD-10-CM | POA: Diagnosis not present

## 2016-05-26 DIAGNOSIS — Z9109 Other allergy status, other than to drugs and biological substances: Secondary | ICD-10-CM | POA: Diagnosis not present

## 2016-05-26 DIAGNOSIS — K219 Gastro-esophageal reflux disease without esophagitis: Secondary | ICD-10-CM | POA: Diagnosis not present

## 2016-05-26 DIAGNOSIS — R945 Abnormal results of liver function studies: Secondary | ICD-10-CM

## 2016-05-26 DIAGNOSIS — K838 Other specified diseases of biliary tract: Secondary | ICD-10-CM

## 2016-05-26 DIAGNOSIS — K831 Obstruction of bile duct: Secondary | ICD-10-CM

## 2016-05-26 DIAGNOSIS — K580 Irritable bowel syndrome with diarrhea: Secondary | ICD-10-CM | POA: Diagnosis present

## 2016-05-26 DIAGNOSIS — G4700 Insomnia, unspecified: Secondary | ICD-10-CM | POA: Diagnosis present

## 2016-05-26 DIAGNOSIS — F329 Major depressive disorder, single episode, unspecified: Secondary | ICD-10-CM | POA: Diagnosis present

## 2016-05-26 DIAGNOSIS — K8050 Calculus of bile duct without cholangitis or cholecystitis without obstruction: Secondary | ICD-10-CM | POA: Diagnosis not present

## 2016-05-26 DIAGNOSIS — F419 Anxiety disorder, unspecified: Secondary | ICD-10-CM | POA: Diagnosis present

## 2016-05-26 DIAGNOSIS — K588 Other irritable bowel syndrome: Secondary | ICD-10-CM | POA: Diagnosis not present

## 2016-05-26 DIAGNOSIS — F17210 Nicotine dependence, cigarettes, uncomplicated: Secondary | ICD-10-CM | POA: Diagnosis present

## 2016-05-26 DIAGNOSIS — R1011 Right upper quadrant pain: Secondary | ICD-10-CM | POA: Diagnosis present

## 2016-05-26 HISTORY — PX: PANCREATIC STENT PLACEMENT: SHX5539

## 2016-05-26 HISTORY — PX: SPHINCTEROTOMY: SHX5279

## 2016-05-26 HISTORY — PX: ERCP: SHX5425

## 2016-05-26 LAB — URINALYSIS, ROUTINE W REFLEX MICROSCOPIC
Bilirubin Urine: NEGATIVE
Glucose, UA: NEGATIVE mg/dL
KETONES UR: NEGATIVE mg/dL
Leukocytes, UA: NEGATIVE
Nitrite: NEGATIVE
PROTEIN: NEGATIVE mg/dL
Specific Gravity, Urine: 1.024 (ref 1.005–1.030)
pH: 5 (ref 5.0–8.0)

## 2016-05-26 LAB — BASIC METABOLIC PANEL
ANION GAP: 6 (ref 5–15)
BUN: 6 mg/dL (ref 6–20)
CO2: 25 mmol/L (ref 22–32)
Calcium: 8.4 mg/dL — ABNORMAL LOW (ref 8.9–10.3)
Chloride: 106 mmol/L (ref 101–111)
Creatinine, Ser: 0.71 mg/dL (ref 0.44–1.00)
Glucose, Bld: 92 mg/dL (ref 65–99)
POTASSIUM: 3.8 mmol/L (ref 3.5–5.1)
SODIUM: 137 mmol/L (ref 135–145)

## 2016-05-26 LAB — CBC
HEMATOCRIT: 38 % (ref 36.0–46.0)
HEMOGLOBIN: 12.6 g/dL (ref 12.0–15.0)
MCH: 31.8 pg (ref 26.0–34.0)
MCHC: 33.2 g/dL (ref 30.0–36.0)
MCV: 96 fL (ref 78.0–100.0)
Platelets: 291 10*3/uL (ref 150–400)
RBC: 3.96 MIL/uL (ref 3.87–5.11)
RDW: 12.9 % (ref 11.5–15.5)
WBC: 6.5 10*3/uL (ref 4.0–10.5)

## 2016-05-26 LAB — HEPATIC FUNCTION PANEL
ALBUMIN: 3.4 g/dL — AB (ref 3.5–5.0)
ALK PHOS: 64 U/L (ref 38–126)
ALT: 138 U/L — ABNORMAL HIGH (ref 14–54)
ALT: 101 U/L — ABNORMAL HIGH (ref 14–54)
AST: 76 U/L — AB (ref 15–41)
AST: 49 U/L — AB (ref 15–41)
Albumin: 3.4 g/dL — ABNORMAL LOW (ref 3.5–5.0)
Alkaline Phosphatase: 61 U/L (ref 38–126)
BILIRUBIN DIRECT: 0.1 mg/dL (ref 0.1–0.5)
BILIRUBIN TOTAL: 0.6 mg/dL (ref 0.3–1.2)
BILIRUBIN TOTAL: 0.7 mg/dL (ref 0.3–1.2)
Bilirubin, Direct: 0.1 mg/dL (ref 0.1–0.5)
Indirect Bilirubin: 0.5 mg/dL (ref 0.3–0.9)
Indirect Bilirubin: 0.6 mg/dL (ref 0.3–0.9)
TOTAL PROTEIN: 6 g/dL — AB (ref 6.5–8.1)
Total Protein: 6 g/dL — ABNORMAL LOW (ref 6.5–8.1)

## 2016-05-26 SURGERY — ERCP, WITH INTERVENTION IF INDICATED
Anesthesia: General

## 2016-05-26 MED ORDER — MIDAZOLAM HCL 5 MG/5ML IJ SOLN
INTRAMUSCULAR | Status: DC | PRN
  Administered 2016-05-26: 2 mg via INTRAVENOUS

## 2016-05-26 MED ORDER — SUCCINYLCHOLINE 20MG/ML (10ML) SYRINGE FOR MEDFUSION PUMP - OPTIME
INTRAMUSCULAR | Status: DC | PRN
  Administered 2016-05-26: 100 mg via INTRAVENOUS

## 2016-05-26 MED ORDER — GLUCAGON HCL RDNA (DIAGNOSTIC) 1 MG IJ SOLR
INTRAMUSCULAR | Status: AC
  Filled 2016-05-26: qty 2

## 2016-05-26 MED ORDER — LACTATED RINGERS IV SOLN
INTRAVENOUS | Status: DC | PRN
  Administered 2016-05-26 (×2): via INTRAVENOUS

## 2016-05-26 MED ORDER — GLUCAGON HCL RDNA (DIAGNOSTIC) 1 MG IJ SOLR
INTRAMUSCULAR | Status: DC | PRN
  Administered 2016-05-26 (×2): .5 mg via INTRAVENOUS
  Administered 2016-05-26: .25 mg via INTRAVENOUS

## 2016-05-26 MED ORDER — LORAZEPAM 2 MG/ML IJ SOLN
INTRAMUSCULAR | Status: AC
  Filled 2016-05-26: qty 1

## 2016-05-26 MED ORDER — SIMETHICONE 40 MG/0.6ML PO SUSP
ORAL | Status: AC
  Filled 2016-05-26: qty 0.6

## 2016-05-26 MED ORDER — SODIUM CHLORIDE 0.9 % IV SOLN: INTRAVENOUS | Status: DC

## 2016-05-26 MED ORDER — PROPOFOL 10 MG/ML IV BOLUS
INTRAVENOUS | Status: DC | PRN
  Administered 2016-05-26: 160 mg via INTRAVENOUS

## 2016-05-26 MED ORDER — LIDOCAINE HCL (CARDIAC) 10 MG/ML IV SOLN
INTRAVENOUS | Status: DC | PRN
  Administered 2016-05-26: 50 mg via INTRAVENOUS

## 2016-05-26 MED ORDER — MIDAZOLAM HCL 2 MG/2ML IJ SOLN
INTRAMUSCULAR | Status: AC
  Filled 2016-05-26: qty 2

## 2016-05-26 MED ORDER — GADOBENATE DIMEGLUMINE 529 MG/ML IV SOLN
18.0000 mL | Freq: Once | INTRAVENOUS | Status: AC | PRN
  Administered 2016-05-26: 18 mL via INTRAVENOUS

## 2016-05-26 MED ORDER — FENTANYL CITRATE (PF) 250 MCG/5ML IJ SOLN
INTRAMUSCULAR | Status: AC
  Filled 2016-05-26: qty 5

## 2016-05-26 MED ORDER — IOPAMIDOL (ISOVUE-300) INJECTION 61%
INTRAVENOUS | Status: AC
  Filled 2016-05-26: qty 100

## 2016-05-26 MED ORDER — LIDOCAINE HCL (PF) 1 % IJ SOLN
INTRAMUSCULAR | Status: AC
  Filled 2016-05-26: qty 5

## 2016-05-26 MED ORDER — SUCCINYLCHOLINE CHLORIDE 20 MG/ML IJ SOLN
INTRAMUSCULAR | Status: AC
  Filled 2016-05-26: qty 1

## 2016-05-26 MED ORDER — ONDANSETRON HCL 4 MG/2ML IJ SOLN
INTRAMUSCULAR | Status: DC | PRN
  Administered 2016-05-26 (×2): 4 mg via INTRAVENOUS

## 2016-05-26 MED ORDER — IOPAMIDOL (ISOVUE-300) INJECTION 61%
INTRAVENOUS | Status: DC | PRN
  Administered 2016-05-26: 50 mL

## 2016-05-26 MED ORDER — PROMETHAZINE HCL 25 MG/ML IJ SOLN
INTRAMUSCULAR | Status: DC | PRN
Start: 2016-05-26 — End: 2016-05-26
  Administered 2016-05-26: 12.5 mg via INTRAVENOUS

## 2016-05-26 MED ORDER — FENTANYL CITRATE (PF) 100 MCG/2ML IJ SOLN
INTRAMUSCULAR | Status: DC | PRN
  Administered 2016-05-26: 25 ug via INTRAVENOUS
  Administered 2016-05-26 (×3): 50 ug via INTRAVENOUS
  Administered 2016-05-26: 25 ug via INTRAVENOUS

## 2016-05-26 MED ORDER — LORAZEPAM 2 MG/ML IJ SOLN
2.0000 mg | Freq: Once | INTRAMUSCULAR | Status: AC
  Administered 2016-05-26: 2 mg via INTRAVENOUS

## 2016-05-26 MED ORDER — NICOTINE 21 MG/24HR TD PT24
21.0000 mg | MEDICATED_PATCH | Freq: Every day | TRANSDERMAL | Status: DC
  Administered 2016-05-26: 21 mg via TRANSDERMAL
  Filled 2016-05-26: qty 1

## 2016-05-26 MED ORDER — ROCURONIUM BROMIDE 50 MG/5ML IV SOLN
INTRAVENOUS | Status: AC
  Filled 2016-05-26: qty 1

## 2016-05-26 MED ORDER — ROCURONIUM 10MG/ML (10ML) SYRINGE FOR MEDFUSION PUMP - OPTIME
INTRAVENOUS | Status: DC | PRN
  Administered 2016-05-26: 10 mg via INTRAVENOUS
  Administered 2016-05-26: 25 mg via INTRAVENOUS
  Administered 2016-05-26: 5 mg via INTRAVENOUS

## 2016-05-26 NOTE — OR Nursing (Signed)
Family updated.

## 2016-05-26 NOTE — Plan of Care (Signed)
Patient IV patent.  RN assessment and VS revealed stability for transport to procedure.  Consent signed and CHG x2 completed.  Patient has been NPO since midnight.

## 2016-05-26 NOTE — Anesthesia Procedure Notes (Signed)
Procedure Name: Intubation Date/Time: 05/26/2016 1:27 PM Performed by: Tressie Stalker E Pre-anesthesia Checklist: Patient identified, Patient being monitored, Timeout performed, Emergency Drugs available and Suction available Patient Re-evaluated:Patient Re-evaluated prior to inductionOxygen Delivery Method: Circle system utilized Preoxygenation: Pre-oxygenation with 100% oxygen Intubation Type: IV induction, Rapid sequence and Cricoid Pressure applied Ventilation: Mask ventilation without difficulty Laryngoscope Size: Mac and 3 Grade View: Grade I Tube type: Oral Tube size: 7.0 mm Number of attempts: 1 Airway Equipment and Method: Stylet Placement Confirmation: ETT inserted through vocal cords under direct vision,  positive ETCO2 and breath sounds checked- equal and bilateral Secured at: 21 cm Tube secured with: Tape Dental Injury: Teeth and Oropharynx as per pre-operative assessment

## 2016-05-26 NOTE — Anesthesia Postprocedure Evaluation (Signed)
Anesthesia Post Note  Patient: Kathleen DickJaime A Dresden  Procedure(s) Performed: Procedure(s) (LRB): ENDOSCOPIC RETROGRADE CHOLANGIOPANCREATOGRAPHY (ERCP) (N/A) SPHINCTEROTOMY (N/A) PANCREATIC STENT PLACEMENT  Patient location during evaluation: PACU Anesthesia Type: General Level of consciousness: awake and alert and oriented Pain management: pain level controlled Vital Signs Assessment: post-procedure vital signs reviewed and stable Respiratory status: spontaneous breathing Cardiovascular status: blood pressure returned to baseline : Initial N/V upon coming to PACU.  Repeated zofran and patient feels better.     Last Vitals:  Vitals:   05/26/16 1300 05/26/16 1555  BP: 114/69 126/76  Pulse:  94  Resp: 14 11  Temp:  36.5 C    Last Pain:  Vitals:   05/26/16 1230  TempSrc: Oral  PainSc: 7                  Breylin Dom

## 2016-05-26 NOTE — Progress Notes (Signed)
PROGRESS NOTE    Kathleen Stewart  ZOX:[id number] DOB: 01/15/1976 DOA: 05/25/2016 PCP: Dwana MelenaZack Hall, MD     Brief Narrative:  41 y/o woman admitted from home on 3/26 due to abdominal pain. Found to have transaminitis and CBD stone and admission requested.   Assessment & Plan:   Principal Problem:   Choledocholithiasis Active Problems:   GERD   Irritable bowel syndrome   Elevated LFTs   Choledocholithiasis -LFTs improved; unfortunately, MRCP shows retained CBD stone and as such is scheduled for ERCP. -Appreciate GI input and evaluation. -Per surgery, can probably have cholecystectomy at a later date. -Recheck LFTs in am.  Tobacco Abuse -Start nicotine patch.   DVT prophylaxis: SCDs Code Status: full code Family Communication: husband and son at bedside updated on plan of care and all questions answered. Disposition Plan: For ERCP today  Consultants:   GI  Procedures:   ERCP pending  Antimicrobials:  Anti-infectives    None       Subjective: Still with RUQ abd pain today. Wants to eat, wants to smoke  Objective: Vitals:   05/26/16 1230 05/26/16 1245 05/26/16 1300 05/26/16 1555  BP: 114/73 114/70 114/69 126/76  Pulse: 66   94  Resp: 18 18 14 11   Temp: 98.3 F (36.8 C)   97.7 F (36.5 C)  TempSrc: Oral     SpO2: 93% 93% (!) 88% 99%  Weight:      Height:        Intake/Output Summary (Last 24 hours) at 05/26/16 1614 Last data filed at 05/26/16 1545  Gross per 24 hour  Intake             2020 ml  Output              100 ml  Net             1920 ml   Filed Weights   05/25/16 0302  Weight: 86.2 kg (190 lb)    Examination:  General exam: Alert, awake, oriented x 3 Respiratory system: Clear to auscultation. Respiratory effort normal. Cardiovascular system:RRR. No murmurs, rubs, gallops. Gastrointestinal system: Abdomen is nondistended, soft and nontender. No organomegaly or masses felt. Normal bowel sounds heard. Central nervous system: Alert  and oriented. No focal neurological deficits. Extremities: No C/C/E, +pedal pulses Skin: No rashes, lesions or ulcers Psychiatry: Judgement and insight appear normal. Mood & affect appropriate.     Data Reviewed: I have personally reviewed following labs and imaging studies  CBC:  Recent Labs Lab 05/25/16 0357 05/26/16 0504  WBC 10.8* 6.5  HGB 13.9 12.6  HCT 40.4 38.0  MCV 93.3 96.0  PLT 327 291   Basic Metabolic Panel:  Recent Labs Lab 05/25/16 0357 05/26/16 0504  NA 132* 137  K 3.7 3.8  CL 99* 106  CO2 25 25  GLUCOSE 157* 92  BUN 11 6  CREATININE 0.82 0.71  CALCIUM 9.0 8.4*   GFR: Estimated Creatinine Clearance: 98.3 mL/min (by C-G formula based on SCr of 0.71 mg/dL). Liver Function Tests:  Recent Labs Lab 05/25/16 0357 05/26/16 0504  AST 294* 76*  ALT 206* 138*  ALKPHOS 80 64  BILITOT 1.3* 0.6  PROT 7.1 6.0*  ALBUMIN 4.1 3.4*    Recent Labs Lab 05/25/16 0357  LIPASE 25   No results for input(s): AMMONIA in the last 168 hours. Coagulation Profile: No results for input(s): INR, PROTIME in the last 168 hours. Cardiac Enzymes: No results for input(s): CKTOTAL, CKMB, CKMBINDEX, TROPONINI  in the last 168 hours. BNP (last 3 results) No results for input(s): PROBNP in the last 8760 hours. HbA1C: No results for input(s): HGBA1C in the last 72 hours. CBG: No results for input(s): GLUCAP in the last 168 hours. Lipid Profile: No results for input(s): CHOL, HDL, LDLCALC, TRIG, CHOLHDL, LDLDIRECT in the last 72 hours. Thyroid Function Tests: No results for input(s): TSH, T4TOTAL, FREET4, T3FREE, THYROIDAB in the last 72 hours. Anemia Panel: No results for input(s): VITAMINB12, FOLATE, FERRITIN, TIBC, IRON, RETICCTPCT in the last 72 hours. Urine analysis:    Component Value Date/Time   COLORURINE YELLOW 05/26/2016 1201   APPEARANCEUR CLEAR 05/26/2016 1201   LABSPEC 1.024 05/26/2016 1201   PHURINE 5.0 05/26/2016 1201   GLUCOSEU NEGATIVE 05/26/2016  1201   HGBUR SMALL (A) 05/26/2016 1201   BILIRUBINUR NEGATIVE 05/26/2016 1201   KETONESUR NEGATIVE 05/26/2016 1201   PROTEINUR NEGATIVE 05/26/2016 1201   UROBILINOGEN 0.2 05/21/2014 1405   NITRITE NEGATIVE 05/26/2016 1201   LEUKOCYTESUR NEGATIVE 05/26/2016 1201   Sepsis Labs: @LABRCNTIP (procalcitonin:4,lacticidven:4)  ) Recent Results (from the past 240 hour(s))  Surgical pcr screen     Status: None   Collection Time: 05/25/16  8:03 PM  Result Value Ref Range Status   MRSA, PCR NEGATIVE NEGATIVE Final   Staphylococcus aureus NEGATIVE NEGATIVE Final    Comment:        The Xpert SA Assay (FDA approved for NASAL specimens in patients over 34 years of age), is one component of a comprehensive surveillance program.  Test performance has been validated by [facility] for patients greater than or equal to 64 year old. It is not intended to diagnose infection nor to guide or monitor treatment.          Radiology Studies: Ct Abdomen Pelvis W Contrast  Result Date: 05/25/2016 CLINICAL DATA:  Upper abdominal pain.  Right upper quadrant pain. EXAM: CT ABDOMEN AND PELVIS WITH CONTRAST TECHNIQUE: Multidetector CT imaging of the abdomen and pelvis was performed using the standard protocol following bolus administration of intravenous contrast. CONTRAST:  100 cc Isovue-300 IV COMPARISON:  None. FINDINGS: Lower chest: Dependent atelectasis at the lung bases, possible scarring in the left lower lobe. Hepatobiliary: No focal hepatic lesion. Multiple calcified gallstones within the gallbladder. Mild gallbladder wall thickening. Calcified 2 mm stone in the distal most common bile duct. Mild intra and extrahepatic biliary dilatation, the common bile duct measures at the porta hepatis. Borderline hepatic steatosis. Pancreas: No ductal dilatation or inflammation. Spleen: Normal in size without focal abnormality. Adrenals/Urinary Tract: Normal adrenal glands. No hydronephrosis. Symmetric renal  enhancement and excretion on delayed phase imaging. Urinary bladder is near completely decompressed. Stomach/Bowel: Stomach is physiologically distended with ingested contrast. The appendix is normal. Colon is decompressed limiting assessment, no evidence of colonic inflammation. No small bowel wall thickening, distention or inflammation. Vascular/Lymphatic: Retroaortic left renal vein, incidentally noted. Trace abdominal aortic atherosclerosis. No adenopathy. Reproductive: 2 cm follicular cyst in the right ovary is likely incidental. Uterus and left ovary are unremarkable. Small amount of simple free fluid in the pelvic cul-de-sac. Other: No free air. No abdominal ascites. Small fat containing umbilical hernia. Musculoskeletal: Bilateral L5 pars interarticularis defects with trace anterolisthesis of L5 on S1. There are no acute or suspicious osseous abnormalities. IMPRESSION: 1. Cholelithiasis with a choledocholithiasis, a 2 mm stone is in the distal common bile duct. Resultant mild biliary dilatation and gallbladder inflammation. 2. Minimal aortic atherosclerosis. Electronically Signed   By: Lujean Rave.D.  On: 05/25/2016 05:05   Mr 3d Recon At Scanner  Result Date: 05/26/2016 CLINICAL DATA:  Upper abdominal pain and swelling. EXAM: MRI ABDOMEN WITHOUT AND WITH CONTRAST (INCLUDING MRCP) TECHNIQUE: Multiplanar multisequence MR imaging of the abdomen was performed both before and after the administration of intravenous contrast. Heavily T2-weighted images of the biliary and pancreatic ducts were obtained, and three-dimensional MRCP images were rendered by post processing. CONTRAST:  18mL MULTIHANCE GADOBENATE DIMEGLUMINE 529 MG/ML IV SOLN COMPARISON:  CT scan 05/25/2016 FINDINGS: Despite efforts by the technologist and patient, motion artifact is present on today's exam and could not be eliminated. This reduces exam sensitivity and specificity. Lower chest: Linear subsegmental atelectasis anteriorly in  the left lower lobe. Trace bilateral pleural effusions. Hepatobiliary: Small gallstones are present dependently in the gallbladder. Gallbladder wall thickness upper normal. No significant abnormal focal parenchymal enhancement in the liver. There is some faint accentuated enhancement along the margins of the distal common bile duct for example image 50/24, possibly due to low-grade local inflammation. 3 mm filling defect in the common bile duct on image 23/4 compatible with choledocholithiasis. The more distal stone in the ampullary region is not well seen, although sensitivity is reduced by the considerable degree of motion artifact. Pancreas:  Unremarkable.  No dorsal pancreatic duct dilatation. Spleen:  Unremarkable Adrenals/Urinary Tract:  Unremarkable Stomach/Bowel: Unremarkable Vascular/Lymphatic: Retroaortic left renal vein. Otherwise unremarkable. Other:  No supplemental non-categorized findings. Musculoskeletal: Unremarkable IMPRESSION: 1. MRI confirms several small gallstones in the gallbladder with a single stone currently visible in the common bile duct about 2.4 cm proximal to the ampulla. This may represent proximal migration of the stone previously seen in the ampulla. Currently no biliary dilatation. Gallbladder wall thickness upper normal. No focal liver lesion. 2. Trace bilateral pleural effusions with subsegmental atelectasis in the left lower lobe. Electronically Signed   By: Gaylyn Rong M.D.   On: 05/26/2016 11:07   Mr Abdomen Mrcp Vivien Rossetti Contast  Result Date: 05/26/2016 CLINICAL DATA:  Upper abdominal pain and swelling. EXAM: MRI ABDOMEN WITHOUT AND WITH CONTRAST (INCLUDING MRCP) TECHNIQUE: Multiplanar multisequence MR imaging of the abdomen was performed both before and after the administration of intravenous contrast. Heavily T2-weighted images of the biliary and pancreatic ducts were obtained, and three-dimensional MRCP images were rendered by post processing. CONTRAST:  18mL  MULTIHANCE GADOBENATE DIMEGLUMINE 529 MG/ML IV SOLN COMPARISON:  CT scan 05/25/2016 FINDINGS: Despite efforts by the technologist and patient, motion artifact is present on today's exam and could not be eliminated. This reduces exam sensitivity and specificity. Lower chest: Linear subsegmental atelectasis anteriorly in the left lower lobe. Trace bilateral pleural effusions. Hepatobiliary: Small gallstones are present dependently in the gallbladder. Gallbladder wall thickness upper normal. No significant abnormal focal parenchymal enhancement in the liver. There is some faint accentuated enhancement along the margins of the distal common bile duct for example image 50/24, possibly due to low-grade local inflammation. 3 mm filling defect in the common bile duct on image 23/4 compatible with choledocholithiasis. The more distal stone in the ampullary region is not well seen, although sensitivity is reduced by the considerable degree of motion artifact. Pancreas:  Unremarkable.  No dorsal pancreatic duct dilatation. Spleen:  Unremarkable Adrenals/Urinary Tract:  Unremarkable Stomach/Bowel: Unremarkable Vascular/Lymphatic: Retroaortic left renal vein. Otherwise unremarkable. Other:  No supplemental non-categorized findings. Musculoskeletal: Unremarkable IMPRESSION: 1. MRI confirms several small gallstones in the gallbladder with a single stone currently visible in the common bile duct about 2.4 cm proximal to the ampulla. This  may represent proximal migration of the stone previously seen in the ampulla. Currently no biliary dilatation. Gallbladder wall thickness upper normal. No focal liver lesion. 2. Trace bilateral pleural effusions with subsegmental atelectasis in the left lower lobe. Electronically Signed   By: Gaylyn Rong M.D.   On: 05/26/2016 11:07        Scheduled Meds: . [MAR Hold] Chlorhexidine Gluconate Cloth  6 each Topical Q0600  . iopamidol      . LORazepam      . [MAR Hold] nicotine  21 mg  Transdermal Daily  . simethicone       Continuous Infusions: . sodium chloride    . sodium chloride 1,000 mL (05/25/16 2329)  . sodium chloride       LOS: 0 days    Time spent: 25 minutes. Greater than 50% of this time was spent in direct contact with the patient coordinating care.     Chaya Jan, MD [facility] Pager [phone]  If 7PM-7AM, please contact night-coverage [email or web address] Password [facility] 05/26/2016, 4:14 PM

## 2016-05-26 NOTE — H&P (View-Only) (Signed)
Subjective: Abdominal pain still present but improved from admission. No N/V. Tolerated full liquids yesterday evening. Afebrile.   Objective: Vital signs in last 24 hours: Temp:  [97.6 F (36.4 C)-97.8 F (36.6 C)] 97.7 F (36.5 C) (03/27 0517) Pulse Rate:  [63-76] 76 (03/27 0517) Resp:  [18] 18 (03/27 0517) BP: (104-118)/(55-61) 104/55 (03/27 0517) SpO2:  [92 %-99 %] 92 % (03/27 0517) Last BM Date: 05/24/16 General:   Alert and oriented, pleasant Head:  Normocephalic and atraumatic. Eyes:  No icterus, sclera clear. Conjuctiva pink.  Mouth:  Without lesions, mucosa pink and moist.  Abdomen:  Bowel sounds present, soft, TTP epigastric/LUQ/RUQ, no rebound or guarding Extremities:  Without edema. Neurologic:  Alert and  oriented x4 Psych:  Alert and cooperative. Normal mood and affect.  Intake/Output from previous day: 03/26 0701 - 03/27 0700 In: 1574.2 [I.V.:1574.2] Out: -  Intake/Output this shift: No intake/output data recorded.  Lab Results:  Recent Labs  05/25/16 0357 05/26/16 0504  WBC 10.8* 6.5  HGB 13.9 12.6  HCT 40.4 38.0  PLT 327 291   BMET  Recent Labs  05/25/16 0357 05/26/16 0504  NA 132* 137  K 3.7 3.8  CL 99* 106  CO2 25 25  GLUCOSE 157* 92  BUN 11 6  CREATININE 0.82 0.71  CALCIUM 9.0 8.4*   LFT  Recent Labs  05/25/16 0357 05/26/16 0504  PROT 7.1 6.0*  ALBUMIN 4.1 3.4*  AST 294* 76*  ALT 206* 138*  ALKPHOS 80 64  BILITOT 1.3* 0.6  BILIDIR  --  0.1  IBILI  --  0.5     Studies/Results: Ct Abdomen Pelvis W Contrast  Result Date: 05/25/2016 CLINICAL DATA:  Upper abdominal pain.  Right upper quadrant pain. EXAM: CT ABDOMEN AND PELVIS WITH CONTRAST TECHNIQUE: Multidetector CT imaging of the abdomen and pelvis was performed using the standard protocol following bolus administration of intravenous contrast. CONTRAST:  100 cc Isovue-300 IV COMPARISON:  None. FINDINGS: Lower chest: Dependent atelectasis at the lung bases, possible  scarring in the left lower lobe. Hepatobiliary: No focal hepatic lesion. Multiple calcified gallstones within the gallbladder. Mild gallbladder wall thickening. Calcified 2 mm stone in the distal most common bile duct. Mild intra and extrahepatic biliary dilatation, the common bile duct measures at the porta hepatis. Borderline hepatic steatosis. Pancreas: No ductal dilatation or inflammation. Spleen: Normal in size without focal abnormality. Adrenals/Urinary Tract: Normal adrenal glands. No hydronephrosis. Symmetric renal enhancement and excretion on delayed phase imaging. Urinary bladder is near completely decompressed. Stomach/Bowel: Stomach is physiologically distended with ingested contrast. The appendix is normal. Colon is decompressed limiting assessment, no evidence of colonic inflammation. No small bowel wall thickening, distention or inflammation. Vascular/Lymphatic: Retroaortic left renal vein, incidentally noted. Trace abdominal aortic atherosclerosis. No adenopathy. Reproductive: 2 cm follicular cyst in the right ovary is likely incidental. Uterus and left ovary are unremarkable. Small amount of simple free fluid in the pelvic cul-de-sac. Other: No free air. No abdominal ascites. Small fat containing umbilical hernia. Musculoskeletal: Bilateral L5 pars interarticularis defects with trace anterolisthesis of L5 on S1. There are no acute or suspicious osseous abnormalities. IMPRESSION: 1. Cholelithiasis with a choledocholithiasis, a 2 mm stone is in the distal common bile duct. Resultant mild biliary dilatation and gallbladder inflammation. 2. Minimal aortic atherosclerosis. Electronically Signed   By: Rubye OaksMelanie  Ehinger M.D.   On: 05/25/2016 05:05    Assessment: 41 year old female presenting with choledocholithiasis, no evidence of cholangitis, improved LFTs this morning. Will proceed with  MRI/MRCP this morning to assess if stone passed. Spoke with Vincenza Hews in MRI, and this will be done around 10 or so.  Discussed with patient, who is agreeable to MRI/MRCP this morning.    Plan: MRI/MRCP Patient would like to be blindfolded prior to entering MRI room (relayed this to [facility]) Imaging will be reviewed when available and decide if ERCP is indicated Spoke with Dr. Lovell Sheehan briefly regarding patient's hospital course. He will see her as an outpatient for evaluation for cholecystectomy; no need to see as inpatient currently.   Gelene Mink, ANP-BC [facility]      LOS: 0 days    05/26/2016, 7:44 AM

## 2016-05-26 NOTE — Telephone Encounter (Signed)
PT NEEDS EGD W/ MAC TO REMOVE STENT FROM PANCREATIC DUCT NEXT TUES APR 3 OR APR 10.

## 2016-05-26 NOTE — Anesthesia Preprocedure Evaluation (Signed)
Anesthesia Evaluation  Patient identified by MRN, date of birth, ID band Patient awake  General Assessment Comment:Not on a beta blocker  Reviewed: Allergy & Precautions, NPO status , Patient's Chart, lab work & pertinent test results, reviewed documented beta blocker date and time   History of Anesthesia Complications Negative for: history of anesthetic complications  Airway Mallampati: I  TM Distance: >3 FB Neck ROM: Full    Dental  (+) Partial Upper, Teeth Intact   Pulmonary Current Smoker,    breath sounds clear to auscultation       Cardiovascular negative cardio ROS   Rhythm:Regular Rate:Normal     Neuro/Psych Anxiety negative neurological ROS     GI/Hepatic Neg liver ROS, GERD  Medicated and Controlled,IBS   Endo/Other  negative endocrine ROS  Renal/GU negative Renal ROS     Musculoskeletal Chronic back pain   Abdominal   Peds  Hematology negative hematology ROS (+)   Anesthesia Other Findings   Reproductive/Obstetrics                             Anesthesia Physical Anesthesia Plan  ASA: II and emergent  Anesthesia Plan: General   Post-op Pain Management:    Induction: Intravenous, Rapid sequence and Cricoid pressure planned  Airway Management Planned: Oral ETT  Additional Equipment:   Intra-op Plan:   Post-operative Plan: Extubation in OR  Informed Consent:   Plan Discussed with: Surgeon  Anesthesia Plan Comments:         Anesthesia Quick Evaluation

## 2016-05-26 NOTE — Progress Notes (Signed)
Subjective: Abdominal pain still present but improved from admission. No N/V. Tolerated full liquids yesterday evening. Afebrile.   Objective: Vital signs in last 24 hours: Temp:  [97.6 F (36.4 C)-97.8 F (36.6 C)] 97.7 F (36.5 C) (03/27 0517) Pulse Rate:  [63-76] 76 (03/27 0517) Resp:  [18] 18 (03/27 0517) BP: (104-118)/(55-61) 104/55 (03/27 0517) SpO2:  [92 %-99 %] 92 % (03/27 0517) Last BM Date: 05/24/16 General:   Alert and oriented, pleasant Head:  Normocephalic and atraumatic. Eyes:  No icterus, sclera clear. Conjuctiva pink.  Mouth:  Without lesions, mucosa pink and moist.  Abdomen:  Bowel sounds present, soft, TTP epigastric/LUQ/RUQ, no rebound or guarding Extremities:  Without edema. Neurologic:  Alert and  oriented x4 Psych:  Alert and cooperative. Normal mood and affect.  Intake/Output from previous day: 03/26 0701 - 03/27 0700 In: 1574.2 [I.V.:1574.2] Out: -  Intake/Output this shift: No intake/output data recorded.  Lab Results:  Recent Labs  05/25/16 0357 05/26/16 0504  WBC 10.8* 6.5  HGB 13.9 12.6  HCT 40.4 38.0  PLT 327 291   BMET  Recent Labs  05/25/16 0357 05/26/16 0504  NA 132* 137  K 3.7 3.8  CL 99* 106  CO2 25 25  GLUCOSE 157* 92  BUN 11 6  CREATININE 0.82 0.71  CALCIUM 9.0 8.4*   LFT  Recent Labs  05/25/16 0357 05/26/16 0504  PROT 7.1 6.0*  ALBUMIN 4.1 3.4*  AST 294* 76*  ALT 206* 138*  ALKPHOS 80 64  BILITOT 1.3* 0.6  BILIDIR  --  0.1  IBILI  --  0.5     Studies/Results: Ct Abdomen Pelvis W Contrast  Result Date: 05/25/2016 CLINICAL DATA:  Upper abdominal pain.  Right upper quadrant pain. EXAM: CT ABDOMEN AND PELVIS WITH CONTRAST TECHNIQUE: Multidetector CT imaging of the abdomen and pelvis was performed using the standard protocol following bolus administration of intravenous contrast. CONTRAST:  100 cc Isovue-300 IV COMPARISON:  None. FINDINGS: Lower chest: Dependent atelectasis at the lung bases, possible  scarring in the left lower lobe. Hepatobiliary: No focal hepatic lesion. Multiple calcified gallstones within the gallbladder. Mild gallbladder wall thickening. Calcified 2 mm stone in the distal most common bile duct. Mild intra and extrahepatic biliary dilatation, the common bile duct measures at the porta hepatis. Borderline hepatic steatosis. Pancreas: No ductal dilatation or inflammation. Spleen: Normal in size without focal abnormality. Adrenals/Urinary Tract: Normal adrenal glands. No hydronephrosis. Symmetric renal enhancement and excretion on delayed phase imaging. Urinary bladder is near completely decompressed. Stomach/Bowel: Stomach is physiologically distended with ingested contrast. The appendix is normal. Colon is decompressed limiting assessment, no evidence of colonic inflammation. No small bowel wall thickening, distention or inflammation. Vascular/Lymphatic: Retroaortic left renal vein, incidentally noted. Trace abdominal aortic atherosclerosis. No adenopathy. Reproductive: 2 cm follicular cyst in the right ovary is likely incidental. Uterus and left ovary are unremarkable. Small amount of simple free fluid in the pelvic cul-de-sac. Other: No free air. No abdominal ascites. Small fat containing umbilical hernia. Musculoskeletal: Bilateral L5 pars interarticularis defects with trace anterolisthesis of L5 on S1. There are no acute or suspicious osseous abnormalities. IMPRESSION: 1. Cholelithiasis with a choledocholithiasis, a 2 mm stone is in the distal common bile duct. Resultant mild biliary dilatation and gallbladder inflammation. 2. Minimal aortic atherosclerosis. Electronically Signed   By: Rubye OaksMelanie  Ehinger M.D.   On: 05/25/2016 05:05    Assessment: 41 year old female presenting with choledocholithiasis, no evidence of cholangitis, improved LFTs this morning. Will proceed with  MRI/MRCP this morning to assess if stone passed. Spoke with Kathleen Stewart in MRI, and this will be done around 10 or so.  Discussed with patient, who is agreeable to MRI/MRCP this morning.    Plan: MRI/MRCP Patient would like to be blindfolded prior to entering MRI room (relayed this to [facility]) Imaging will be reviewed when available and decide if ERCP is indicated Spoke with Dr. Lovell Sheehan briefly regarding patient's hospital course. He will see her as an outpatient for evaluation for cholecystectomy; no need to see as inpatient currently.   Gelene Mink, ANP-BC [facility]      LOS: 0 days    05/26/2016, 7:44 AM

## 2016-05-26 NOTE — Interval H&P Note (Signed)
History and Physical Interval Note:  05/26/2016 12:45 PM  Kathleen DickJaime A Montavon  has presented today for surgery, with the diagnosis of choledocholithiasis  The various methods of treatment have been discussed with the patient and family. After consideration of risks, benefits and other options for treatment, the patient has consented to  Procedure(s): ENDOSCOPIC RETROGRADE CHOLANGIOPANCREATOGRAPHY (ERCP) (N/A) as a surgical intervention .  The patient's history has been reviewed, patient examined, no change in status, stable for surgery.  I have reviewed the patient's chart and labs.  Questions were answered to the patient's satisfaction.     [employer]aton CorporationSandi Daje Stark

## 2016-05-26 NOTE — Op Note (Addendum)
[facility] Patient Name: Kathleen Stewart Procedure Date: 05/26/2016 12:50 PM MRN: [id number] Date of Birth: 1975/04/27 Attending MD: Barney Drain , MD CSN: [id number] Age: 41 Admit Type: Inpatient Procedure:                ERCP with sphincterotomy, STONE EXTRACTION, PD                            STENT PLACEMENT Indications:              Bile duct stone(s) Providers:                Barney Drain, MD, Otis Peak B. Sharon Seller, RN, Aram Candela, Hildred Laser, MD Referring MD:             Delphina Cahill, MD Medicines:                General Anesthesia Complications:            No immediate complications. PT VOMITED IN PACU. Estimated Blood Loss:     Estimated blood loss: none. Procedure:                Pre-Anesthesia Assessment:                           - Prior to the procedure, a History and Physical                            was performed, and patient medications and                            allergies were reviewed. The patient's tolerance of                            previous anesthesia was also reviewed. The risks                            and benefits of the procedure and the sedation                            options and risks were discussed with the patient.                            All questions were answered, and informed consent                            was obtained. Prior Anticoagulants: The patient has                            taken no previous anticoagulant or antiplatelet                            agents. ASA Grade Assessment: II - A patient with  mild systemic disease. After reviewing the risks                            and benefits, the patient was deemed in                            satisfactory condition to undergo the procedure.                            After obtaining informed consent, the scope was                            passed under direct vision. Throughout the                            procedure,  the patient's blood pressure, pulse, and                            oxygen saturations were monitored continuously. The                            [phone]) scope was introduced through                            the mouth, and used to inject contrast into and                            used to locate the major papilla. The ERCP was                            technically difficult and complex due to                            challenging cannulation because of papillary                            stenosis. The patient tolerated the procedure well. Findings:      The scope was passed through a normal UGI tract. The major papilla was       normal. DIFFICULT CANNULATION. PANCREATIC DUCT CANNULATED       UNINTENTIONALLY BY DR. Ganesh Deeg. SMALL AMOUNT OF DYE INJECTED. BILE DUCT       CANNULATION ACHEIVED BY DR. Laural Golden AFTER WIRE PLACED IN PANCREATIC DUCT.       Image quality was excellent. Contrast extended to the proximal       pancreatic duct. This appeared benign. The in the pancreas was NORMAL. A       wire passed successfully into the right main hepatic duct. A 6 mm       biliary sphincterotomy was made with a monofilament traction (standard)       sphincterotome using ERBE electrocautery(PURE CUT THEN BLENDED(200/20w).       There was no post-sphincterotomy bleeding. The biliary orifice was       stenotic. This appeared benign. The common bile duct contained no       stones. The DISTAL common bile duct  was segmentally dilated BUT THE CHD       AND INTRAHEPATIC DUCTS WERE NORMAL. The largest diameter OF THE CBD was       8 mm.NO FILLING DEFECTS WERE APPRECIATED ON NON-OCCLUSIVE OR OCCLUSIVE       CHOLANGIOGRAM. One 4 Fr by 5 cm plastic stent with a single internal       flap was placed 4 cm into the ventral pancreatic duct. Fluid flowed       through the stent. The stent was in good position. Impression:               - Biliary papillary stenosis, benign treated with                             biliary sphincterotomy.                           - The DISTAL common bile duct was dilated                           - A biliary sphincterotomy was performed.                           - One plastic stent was placed into the ventral                            pancreatic duct. Moderate Sedation:      Per Anesthesia Care Recommendation:           - Return patient to hospital ward for ongoing care.                            ANTICPATE D/C ON MAR 28. HFP/LIPASE IN AM.                           - NPO FOR NEXT 4 HRS. MAY ADVANCE TO FULL LIQUID                            DIET IF NO NAUSEA, VOMTIING, OR ABDOMINAL PAIN                            AFTER 4 HOURS.                           -NEEDS EGD W/ MAC TO REMOVE PD STENT IN 1-2 WEEKS.                           -CHOLECYSTECTOMY after pt returns from trip in May                            2018. Procedure Code(s):        --- Professional ---                           [phone], Esophagogastroduodenoscopy, flexible,  transoral; diagnostic, including collection of                            specimen(s) by brushing or washing, when performed                            (separate procedure) Diagnosis Code(s):        --- Professional ---                           K86.89, Other specified diseases of pancreas                           K83.1, Obstruction of bile duct                           K80.50, Calculus of bile duct without cholangitis                            or cholecystitis without obstruction                           K83.8, Other specified diseases of biliary tract CPT copyright 2016 [employer]. All rights reserved. The codes documented in this report are preliminary and upon coder review may  be revised to meet current compliance requirements. Barney Drain, MD Barney Drain, MD 05/26/2016 4:48:03 PM This report has been signed electronically. Hildred Laser, MD Number of Addenda: 0

## 2016-05-26 NOTE — Progress Notes (Signed)
Pt seen and examined. NO NAUSEA OR VOMITING. Requesting a nicotine patch and would like diet advanced ASAP. EXPLAINED RISK OF PANCREATITIS IF DIET ADVANCED TOO QUICKLY. MAY ADVANCE TO LOW FAT DIET WITH BREAKFAST IF NO ABDOMINAL PAIN OVERNIGHT. ANTICIPATE D/C TO HOME MAR 28 IN AM.

## 2016-05-26 NOTE — Transfer of Care (Signed)
Immediate Anesthesia Transfer of Care Note  Patient: Kathleen DickJaime A Lawyer  Procedure(s) Performed: Procedure(s): ENDOSCOPIC RETROGRADE CHOLANGIOPANCREATOGRAPHY (ERCP) (N/A) SPHINCTEROTOMY (N/A) PANCREATIC STENT PLACEMENT  Patient Location: PACU  Anesthesia Type:General  Level of Consciousness: awake  Airway & Oxygen Therapy: Patient Spontanous Breathing and Patient connected to face mask oxygen  Post-op Assessment: Report given to RN  Post vital signs: Reviewed and stable  Last Vitals:  Vitals:   05/26/16 1245 05/26/16 1300  BP: 114/70 114/69  Pulse:    Resp: 18 14  Temp:      Last Pain:  Vitals:   05/26/16 1230  TempSrc: Oral  PainSc: 7       Patients Stated Pain Goal: 7 (05/26/16 1230)  Complications: No apparent anesthesia complications

## 2016-05-27 ENCOUNTER — Telehealth: Payer: Self-pay | Admitting: Gastroenterology

## 2016-05-27 ENCOUNTER — Other Ambulatory Visit: Payer: Self-pay

## 2016-05-27 DIAGNOSIS — K8050 Calculus of bile duct without cholangitis or cholecystitis without obstruction: Secondary | ICD-10-CM

## 2016-05-27 DIAGNOSIS — K219 Gastro-esophageal reflux disease without esophagitis: Secondary | ICD-10-CM

## 2016-05-27 DIAGNOSIS — Z4689 Encounter for fitting and adjustment of other specified devices: Secondary | ICD-10-CM

## 2016-05-27 DIAGNOSIS — R945 Abnormal results of liver function studies: Secondary | ICD-10-CM

## 2016-05-27 DIAGNOSIS — R7989 Other specified abnormal findings of blood chemistry: Secondary | ICD-10-CM

## 2016-05-27 DIAGNOSIS — K588 Other irritable bowel syndrome: Secondary | ICD-10-CM

## 2016-05-27 LAB — COMPREHENSIVE METABOLIC PANEL WITH GFR
ALT: 77 U/L — ABNORMAL HIGH (ref 14–54)
AST: 31 U/L (ref 15–41)
Albumin: 3.2 g/dL — ABNORMAL LOW (ref 3.5–5.0)
Alkaline Phosphatase: 54 U/L (ref 38–126)
Anion gap: 6 (ref 5–15)
BUN: 7 mg/dL (ref 6–20)
CO2: 25 mmol/L (ref 22–32)
Calcium: 8.2 mg/dL — ABNORMAL LOW (ref 8.9–10.3)
Chloride: 106 mmol/L (ref 101–111)
Creatinine, Ser: 0.85 mg/dL (ref 0.44–1.00)
GFR calc Af Amer: >60 mL/min
GFR calc non Af Amer: >60 mL/min
Glucose, Bld: 87 mg/dL (ref 65–99)
Potassium: 3.8 mmol/L (ref 3.5–5.1)
Sodium: 137 mmol/L (ref 135–145)
Total Bilirubin: 0.7 mg/dL (ref 0.3–1.2)
Total Protein: 5.8 g/dL — ABNORMAL LOW (ref 6.5–8.1)

## 2016-05-27 LAB — CBC
HCT: 36.7 % (ref 36.0–46.0)
Hemoglobin: 12.1 g/dL (ref 12.0–15.0)
MCH: 32.1 pg (ref 26.0–34.0)
MCHC: 33 g/dL (ref 30.0–36.0)
MCV: 97.3 fL (ref 78.0–100.0)
Platelets: 264 K/uL (ref 150–400)
RBC: 3.77 MIL/uL — ABNORMAL LOW (ref 3.87–5.11)
RDW: 12.7 % (ref 11.5–15.5)
WBC: 7.4 K/uL (ref 4.0–10.5)

## 2016-05-27 LAB — LIPASE, BLOOD: Lipase: 18 U/L (ref 11–51)

## 2016-05-27 MED ORDER — OXYCODONE-ACETAMINOPHEN 5-325 MG PO TABS: 1.0000 | ORAL_TABLET | ORAL | 0 refills | Status: DC | PRN

## 2016-05-27 MED ORDER — OXYCODONE-ACETAMINOPHEN 5-325 MG PO TABS: 1.0000 | ORAL_TABLET | ORAL | Status: DC | PRN

## 2016-05-27 NOTE — Telephone Encounter (Signed)
Please make referral to Dr. Lovell SheehanJenkins for cholectystectomy, patient wants to have done in May.   Dx: choledocholithiasis s/p ERCP, cholelithiasis.

## 2016-05-27 NOTE — Progress Notes (Signed)
Subjective:  Feels better. Tolerating diet. Wants to go home.   Objective: Vital signs in last 24 hours: Temp:  [97.7 F (36.5 C)-99.9 F (37.7 C)] 98.6 F (37 C) (03/28 2094) Pulse Rate:  [66-94] 78 (03/28 0642) Resp:  [11-20] 20 (03/28 0642) BP: (100-126)/(46-82) 102/74 (03/28 0642) SpO2:  [88 %-99 %] 96 % (03/28 0642) Last BM Date: 05/24/16 General:   Alert,  Well-developed, well-nourished, pleasant and cooperative in NAD Head:  Normocephalic and atraumatic. Eyes:  Sclera clear, no icterus.   Abdomen:  Soft, mild ruq/epig tenderness, nondistended.  Normal bowel sounds, without guarding, and without rebound.   Extremities:  Without clubbing, deformity or edema. Neurologic:  Alert and  oriented x4;  grossly normal neurologically. Skin:  Intact without significant lesions or rashes. Psych:  Alert and cooperative. Normal mood and affect.  Intake/Output from previous day: 03/27 0701 - 03/28 0700 In: 3527.5 [P.O.:240; I.V.:3287.5] Out: 900 [Urine:900] Intake/Output this shift: No intake/output data recorded.  Lab Results: CBC  Recent Labs  05/25/16 0357 05/26/16 0504 05/27/16 0519  WBC 10.8* 6.5 7.4  HGB 13.9 12.6 12.1  HCT 40.4 38.0 36.7  MCV 93.3 96.0 97.3  PLT 327 291 264   BMET  Recent Labs  05/25/16 0357 05/26/16 0504 05/27/16 0519  NA 132* 137 137  K 3.7 3.8 3.8  CL 99* 106 106  CO2 25 25 25   GLUCOSE 157* 92 87  BUN 11 6 7   CREATININE 0.82 0.71 0.85  CALCIUM 9.0 8.4* 8.2*   LFTs  Recent Labs  05/26/16 0504 05/26/16 1654 05/27/16 0519  BILITOT 0.6 0.7 0.7  BILIDIR 0.1 0.1  --   IBILI 0.5 0.6  --   ALKPHOS 64 61 54  AST 76* 49* 31  ALT 138* 101* 77*  PROT 6.0* 6.0* 5.8*  ALBUMIN 3.4* 3.4* 3.2*    Recent Labs  05/25/16 0357 05/27/16 0519  LIPASE 25 18   PT/INR No results for input(s): LABPROT, INR in the last 72 hours.    Imaging Studies: Ct Abdomen Pelvis W Contrast  Result Date: 05/25/2016 CLINICAL DATA:  Upper abdominal pain.   Right upper quadrant pain. EXAM: CT ABDOMEN AND PELVIS WITH CONTRAST TECHNIQUE: Multidetector CT imaging of the abdomen and pelvis was performed using the standard protocol following bolus administration of intravenous contrast. CONTRAST:  100 cc Isovue-300 IV COMPARISON:  None. FINDINGS: Lower chest: Dependent atelectasis at the lung bases, possible scarring in the left lower lobe. Hepatobiliary: No focal hepatic lesion. Multiple calcified gallstones within the gallbladder. Mild gallbladder wall thickening. Calcified 2 mm stone in the distal most common bile duct. Mild intra and extrahepatic biliary dilatation, the common bile duct measures at the porta hepatis. Borderline hepatic steatosis. Pancreas: No ductal dilatation or inflammation. Spleen: Normal in size without focal abnormality. Adrenals/Urinary Tract: Normal adrenal glands. No hydronephrosis. Symmetric renal enhancement and excretion on delayed phase imaging. Urinary bladder is near completely decompressed. Stomach/Bowel: Stomach is physiologically distended with ingested contrast. The appendix is normal. Colon is decompressed limiting assessment, no evidence of colonic inflammation. No small bowel wall thickening, distention or inflammation. Vascular/Lymphatic: Retroaortic left renal vein, incidentally noted. Trace abdominal aortic atherosclerosis. No adenopathy. Reproductive: 2 cm follicular cyst in the right ovary is likely incidental. Uterus and left ovary are unremarkable. Small amount of simple free fluid in the pelvic cul-de-sac. Other: No free air. No abdominal ascites. Small fat containing umbilical hernia. Musculoskeletal: Bilateral L5 pars interarticularis defects with trace anterolisthesis of L5 on S1. There are  no acute or suspicious osseous abnormalities. IMPRESSION: 1. Cholelithiasis with a choledocholithiasis, a 2 mm stone is in the distal common bile duct. Resultant mild biliary dilatation and gallbladder inflammation. 2. Minimal aortic  atherosclerosis. Electronically Signed   By: Jeb Levering M.D.   On: 05/25/2016 05:05   Dg Retrograde Pyelogram  Result Date: 05/26/2016 CLINICAL DATA:  Common bile duct stones. EXAM: ERCP FLUOROSCOPY TIME:  4 minutes, 54 seconds COMPARISON:  MRCP FINDINGS: 7 spot intraoperative radiographic images of the right upper abdominal quadrant during ERCP are provided for review Initial images demonstrate an ERCP probe overlying the right upper abdominal quadrant. Selective images demonstrate selective cannulation and opacification of the pancreatic duct which appears nondilated. Subsequent images demonstrate selective cannulation opacification of the common bile duct which appears nondilated. There is minimal opacification of the cystic duct and gallbladder. Three discrete filling defects are seen with the gallbladder fundus compatible with gallstones. There are no discrete persistent filling defects are seen with the common bile duct to suggest the presence choledocholithiasis. Subsequent images demonstrate insufflation of a balloon within the mid aspect of the common bile duct with subsequent presumed sweeping and biliary sphincterotomy. IMPRESSION: ERCP with findings of cholelithiasis, biliary sweeping and presumed sphincterotomy. Electronically Signed   By: Sandi Mariscal M.D.   On: 05/26/2016 16:52   Mr 3d Recon At Scanner  Result Date: 05/26/2016 CLINICAL DATA:  Upper abdominal pain and swelling. EXAM: MRI ABDOMEN WITHOUT AND WITH CONTRAST (INCLUDING MRCP) TECHNIQUE: Multiplanar multisequence MR imaging of the abdomen was performed both before and after the administration of intravenous contrast. Heavily T2-weighted images of the biliary and pancreatic ducts were obtained, and three-dimensional MRCP images were rendered by post processing. CONTRAST:  57m MULTIHANCE GADOBENATE DIMEGLUMINE 529 MG/ML IV SOLN COMPARISON:  CT scan 05/25/2016 FINDINGS: Despite efforts by the technologist and patient, motion  artifact is present on today's exam and could not be eliminated. This reduces exam sensitivity and specificity. Lower chest: Linear subsegmental atelectasis anteriorly in the left lower lobe. Trace bilateral pleural effusions. Hepatobiliary: Small gallstones are present dependently in the gallbladder. Gallbladder wall thickness upper normal. No significant abnormal focal parenchymal enhancement in the liver. There is some faint accentuated enhancement along the margins of the distal common bile duct for example image 50/24, possibly due to low-grade local inflammation. 3 mm filling defect in the common bile duct on image 23/4 compatible with choledocholithiasis. The more distal stone in the ampullary region is not well seen, although sensitivity is reduced by the considerable degree of motion artifact. Pancreas:  Unremarkable.  No dorsal pancreatic duct dilatation. Spleen:  Unremarkable Adrenals/Urinary Tract:  Unremarkable Stomach/Bowel: Unremarkable Vascular/Lymphatic: Retroaortic left renal vein. Otherwise unremarkable. Other:  No supplemental non-categorized findings. Musculoskeletal: Unremarkable IMPRESSION: 1. MRI confirms several small gallstones in the gallbladder with a single stone currently visible in the common bile duct about 2.4 cm proximal to the ampulla. This may represent proximal migration of the stone previously seen in the ampulla. Currently no biliary dilatation. Gallbladder wall thickness upper normal. No focal liver lesion. 2. Trace bilateral pleural effusions with subsegmental atelectasis in the left lower lobe. Electronically Signed   By: WVan ClinesM.D.   On: 05/26/2016 11:07   Mr Abdomen Mrcp WMoise BoringContast  Result Date: 05/26/2016 CLINICAL DATA:  Upper abdominal pain and swelling. EXAM: MRI ABDOMEN WITHOUT AND WITH CONTRAST (INCLUDING MRCP) TECHNIQUE: Multiplanar multisequence MR imaging of the abdomen was performed both before and after the administration of intravenous  contrast. Heavily T2-weighted  images of the biliary and pancreatic ducts were obtained, and three-dimensional MRCP images were rendered by post processing. CONTRAST:  27m MULTIHANCE GADOBENATE DIMEGLUMINE 529 MG/ML IV SOLN COMPARISON:  CT scan 05/25/2016 FINDINGS: Despite efforts by the technologist and patient, motion artifact is present on today's exam and could not be eliminated. This reduces exam sensitivity and specificity. Lower chest: Linear subsegmental atelectasis anteriorly in the left lower lobe. Trace bilateral pleural effusions. Hepatobiliary: Small gallstones are present dependently in the gallbladder. Gallbladder wall thickness upper normal. No significant abnormal focal parenchymal enhancement in the liver. There is some faint accentuated enhancement along the margins of the distal common bile duct for example image 50/24, possibly due to low-grade local inflammation. 3 mm filling defect in the common bile duct on image 23/4 compatible with choledocholithiasis. The more distal stone in the ampullary region is not well seen, although sensitivity is reduced by the considerable degree of motion artifact. Pancreas:  Unremarkable.  No dorsal pancreatic duct dilatation. Spleen:  Unremarkable Adrenals/Urinary Tract:  Unremarkable Stomach/Bowel: Unremarkable Vascular/Lymphatic: Retroaortic left renal vein. Otherwise unremarkable. Other:  No supplemental non-categorized findings. Musculoskeletal: Unremarkable IMPRESSION: 1. MRI confirms several small gallstones in the gallbladder with a single stone currently visible in the common bile duct about 2.4 cm proximal to the ampulla. This may represent proximal migration of the stone previously seen in the ampulla. Currently no biliary dilatation. Gallbladder wall thickness upper normal. No focal liver lesion. 2. Trace bilateral pleural effusions with subsegmental atelectasis in the left lower lobe. Electronically Signed   By: WVan ClinesM.D.   On:  05/26/2016 11:07  [2 weeks]   Assessment:  41year old female admitted with choledocholithiasis, s/p ERCP yesterday, noted to have biliary papillary stenosis but no CBD stones noted. There was distal common bile duct dilation. Biliary sphincterotomy performed, pancreatic duct stent placed.   LFTs continue to improve. Lipase normal this morning. Clinically improved and stable for discharge.   Plan: 1. EGD with MAC to remove PD stent in 1-2 weeks. 2. Cholecystectomy after patient returns from trip in May 2018. 3. Low fat diet. 4. Consider LFTs day of EGD.  5. Stable for discharge from gi standpoint.  6. Would consider return to work on Monday. Pain management at time of discharge per attending.   LLaureen Ochs LBernarda CaffeyRPremier [facility] [phone]/28/20188:56 AM     LOS: 1 day

## 2016-05-27 NOTE — Progress Notes (Signed)
Discharge instructions gone over with patient. Verbalized understanding. IV removed. Patient tolerated procedure well. Printed prescription and note for work were both given to patient.

## 2016-05-27 NOTE — Telephone Encounter (Signed)
Referral has been made.

## 2016-05-27 NOTE — Discharge Summary (Signed)
Physician Discharge Summary  Kathleen Stewart ZOX:[id number] DOB: 02/22/1976 DOA: 05/25/2016  PCP: Dwana Melena, MD  Admit date: 05/25/2016 Discharge date: 05/27/2016  Admitted From: Home Disposition:  Home   Recommendations for Outpatient Follow-up:  1. Follow up with PCP in 1-2 weeks 2. Please obtain BMP/CBC in one week    Discharge Condition: Stable CODE STATUS:FULL Diet recommendation: Soft  Brief/Interim Summary: 41 year old female with a history of depression, anxiety, irritable bowel syndrome presented with 1 day history of right upper quadrant pain and epigastric pain with associated nausea and vomiting. CT of the abdomen and pelvis at the time of admission showed a cholelithiasis with choledocholithiasis with a 2 mm stone in the distal common bile duct. MRCP was subsequently performed and showed cholelithiasis with a single stone in the common bile duct proximal to the ampulla. Gastroenterology was consulted. On 05/26/2016, ERCP was performed and revealed biliary papillary stenosis. Sphincterotomy was performed and 1 plastic stent was placed in the ventral pancreatic duct. The patient's diet was advanced which she tolerated.  The patient was cleared by GI for discharge. She will return in May 2018 after her vacation to have cholecystectomy.  Discharge Diagnoses:  Abdominal pain and cholelithiasis -05/26/2016 ERCP--noted to have biliary papillary stenosis but no CBD stones noted -1 plastic stent was placed in the ventral pancreatic duct -The patient will return in May 2018 for her cholecystectomy -diet advanced which pt tolerated  Tobacco abuse -Tobacco cessation discussed  Anxiety/depression -Continue home dose Lexapro and alprazolam  Discharge Instructions  Discharge Instructions    Diet - low sodium heart healthy    Complete by:  As directed    Increase activity slowly    Complete by:  As directed      Allergies as of 05/27/2016      Reactions   Adhesive [tape]  Dermatitis   Redness at adhesive sites      Medication List    STOP taking these medications   HYDROcodone-acetaminophen 5-325 MG tablet Commonly known as:  NORCO/VICODIN   ibuprofen 200 MG tablet Commonly known as:  ADVIL,MOTRIN   ibuprofen 800 MG tablet Commonly known as:  ADVIL,MOTRIN     TAKE these medications   adapalene 0.1 % gel Commonly known as:  DIFFERIN Apply 1 application topically at bedtime.   ALPRAZolam 1 MG tablet Commonly known as:  XANAX Take 1 mg by mouth at bedtime.   BEANO Tabs Take 2 tablets by mouth as needed (gas relief).   escitalopram 20 MG tablet Commonly known as:  LEXAPRO Take 10 mg by mouth daily.   HAIR/SKIN/NAILS PO Take 1 tablet by mouth daily.   omeprazole 40 MG capsule Commonly known as:  PRILOSEC Take 40 mg by mouth 2 (two) times daily.   ondansetron 4 MG tablet Commonly known as:  ZOFRAN Take 4 mg by mouth every 8 (eight) hours as needed for nausea or vomiting.   oxyCODONE-acetaminophen 5-325 MG tablet Commonly known as:  PERCOCET/ROXICET Take 1 tablet by mouth every 4 (four) hours as needed for moderate pain.   TRUBIOTICS PO Take 1 tablet by mouth daily.      Follow-up Information    Dwana Melena, MD In 1 week.   Specialty:  Internal Medicine Contact information: [address] [place] [address] [phone]          Allergies  Allergen Reactions  . Adhesive [Tape] Dermatitis    Redness at adhesive sites    Consultations:  gastroenterology   Procedures/Studies: Ct Abdomen  Pelvis W Contrast  Result Date: 05/25/2016 CLINICAL DATA:  Upper abdominal pain.  Right upper quadrant pain. EXAM: CT ABDOMEN AND PELVIS WITH CONTRAST TECHNIQUE: Multidetector CT imaging of the abdomen and pelvis was performed using the standard protocol following bolus administration of intravenous contrast. CONTRAST:  100 cc Isovue-300 IV COMPARISON:  None. FINDINGS: Lower chest: Dependent atelectasis at the lung bases,  possible scarring in the left lower lobe. Hepatobiliary: No focal hepatic lesion. Multiple calcified gallstones within the gallbladder. Mild gallbladder wall thickening. Calcified 2 mm stone in the distal most common bile duct. Mild intra and extrahepatic biliary dilatation, the common bile duct measures at the porta hepatis. Borderline hepatic steatosis. Pancreas: No ductal dilatation or inflammation. Spleen: Normal in size without focal abnormality. Adrenals/Urinary Tract: Normal adrenal glands. No hydronephrosis. Symmetric renal enhancement and excretion on delayed phase imaging. Urinary bladder is near completely decompressed. Stomach/Bowel: Stomach is physiologically distended with ingested contrast. The appendix is normal. Colon is decompressed limiting assessment, no evidence of colonic inflammation. No small bowel wall thickening, distention or inflammation. Vascular/Lymphatic: Retroaortic left renal vein, incidentally noted. Trace abdominal aortic atherosclerosis. No adenopathy. Reproductive: 2 cm follicular cyst in the right ovary is likely incidental. Uterus and left ovary are unremarkable. Small amount of simple free fluid in the pelvic cul-de-sac. Other: No free air. No abdominal ascites. Small fat containing umbilical hernia. Musculoskeletal: Bilateral L5 pars interarticularis defects with trace anterolisthesis of L5 on S1. There are no acute or suspicious osseous abnormalities. IMPRESSION: 1. Cholelithiasis with a choledocholithiasis, a 2 mm stone is in the distal common bile duct. Resultant mild biliary dilatation and gallbladder inflammation. 2. Minimal aortic atherosclerosis. Electronically Signed   By: Rubye Oaks M.D.   On: 05/25/2016 05:05   Dg Retrograde Pyelogram  Result Date: 05/26/2016 CLINICAL DATA:  Common bile duct stones. EXAM: ERCP FLUOROSCOPY TIME:  4 minutes, 54 seconds COMPARISON:  MRCP FINDINGS: 7 spot intraoperative radiographic images of the right upper abdominal quadrant  during ERCP are provided for review Initial images demonstrate an ERCP probe overlying the right upper abdominal quadrant. Selective images demonstrate selective cannulation and opacification of the pancreatic duct which appears nondilated. Subsequent images demonstrate selective cannulation opacification of the common bile duct which appears nondilated. There is minimal opacification of the cystic duct and gallbladder. Three discrete filling defects are seen with the gallbladder fundus compatible with gallstones. There are no discrete persistent filling defects are seen with the common bile duct to suggest the presence choledocholithiasis. Subsequent images demonstrate insufflation of a balloon within the mid aspect of the common bile duct with subsequent presumed sweeping and biliary sphincterotomy. IMPRESSION: ERCP with findings of cholelithiasis, biliary sweeping and presumed sphincterotomy. Electronically Signed   By: Simonne Come M.D.   On: 05/26/2016 16:52   Mr 3d Recon At Scanner  Result Date: 05/26/2016 CLINICAL DATA:  Upper abdominal pain and swelling. EXAM: MRI ABDOMEN WITHOUT AND WITH CONTRAST (INCLUDING MRCP) TECHNIQUE: Multiplanar multisequence MR imaging of the abdomen was performed both before and after the administration of intravenous contrast. Heavily T2-weighted images of the biliary and pancreatic ducts were obtained, and three-dimensional MRCP images were rendered by post processing. CONTRAST:  18mL MULTIHANCE GADOBENATE DIMEGLUMINE 529 MG/ML IV SOLN COMPARISON:  CT scan 05/25/2016 FINDINGS: Despite efforts by the technologist and patient, motion artifact is present on today's exam and could not be eliminated. This reduces exam sensitivity and specificity. Lower chest: Linear subsegmental atelectasis anteriorly in the left lower lobe. Trace bilateral pleural effusions. Hepatobiliary: Small gallstones  are present dependently in the gallbladder. Gallbladder wall thickness upper normal. No  significant abnormal focal parenchymal enhancement in the liver. There is some faint accentuated enhancement along the margins of the distal common bile duct for example image 50/24, possibly due to low-grade local inflammation. 3 mm filling defect in the common bile duct on image 23/4 compatible with choledocholithiasis. The more distal stone in the ampullary region is not well seen, although sensitivity is reduced by the considerable degree of motion artifact. Pancreas:  Unremarkable.  No dorsal pancreatic duct dilatation. Spleen:  Unremarkable Adrenals/Urinary Tract:  Unremarkable Stomach/Bowel: Unremarkable Vascular/Lymphatic: Retroaortic left renal vein. Otherwise unremarkable. Other:  No supplemental non-categorized findings. Musculoskeletal: Unremarkable IMPRESSION: 1. MRI confirms several small gallstones in the gallbladder with a single stone currently visible in the common bile duct about 2.4 cm proximal to the ampulla. This may represent proximal migration of the stone previously seen in the ampulla. Currently no biliary dilatation. Gallbladder wall thickness upper normal. No focal liver lesion. 2. Trace bilateral pleural effusions with subsegmental atelectasis in the left lower lobe. Electronically Signed   By: Gaylyn Rong M.D.   On: 05/26/2016 11:07   Mr Abdomen Mrcp Vivien Rossetti Contast  Result Date: 05/26/2016 CLINICAL DATA:  Upper abdominal pain and swelling. EXAM: MRI ABDOMEN WITHOUT AND WITH CONTRAST (INCLUDING MRCP) TECHNIQUE: Multiplanar multisequence MR imaging of the abdomen was performed both before and after the administration of intravenous contrast. Heavily T2-weighted images of the biliary and pancreatic ducts were obtained, and three-dimensional MRCP images were rendered by post processing. CONTRAST:  18mL MULTIHANCE GADOBENATE DIMEGLUMINE 529 MG/ML IV SOLN COMPARISON:  CT scan 05/25/2016 FINDINGS: Despite efforts by the technologist and patient, motion artifact is present on today's  exam and could not be eliminated. This reduces exam sensitivity and specificity. Lower chest: Linear subsegmental atelectasis anteriorly in the left lower lobe. Trace bilateral pleural effusions. Hepatobiliary: Small gallstones are present dependently in the gallbladder. Gallbladder wall thickness upper normal. No significant abnormal focal parenchymal enhancement in the liver. There is some faint accentuated enhancement along the margins of the distal common bile duct for example image 50/24, possibly due to low-grade local inflammation. 3 mm filling defect in the common bile duct on image 23/4 compatible with choledocholithiasis. The more distal stone in the ampullary region is not well seen, although sensitivity is reduced by the considerable degree of motion artifact. Pancreas:  Unremarkable.  No dorsal pancreatic duct dilatation. Spleen:  Unremarkable Adrenals/Urinary Tract:  Unremarkable Stomach/Bowel: Unremarkable Vascular/Lymphatic: Retroaortic left renal vein. Otherwise unremarkable. Other:  No supplemental non-categorized findings. Musculoskeletal: Unremarkable IMPRESSION: 1. MRI confirms several small gallstones in the gallbladder with a single stone currently visible in the common bile duct about 2.4 cm proximal to the ampulla. This may represent proximal migration of the stone previously seen in the ampulla. Currently no biliary dilatation. Gallbladder wall thickness upper normal. No focal liver lesion. 2. Trace bilateral pleural effusions with subsegmental atelectasis in the left lower lobe. Electronically Signed   By: Gaylyn Rong M.D.   On: 05/26/2016 11:07         Discharge Exam: Vitals:   05/26/16 2232 05/27/16 0642  BP: (!) 100/46 102/74  Pulse: 76 78  Resp: 20 20  Temp: 99.9 F (37.7 C) 98.6 F (37 C)   Vitals:   05/26/16 1650 05/26/16 2010 05/26/16 2232 05/27/16 0642  BP: (!) 106/54  (!) 100/46 102/74  Pulse: 79  76 78  Resp: 20  20 20   Temp: 99.9 F (  37.7 C)  99.9  F (37.7 C) 98.6 F (37 C)  TempSrc: Oral  Oral Oral  SpO2: 99% 92% 95% 96%  Weight:      Height:        General: Pt is alert, awake, not in acute distress Cardiovascular: RRR, S1/S2 +, no rubs, no gallops Respiratory: CTA bilaterally, no wheezing, no rhonchi Abdominal: Soft, NT, ND, bowel sounds + Extremities: no edema, no cyanosis   The results of significant diagnostics from this hospitalization (including imaging, microbiology, ancillary and laboratory) are listed below for reference.    Significant Diagnostic Studies: Ct Abdomen Pelvis W Contrast  Result Date: 05/25/2016 CLINICAL DATA:  Upper abdominal pain.  Right upper quadrant pain. EXAM: CT ABDOMEN AND PELVIS WITH CONTRAST TECHNIQUE: Multidetector CT imaging of the abdomen and pelvis was performed using the standard protocol following bolus administration of intravenous contrast. CONTRAST:  100 cc Isovue-300 IV COMPARISON:  None. FINDINGS: Lower chest: Dependent atelectasis at the lung bases, possible scarring in the left lower lobe. Hepatobiliary: No focal hepatic lesion. Multiple calcified gallstones within the gallbladder. Mild gallbladder wall thickening. Calcified 2 mm stone in the distal most common bile duct. Mild intra and extrahepatic biliary dilatation, the common bile duct measures at the porta hepatis. Borderline hepatic steatosis. Pancreas: No ductal dilatation or inflammation. Spleen: Normal in size without focal abnormality. Adrenals/Urinary Tract: Normal adrenal glands. No hydronephrosis. Symmetric renal enhancement and excretion on delayed phase imaging. Urinary bladder is near completely decompressed. Stomach/Bowel: Stomach is physiologically distended with ingested contrast. The appendix is normal. Colon is decompressed limiting assessment, no evidence of colonic inflammation. No small bowel wall thickening, distention or inflammation. Vascular/Lymphatic: Retroaortic left renal vein, incidentally noted. Trace  abdominal aortic atherosclerosis. No adenopathy. Reproductive: 2 cm follicular cyst in the right ovary is likely incidental. Uterus and left ovary are unremarkable. Small amount of simple free fluid in the pelvic cul-de-sac. Other: No free air. No abdominal ascites. Small fat containing umbilical hernia. Musculoskeletal: Bilateral L5 pars interarticularis defects with trace anterolisthesis of L5 on S1. There are no acute or suspicious osseous abnormalities. IMPRESSION: 1. Cholelithiasis with a choledocholithiasis, a 2 mm stone is in the distal common bile duct. Resultant mild biliary dilatation and gallbladder inflammation. 2. Minimal aortic atherosclerosis. Electronically Signed   By: Rubye Oaks M.D.   On: 05/25/2016 05:05   Dg Retrograde Pyelogram  Result Date: 05/26/2016 CLINICAL DATA:  Common bile duct stones. EXAM: ERCP FLUOROSCOPY TIME:  4 minutes, 54 seconds COMPARISON:  MRCP FINDINGS: 7 spot intraoperative radiographic images of the right upper abdominal quadrant during ERCP are provided for review Initial images demonstrate an ERCP probe overlying the right upper abdominal quadrant. Selective images demonstrate selective cannulation and opacification of the pancreatic duct which appears nondilated. Subsequent images demonstrate selective cannulation opacification of the common bile duct which appears nondilated. There is minimal opacification of the cystic duct and gallbladder. Three discrete filling defects are seen with the gallbladder fundus compatible with gallstones. There are no discrete persistent filling defects are seen with the common bile duct to suggest the presence choledocholithiasis. Subsequent images demonstrate insufflation of a balloon within the mid aspect of the common bile duct with subsequent presumed sweeping and biliary sphincterotomy. IMPRESSION: ERCP with findings of cholelithiasis, biliary sweeping and presumed sphincterotomy. Electronically Signed   By: Simonne Come M.D.    On: 05/26/2016 16:52   Mr 3d Recon At Scanner  Result Date: 05/26/2016 CLINICAL DATA:  Upper abdominal pain and swelling. EXAM: MRI ABDOMEN  WITHOUT AND WITH CONTRAST (INCLUDING MRCP) TECHNIQUE: Multiplanar multisequence MR imaging of the abdomen was performed both before and after the administration of intravenous contrast. Heavily T2-weighted images of the biliary and pancreatic ducts were obtained, and three-dimensional MRCP images were rendered by post processing. CONTRAST:  18mL MULTIHANCE GADOBENATE DIMEGLUMINE 529 MG/ML IV SOLN COMPARISON:  CT scan 05/25/2016 FINDINGS: Despite efforts by the technologist and patient, motion artifact is present on today's exam and could not be eliminated. This reduces exam sensitivity and specificity. Lower chest: Linear subsegmental atelectasis anteriorly in the left lower lobe. Trace bilateral pleural effusions. Hepatobiliary: Small gallstones are present dependently in the gallbladder. Gallbladder wall thickness upper normal. No significant abnormal focal parenchymal enhancement in the liver. There is some faint accentuated enhancement along the margins of the distal common bile duct for example image 50/24, possibly due to low-grade local inflammation. 3 mm filling defect in the common bile duct on image 23/4 compatible with choledocholithiasis. The more distal stone in the ampullary region is not well seen, although sensitivity is reduced by the considerable degree of motion artifact. Pancreas:  Unremarkable.  No dorsal pancreatic duct dilatation. Spleen:  Unremarkable Adrenals/Urinary Tract:  Unremarkable Stomach/Bowel: Unremarkable Vascular/Lymphatic: Retroaortic left renal vein. Otherwise unremarkable. Other:  No supplemental non-categorized findings. Musculoskeletal: Unremarkable IMPRESSION: 1. MRI confirms several small gallstones in the gallbladder with a single stone currently visible in the common bile duct about 2.4 cm proximal to the ampulla. This may  represent proximal migration of the stone previously seen in the ampulla. Currently no biliary dilatation. Gallbladder wall thickness upper normal. No focal liver lesion. 2. Trace bilateral pleural effusions with subsegmental atelectasis in the left lower lobe. Electronically Signed   By: Gaylyn RongWalter  Liebkemann M.D.   On: 05/26/2016 11:07   Mr Abdomen Mrcp Vivien RossettiW Wo Contast  Result Date: 05/26/2016 CLINICAL DATA:  Upper abdominal pain and swelling. EXAM: MRI ABDOMEN WITHOUT AND WITH CONTRAST (INCLUDING MRCP) TECHNIQUE: Multiplanar multisequence MR imaging of the abdomen was performed both before and after the administration of intravenous contrast. Heavily T2-weighted images of the biliary and pancreatic ducts were obtained, and three-dimensional MRCP images were rendered by post processing. CONTRAST:  18mL MULTIHANCE GADOBENATE DIMEGLUMINE 529 MG/ML IV SOLN COMPARISON:  CT scan 05/25/2016 FINDINGS: Despite efforts by the technologist and patient, motion artifact is present on today's exam and could not be eliminated. This reduces exam sensitivity and specificity. Lower chest: Linear subsegmental atelectasis anteriorly in the left lower lobe. Trace bilateral pleural effusions. Hepatobiliary: Small gallstones are present dependently in the gallbladder. Gallbladder wall thickness upper normal. No significant abnormal focal parenchymal enhancement in the liver. There is some faint accentuated enhancement along the margins of the distal common bile duct for example image 50/24, possibly due to low-grade local inflammation. 3 mm filling defect in the common bile duct on image 23/4 compatible with choledocholithiasis. The more distal stone in the ampullary region is not well seen, although sensitivity is reduced by the considerable degree of motion artifact. Pancreas:  Unremarkable.  No dorsal pancreatic duct dilatation. Spleen:  Unremarkable Adrenals/Urinary Tract:  Unremarkable Stomach/Bowel: Unremarkable Vascular/Lymphatic:  Retroaortic left renal vein. Otherwise unremarkable. Other:  No supplemental non-categorized findings. Musculoskeletal: Unremarkable IMPRESSION: 1. MRI confirms several small gallstones in the gallbladder with a single stone currently visible in the common bile duct about 2.4 cm proximal to the ampulla. This may represent proximal migration of the stone previously seen in the ampulla. Currently no biliary dilatation. Gallbladder wall thickness upper normal. No focal liver lesion.  2. Trace bilateral pleural effusions with subsegmental atelectasis in the left lower lobe. Electronically Signed   By: Gaylyn Rong M.D.   On: 05/26/2016 11:07     Microbiology: Recent Results (from the past 240 hour(s))  Surgical pcr screen     Status: None   Collection Time: 05/25/16  8:03 PM  Result Value Ref Range Status   MRSA, PCR NEGATIVE NEGATIVE Final   Staphylococcus aureus NEGATIVE NEGATIVE Final    Comment:        The Xpert SA Assay (FDA approved for NASAL specimens in patients over 77 years of age), is one component of a comprehensive surveillance program.  Test performance has been validated by [facility] for patients greater than or equal to 30 year old. It is not intended to diagnose infection nor to guide or monitor treatment.      Labs: Basic Metabolic Panel:  Recent Labs Lab 05/25/16 0357 05/26/16 0504 05/27/16 0519  NA 132* 137 137  K 3.7 3.8 3.8  CL 99* 106 106  CO2 25 25 25   GLUCOSE 157* 92 87  BUN 11 6 7   CREATININE 0.82 0.71 0.85  CALCIUM 9.0 8.4* 8.2*   Liver Function Tests:  Recent Labs Lab 05/25/16 0357 05/26/16 0504 05/26/16 1654 05/27/16 0519  AST 294* 76* 49* 31  ALT 206* 138* 101* 77*  ALKPHOS 80 64 61 54  BILITOT 1.3* 0.6 0.7 0.7  PROT 7.1 6.0* 6.0* 5.8*  ALBUMIN 4.1 3.4* 3.4* 3.2*    Recent Labs Lab 05/25/16 0357 05/27/16 0519  LIPASE 25 18   No results for input(s): AMMONIA in the last 168 hours. CBC:  Recent Labs Lab 05/25/16 0357  05/26/16 0504 05/27/16 0519  WBC 10.8* 6.5 7.4  HGB 13.9 12.6 12.1  HCT 40.4 38.0 36.7  MCV 93.3 96.0 97.3  PLT 327 291 264   Cardiac Enzymes: No results for input(s): CKTOTAL, CKMB, CKMBINDEX, TROPONINI in the last 168 hours. BNP: Invalid input(s): POCBNP CBG: No results for input(s): GLUCAP in the last 168 hours.  Time coordinating discharge:  Greater than 30 minutes  Signed:  Itzia Cunliffe, DO [facility] Pager: [phone] 05/27/2016, 10:12 AM

## 2016-05-28 LAB — HEPATITIS C ANTIBODY

## 2016-05-28 LAB — HEPATITIS B SURFACE ANTIGEN: Hepatitis B Surface Ag: NEGATIVE

## 2016-06-01 ENCOUNTER — Telehealth: Payer: Self-pay

## 2016-06-01 ENCOUNTER — Encounter (HOSPITAL_COMMUNITY)
Admission: RE | Admit: 2016-06-01 | Discharge: 2016-06-01 | Disposition: A | Discharge status: Home / Self Care | Payer: 59 | Source: Ambulatory Visit | Attending: Gastroenterology | Admitting: Gastroenterology

## 2016-06-01 ENCOUNTER — Encounter (HOSPITAL_COMMUNITY): Payer: Self-pay

## 2016-06-01 NOTE — Telephone Encounter (Signed)
[facility] at [facility] called office. Pt needed PA for EGD/stent removal that is scheduled for tomorrow. PA info submitted via [facility] website. Approved. PA# [id number]. LMOVM and informed [facility] of PA.

## 2016-06-02 ENCOUNTER — Encounter (HOSPITAL_COMMUNITY): Payer: Self-pay | Admitting: *Deleted

## 2016-06-02 ENCOUNTER — Inpatient Hospital Stay (HOSPITAL_COMMUNITY)
Admission: EM | Admit: 2016-06-02 | Discharge: 2016-06-04 | DRG: 419 | Disposition: A | Discharge status: Home / Self Care | Payer: 59 | Attending: Internal Medicine | Admitting: Internal Medicine

## 2016-06-02 ENCOUNTER — Encounter (HOSPITAL_COMMUNITY)
Admission: EM | Disposition: A | Discharge status: Home / Self Care | Payer: Self-pay | Source: Home / Self Care | Attending: Internal Medicine

## 2016-06-02 ENCOUNTER — Inpatient Hospital Stay (HOSPITAL_COMMUNITY): Payer: 59

## 2016-06-02 ENCOUNTER — Ambulatory Visit (HOSPITAL_COMMUNITY): Admission: RE | Admit: 2016-06-02 | Payer: 59 | Source: Ambulatory Visit | Admitting: Gastroenterology

## 2016-06-02 ENCOUNTER — Emergency Department (HOSPITAL_COMMUNITY): Payer: 59

## 2016-06-02 ENCOUNTER — Inpatient Hospital Stay (HOSPITAL_COMMUNITY): Payer: 59 | Admitting: Anesthesiology

## 2016-06-02 DIAGNOSIS — Z91048 Other nonmedicinal substance allergy status: Secondary | ICD-10-CM | POA: Diagnosis not present

## 2016-06-02 DIAGNOSIS — K810 Acute cholecystitis: Secondary | ICD-10-CM

## 2016-06-02 DIAGNOSIS — K219 Gastro-esophageal reflux disease without esophagitis: Secondary | ICD-10-CM | POA: Diagnosis present

## 2016-06-02 DIAGNOSIS — R945 Abnormal results of liver function studies: Secondary | ICD-10-CM

## 2016-06-02 DIAGNOSIS — K831 Obstruction of bile duct: Secondary | ICD-10-CM

## 2016-06-02 DIAGNOSIS — F329 Major depressive disorder, single episode, unspecified: Secondary | ICD-10-CM | POA: Diagnosis present

## 2016-06-02 DIAGNOSIS — K589 Irritable bowel syndrome without diarrhea: Secondary | ICD-10-CM | POA: Diagnosis not present

## 2016-06-02 DIAGNOSIS — F17210 Nicotine dependence, cigarettes, uncomplicated: Secondary | ICD-10-CM | POA: Diagnosis present

## 2016-06-02 DIAGNOSIS — F411 Generalized anxiety disorder: Secondary | ICD-10-CM | POA: Diagnosis present

## 2016-06-02 DIAGNOSIS — K8050 Calculus of bile duct without cholangitis or cholecystitis without obstruction: Secondary | ICD-10-CM | POA: Diagnosis present

## 2016-06-02 DIAGNOSIS — R7989 Other specified abnormal findings of blood chemistry: Secondary | ICD-10-CM | POA: Diagnosis present

## 2016-06-02 DIAGNOSIS — G8929 Other chronic pain: Secondary | ICD-10-CM | POA: Diagnosis present

## 2016-06-02 DIAGNOSIS — K828 Other specified diseases of gallbladder: Secondary | ICD-10-CM | POA: Diagnosis present

## 2016-06-02 DIAGNOSIS — K8066 Calculus of gallbladder and bile duct with acute and chronic cholecystitis without obstruction: Principal | ICD-10-CM | POA: Diagnosis present

## 2016-06-02 DIAGNOSIS — K8010 Calculus of gallbladder with chronic cholecystitis without obstruction: Secondary | ICD-10-CM

## 2016-06-02 DIAGNOSIS — R197 Diarrhea, unspecified: Secondary | ICD-10-CM | POA: Diagnosis present

## 2016-06-02 DIAGNOSIS — Z79899 Other long term (current) drug therapy: Secondary | ICD-10-CM

## 2016-06-02 DIAGNOSIS — G4700 Insomnia, unspecified: Secondary | ICD-10-CM | POA: Diagnosis present

## 2016-06-02 DIAGNOSIS — K660 Peritoneal adhesions (postprocedural) (postinfection): Secondary | ICD-10-CM | POA: Diagnosis present

## 2016-06-02 DIAGNOSIS — Z4689 Encounter for fitting and adjustment of other specified devices: Secondary | ICD-10-CM

## 2016-06-02 DIAGNOSIS — M549 Dorsalgia, unspecified: Secondary | ICD-10-CM | POA: Diagnosis present

## 2016-06-02 DIAGNOSIS — Z720 Tobacco use: Secondary | ICD-10-CM

## 2016-06-02 DIAGNOSIS — K8067 Calculus of gallbladder and bile duct with acute and chronic cholecystitis with obstruction: Secondary | ICD-10-CM

## 2016-06-02 HISTORY — PX: SPHINCTEROTOMY: SHX5544

## 2016-06-02 HISTORY — PX: ERCP: SHX5425

## 2016-06-02 HISTORY — DX: Other specified postprocedural states: Z98.890

## 2016-06-02 LAB — CBC WITH DIFFERENTIAL/PLATELET
BASOS ABS: 0 10*3/uL (ref 0.0–0.1)
Basophils Relative: 0 %
Eosinophils Absolute: 0 10*3/uL (ref 0.0–0.7)
Eosinophils Relative: 0 %
HEMATOCRIT: 39.5 % (ref 36.0–46.0)
HEMOGLOBIN: 13.4 g/dL (ref 12.0–15.0)
LYMPHS ABS: 1.2 10*3/uL (ref 0.7–4.0)
LYMPHS PCT: 10 %
MCH: 31.7 pg (ref 26.0–34.0)
MCHC: 33.9 g/dL (ref 30.0–36.0)
MCV: 93.4 fL (ref 78.0–100.0)
Monocytes Absolute: 0.8 10*3/uL (ref 0.1–1.0)
Monocytes Relative: 7 %
NEUTROS ABS: 9.6 10*3/uL — AB (ref 1.7–7.7)
NEUTROS PCT: 83 %
PLATELETS: 355 10*3/uL (ref 150–400)
RBC: 4.23 MIL/uL (ref 3.87–5.11)
RDW: 12.5 % (ref 11.5–15.5)
WBC: 11.6 10*3/uL — AB (ref 4.0–10.5)

## 2016-06-02 LAB — COMPREHENSIVE METABOLIC PANEL
ALK PHOS: 101 U/L (ref 38–126)
ALT: 97 U/L — AB (ref 14–54)
AST: 164 U/L — AB (ref 15–41)
Albumin: 4.1 g/dL (ref 3.5–5.0)
Anion gap: 8 (ref 5–15)
BUN: 10 mg/dL (ref 6–20)
CALCIUM: 9.1 mg/dL (ref 8.9–10.3)
CHLORIDE: 101 mmol/L (ref 101–111)
CO2: 25 mmol/L (ref 22–32)
CREATININE: 0.93 mg/dL (ref 0.44–1.00)
Glucose, Bld: 130 mg/dL — ABNORMAL HIGH (ref 65–99)
Potassium: 3.8 mmol/L (ref 3.5–5.1)
Sodium: 134 mmol/L — ABNORMAL LOW (ref 135–145)
Total Bilirubin: 1 mg/dL (ref 0.3–1.2)
Total Protein: 7 g/dL (ref 6.5–8.1)

## 2016-06-02 LAB — LIPASE, BLOOD: LIPASE: 21 U/L (ref 11–51)

## 2016-06-02 LAB — POC URINE PREG, ED: Preg Test, Ur: NEGATIVE

## 2016-06-02 SURGERY — ERCP, WITH INTERVENTION IF INDICATED
Anesthesia: General

## 2016-06-02 SURGERY — ESOPHAGOGASTRODUODENOSCOPY (EGD) WITH PROPOFOL
Anesthesia: Monitor Anesthesia Care

## 2016-06-02 MED ORDER — CHLORHEXIDINE GLUCONATE CLOTH 2 % EX PADS
6.0000 | MEDICATED_PAD | Freq: Once | CUTANEOUS | Status: AC
  Administered 2016-06-03: 6 via TOPICAL

## 2016-06-02 MED ORDER — NICOTINE 21 MG/24HR TD PT24
21.0000 mg | MEDICATED_PATCH | Freq: Every day | TRANSDERMAL | Status: DC
  Administered 2016-06-03 – 2016-06-04 (×2): 21 mg via TRANSDERMAL
  Filled 2016-06-02 (×2): qty 1

## 2016-06-02 MED ORDER — ACETAMINOPHEN 650 MG RE SUPP: 650.0000 mg | Freq: Four times a day (QID) | RECTAL | Status: DC | PRN

## 2016-06-02 MED ORDER — HYDROMORPHONE HCL 1 MG/ML IJ SOLN
0.2500 mg | INTRAMUSCULAR | Status: DC | PRN
  Administered 2016-06-02: 0.5 mg via INTRAVENOUS
  Filled 2016-06-02: qty 1

## 2016-06-02 MED ORDER — MORPHINE SULFATE (PF) 4 MG/ML IV SOLN: 4.0000 mg | Freq: Once | INTRAVENOUS | Status: DC

## 2016-06-02 MED ORDER — GLUCAGON HCL RDNA (DIAGNOSTIC) 1 MG IJ SOLR
INTRAMUSCULAR | Status: AC
  Filled 2016-06-02: qty 2

## 2016-06-02 MED ORDER — FENTANYL CITRATE (PF) 100 MCG/2ML IJ SOLN
INTRAMUSCULAR | Status: DC | PRN
  Administered 2016-06-02 (×2): 50 ug via INTRAVENOUS

## 2016-06-02 MED ORDER — PROPOFOL 10 MG/ML IV BOLUS
INTRAVENOUS | Status: DC | PRN
  Administered 2016-06-02: 20 mg via INTRAVENOUS
  Administered 2016-06-02: 160 mg via INTRAVENOUS

## 2016-06-02 MED ORDER — PANTOPRAZOLE SODIUM 40 MG IV SOLR
40.0000 mg | INTRAVENOUS | Status: DC
  Administered 2016-06-02: 40 mg via INTRAVENOUS
  Filled 2016-06-02 (×2): qty 40

## 2016-06-02 MED ORDER — MIDAZOLAM HCL 2 MG/2ML IJ SOLN
1.0000 mg | INTRAMUSCULAR | Status: DC
Start: 2016-06-02 — End: 2016-06-02

## 2016-06-02 MED ORDER — MIDAZOLAM HCL 2 MG/2ML IJ SOLN
INTRAMUSCULAR | Status: DC | PRN
  Administered 2016-06-02: 2 mg via INTRAVENOUS

## 2016-06-02 MED ORDER — CHLORHEXIDINE GLUCONATE CLOTH 2 % EX PADS
6.0000 | MEDICATED_PAD | Freq: Once | CUTANEOUS | Status: AC
  Administered 2016-06-02: 6 via TOPICAL

## 2016-06-02 MED ORDER — SODIUM CHLORIDE 0.9 % IV SOLN: INTRAVENOUS | Status: DC

## 2016-06-02 MED ORDER — IOPAMIDOL (ISOVUE-300) INJECTION 61%
100.0000 mL | Freq: Once | INTRAVENOUS | Status: AC | PRN
  Administered 2016-06-02: 100 mL via INTRAVENOUS

## 2016-06-02 MED ORDER — ENOXAPARIN SODIUM 40 MG/0.4ML ~~LOC~~ SOLN
40.0000 mg | SUBCUTANEOUS | Status: DC
  Administered 2016-06-03: 40 mg via SUBCUTANEOUS
  Filled 2016-06-02 (×2): qty 0.4

## 2016-06-02 MED ORDER — HYDROMORPHONE HCL 1 MG/ML IJ SOLN: 0.5000 mg | INTRAMUSCULAR | Status: DC | PRN

## 2016-06-02 MED ORDER — POTASSIUM CHLORIDE IN NACL 20-0.9 MEQ/L-% IV SOLN
INTRAVENOUS | Status: DC
  Administered 2016-06-02 – 2016-06-04 (×4): via INTRAVENOUS

## 2016-06-02 MED ORDER — MORPHINE SULFATE (PF) 2 MG/ML IV SOLN
INTRAVENOUS | Status: AC
  Filled 2016-06-02: qty 2

## 2016-06-02 MED ORDER — CHLORHEXIDINE GLUCONATE CLOTH 2 % EX PADS: 6.0000 | MEDICATED_PAD | Freq: Once | CUTANEOUS | Status: DC

## 2016-06-02 MED ORDER — IOPAMIDOL (ISOVUE-300) INJECTION 61%
INTRAVENOUS | Status: AC
  Filled 2016-06-02: qty 50

## 2016-06-02 MED ORDER — LIDOCAINE HCL (CARDIAC) 10 MG/ML IV SOLN
INTRAVENOUS | Status: DC | PRN
  Administered 2016-06-02: 50 mg via INTRAVENOUS

## 2016-06-02 MED ORDER — GLYCOPYRROLATE 0.2 MG/ML IJ SOLN
INTRAMUSCULAR | Status: DC | PRN
  Administered 2016-06-02: 0.2 mg via INTRAVENOUS

## 2016-06-02 MED ORDER — ONDANSETRON HCL 4 MG PO TABS
4.0000 mg | ORAL_TABLET | Freq: Four times a day (QID) | ORAL | Status: DC | PRN
  Administered 2016-06-03: 4 mg via ORAL
  Filled 2016-06-02: qty 1

## 2016-06-02 MED ORDER — ACETAMINOPHEN 325 MG PO TABS: 650.0000 mg | ORAL_TABLET | Freq: Four times a day (QID) | ORAL | Status: DC | PRN

## 2016-06-02 MED ORDER — HYDROMORPHONE HCL 1 MG/ML IJ SOLN
1.0000 mg | Freq: Once | INTRAMUSCULAR | Status: AC
  Administered 2016-06-02: 1 mg via INTRAVENOUS
  Filled 2016-06-02: qty 1

## 2016-06-02 MED ORDER — ONDANSETRON HCL 4 MG/2ML IJ SOLN
4.0000 mg | Freq: Four times a day (QID) | INTRAMUSCULAR | Status: DC | PRN
  Administered 2016-06-03: 4 mg via INTRAVENOUS
  Filled 2016-06-02: qty 2

## 2016-06-02 MED ORDER — SODIUM CHLORIDE 0.9 % IV BOLUS (SEPSIS)
1000.0000 mL | Freq: Once | INTRAVENOUS | Status: AC
  Administered 2016-06-02: 1000 mL via INTRAVENOUS

## 2016-06-02 MED ORDER — ROCURONIUM BROMIDE 100 MG/10ML IV SOLN
INTRAVENOUS | Status: DC | PRN
  Administered 2016-06-02: 5 mg via INTRAVENOUS
  Administered 2016-06-02: 10 mg via INTRAVENOUS

## 2016-06-02 MED ORDER — SUCCINYLCHOLINE CHLORIDE 20 MG/ML IJ SOLN
INTRAMUSCULAR | Status: DC | PRN
  Administered 2016-06-02: 120 mg via INTRAVENOUS

## 2016-06-02 MED ORDER — ESCITALOPRAM OXALATE 10 MG PO TABS
10.0000 mg | ORAL_TABLET | Freq: Every day | ORAL | Status: DC
  Administered 2016-06-02 – 2016-06-03 (×2): 10 mg via ORAL
  Filled 2016-06-02 (×2): qty 1

## 2016-06-02 MED ORDER — HYDROMORPHONE HCL 1 MG/ML IJ SOLN
1.0000 mg | INTRAMUSCULAR | Status: AC | PRN
  Administered 2016-06-02 – 2016-06-03 (×2): 1 mg via INTRAVENOUS
  Filled 2016-06-02 (×2): qty 1

## 2016-06-02 MED ORDER — ONDANSETRON HCL 4 MG/2ML IJ SOLN
4.0000 mg | Freq: Once | INTRAMUSCULAR | Status: AC
  Administered 2016-06-02: 4 mg via INTRAVENOUS

## 2016-06-02 MED ORDER — LACTATED RINGERS IV SOLN
INTRAVENOUS | Status: DC
  Administered 2016-06-02: 08:00:00 via INTRAVENOUS

## 2016-06-02 MED ORDER — GLUCAGON HCL RDNA (DIAGNOSTIC) 1 MG IJ SOLR
INTRAMUSCULAR | Status: DC | PRN
  Administered 2016-06-02: .5 mg via INTRAVENOUS

## 2016-06-02 MED ORDER — ALPRAZOLAM 1 MG PO TABS
1.0000 mg | ORAL_TABLET | Freq: Every day | ORAL | Status: DC
  Administered 2016-06-02 – 2016-06-03 (×2): 1 mg via ORAL
  Filled 2016-06-02 (×2): qty 1

## 2016-06-02 MED ORDER — NICOTINE 21 MG/24HR TD PT24
21.0000 mg | MEDICATED_PATCH | Freq: Once | TRANSDERMAL | Status: DC
  Administered 2016-06-02: 21 mg via TRANSDERMAL
  Filled 2016-06-02: qty 1

## 2016-06-02 MED ORDER — NEOSTIGMINE METHYLSULFATE 10 MG/10ML IV SOLN
INTRAVENOUS | Status: DC | PRN
  Administered 2016-06-02: 2 mg via INTRAVENOUS

## 2016-06-02 MED ORDER — MORPHINE SULFATE (PF) 2 MG/ML IV SOLN
4.0000 mg | Freq: Once | INTRAVENOUS | Status: AC
  Administered 2016-06-02: 4 mg via INTRAVENOUS

## 2016-06-02 MED ORDER — HYDROMORPHONE HCL 1 MG/ML IJ SOLN
1.0000 mg | INTRAMUSCULAR | Status: AC | PRN
  Administered 2016-06-02 (×2): 1 mg via INTRAVENOUS
  Filled 2016-06-02 (×2): qty 1

## 2016-06-02 MED ORDER — ONDANSETRON HCL 4 MG/2ML IJ SOLN
4.0000 mg | Freq: Once | INTRAMUSCULAR | Status: AC
  Administered 2016-06-02: 4 mg via INTRAVENOUS
  Filled 2016-06-02: qty 2

## 2016-06-02 MED ORDER — DEXTROSE 5 % IV SOLN
1.0000 g | INTRAVENOUS | Status: DC
  Administered 2016-06-02 – 2016-06-03 (×2): 1 g via INTRAVENOUS
  Filled 2016-06-02 (×3): qty 10

## 2016-06-02 NOTE — Interval H&P Note (Signed)
History and Physical Interval Note:  06/02/2016 9:26 AM  Kathleen Stewart  has presented today for surgery, with the diagnosis of Common Bile Duct Stones  The various methods of treatment have been discussed with the patient and family. After consideration of risks, benefits and other options for treatment, the patient has consented to  Procedure(s): ENDOSCOPIC RETROGRADE CHOLANGIOPANCREATOGRAPHY (ERCP) (N/A) EXTENSION OF THE SPHINCTEROTOMY (N/A) STONE EXTRACTION (N/A) as a surgical intervention .  The patient's history has been reviewed, patient examined, no change in status, stable for surgery.  I have reviewed the patient's chart and labs.  Questions were answered to the patient's satisfaction.     [employer]

## 2016-06-02 NOTE — Discharge Instructions (Signed)
In addition to included general post-operative instructions for Laparoscopic Cholecystectomy for cholecystitis (gallbladder inflammation attributed to gallstones) with choledocholithiasis (common bile duct gallstones),  Diet: Resume home heart healthy diet. Start with non-fat foods (fruits, vegetables, and whole grains) to low-fat foods, and advance as tolerated.  Activity: No heavy lifting >20 pounds (children, pets, laundry, garbage) or strenuous activity until follow-up, but light activity and walking are encouraged. Do not drive or drink alcohol if taking narcotic pain medications.  Wound care: 2 days after surgery (Friday, 4/6), may shower/get incision wet with soapy water and pat dry (do not rub incisions), but no baths or submerging incision underwater until follow-up.   Medications: Resume all home medications. For mild to moderate pain: acetaminophen (Tylenol) or ibuprofen (if no kidney disease). Combining Tylenol with alcohol can substantially increase the risk of causing liver disease. Narcotic pain medications, if prescribed, can be used for severe pain, though may cause nausea, constipation, and drowsiness. Do not combine Tylenol and Percocet within a 6 hour period as Percocet contains Tylenol. If you do not need the narcotic pain medication, you do not need to fill the prescription.  Call office [phone]) at any time if any questions, worsening pain, fevers/chills, bleeding, drainage from incision site, or other concerns.

## 2016-06-02 NOTE — Anesthesia Procedure Notes (Signed)
Procedure Name: Intubation Date/Time: 06/02/2016 9:50 AM Performed by: Franco Nones Pre-anesthesia Checklist: Patient identified, Patient being monitored, Timeout performed, Emergency Drugs available and Suction available Patient Re-evaluated:Patient Re-evaluated prior to inductionOxygen Delivery Method: Circle System Utilized Preoxygenation: Pre-oxygenation with 100% oxygen Intubation Type: IV induction, Rapid sequence and Cricoid Pressure applied Laryngoscope Size: Miller and 2 Grade View: Grade I Tube type: Oral Tube size: 7.0 mm Number of attempts: 1 Airway Equipment and Method: Stylet and Bite block Placement Confirmation: ETT inserted through vocal cords under direct vision,  positive ETCO2 and breath sounds checked- equal and bilateral Secured at: 21 cm Tube secured with: Tape Dental Injury: Teeth and Oropharynx as per pre-operative assessment

## 2016-06-02 NOTE — Consult Note (Signed)
SURGICAL CONSULTATION NOTE (initial) - cpt: [phone]  HISTORY OF PRESENT ILLNESS (HPI):  41 y.o. female, recently discharged (05/27/2016) following ERCP (05/26/2016) for choledocholithiasis with outpatient surgical follow-up scheduled with Dr. Lovell Sheehan, presented this morning to [facility] ED with recurrence of RUQ abdominal pain that she reports began suddenly after she ate pizza last night. Prior to her recent presentation to [facility] ED for RUQ abdominal pain x 36 hours with mildly elevated bilirubin and transaminases with CT and MRCP demonstrating choledocholithiasis and mild cholecystitis, she denies any additional prior episodes. Patient this morning underwent repeat ERCP with sphincterotomy by Dr. Darrick Penna and has been tolerating clear liquids diet without further pain or N/V, fever/chills, CP, or SOB.  Surgery is consulted by medical physician Dr. Arbutus Leas in this context for evaluation and management of cholecystitis with choledocholithiasis.  PAST MEDICAL HISTORY (PMH):  Past Medical History:  Diagnosis Date  . Abdominal pain   . Abnormal Pap smear   . Anxiety   . Bloating   . Bulging lumbar disc   . Chronic back pain   . Chronic diarrhea   . Depression   . Gastroesophageal reflux   . History of abnormal cervical Pap smear 10/25/2013  . History of abnormal Pap smear 10/19/2012  . Insomnia   . Irritable bowel syndrome   . Menorrhagia 04/25/2014  . Night sweats 04/25/2014  . Pelvic pain in female 04/25/2014  . Vaginal Pap smear, abnormal      PAST SURGICAL HISTORY (PSH):  Past Surgical History:  Procedure Laterality Date  . COLONOSCOPY  12/24/2008   Dr. Darrick Penna: normal, normal colonic biopsies   . DILITATION & CURRETTAGE/HYSTROSCOPY WITH NOVASURE ABLATION N/A 05/22/2014   Procedure: DILATATION & CURETTAGE/HYSTEROSCOPY WITH NOVASURE ABLATION (cavity length 5.5 cm, cavity width 4.2 cm; power 127 watts, time 1 minute);  Surgeon: Tilda Burrow, MD;  Location: [facility];  Service: Gynecology;  Laterality: N/A;   . ERCP N/A 05/26/2016   Procedure: ENDOSCOPIC RETROGRADE CHOLANGIOPANCREATOGRAPHY (ERCP);  Surgeon: West Bali, MD;  Location: [facility];  Service: Endoscopy;  Laterality: N/A;  . HYDROGEN BREATH TEST  01/29/2009  . PANCREATIC STENT PLACEMENT  05/26/2016   Procedure: PANCREATIC STENT PLACEMENT;  Surgeon: West Bali, MD;  Location: [facility];  Service: Endoscopy;;  . SPHINCTEROTOMY N/A 05/26/2016   Procedure: SPHINCTEROTOMY;  Surgeon: West Bali, MD;  Location: [facility];  Service: Endoscopy;  Laterality: N/A;  . UPPER GASTROINTESTINAL ENDOSCOPY  12/24/2008   Dr. Darrick Penna: normal esophagus, mild gastritis     MEDICATIONS:  Prior to Admission medications   Medication Sig Start Date End Date Taking? Authorizing Provider  adapalene (DIFFERIN) 0.1 % gel Apply 1 application topically at bedtime.    Historical Provider, MD  Alpha-D-Galactosidase Charlyne Quale) TABS Take 2 tablets by mouth as needed (gas relief).     Historical Provider, MD  ALPRAZolam Prudy Feeler) 1 MG tablet Take 1 mg by mouth at bedtime.    Historical Provider, MD  Biotin 13086 MCG TBDP Take 10,000 mcg by mouth daily.    Historical Provider, MD  escitalopram (LEXAPRO) 20 MG tablet Take 10 mg by mouth at bedtime.     Historical Provider, MD  ibuprofen (ADVIL,MOTRIN) 200 MG tablet Take 400 mg by mouth every 8 (eight) hours as needed (for pain/headaches). LIQUIDGELS    Historical Provider, MD  omeprazole (PRILOSEC) 40 MG capsule Take 40 mg by mouth 2 (two) times daily.    Historical Provider, MD  ondansetron (ZOFRAN) 4 MG tablet Take 4  mg by mouth every 8 (eight) hours as needed for nausea or vomiting.    Historical Provider, MD  oxyCODONE-acetaminophen (PERCOCET/ROXICET) 5-325 MG tablet Take 1 tablet by mouth every 4 (four) hours as needed for moderate pain. 05/27/16   Catarina Hartshorn, MD  Probiotic Product (TRUBIOTICS PO) Take 1 tablet by mouth daily.     Historical Provider, MD     ALLERGIES:  Allergies  Allergen Reactions   . Adhesive [Tape] Dermatitis    Redness at adhesive sites     SOCIAL HISTORY:  Social History   Social History  . Marital status: Married    Spouse name: N/A  . Number of children: N/A  . Years of education: N/A   Occupational History  . Not on file.   Social History Main Topics  . Smoking status: Current Every Day Smoker    Packs/day: 1.00    Years: 20.00    Types: Cigarettes  . Smokeless tobacco: Never Used  . Alcohol use Yes     Comment: socially  . Drug use: No  . Sexual activity: Yes    Birth control/ protection: Other-see comments     Comment: vasectomy   Other Topics Concern  . Not on file   Social History Narrative  . No narrative on file    The patient currently resides (home / rehab facility / nursing home): Home  The patient normally is (ambulatory / bedbound): Ambulatory   FAMILY HISTORY:  Family History  Problem Relation Age of Onset  . Emphysema Father   . Other Mother     fibroid tumors-had hyst  . Colon cancer Neg Hx     REVIEW OF SYSTEMS:  Constitutional: denies weight loss, fever, chills, or sweats  Eyes: denies any other vision changes, history of eye injury  ENT: denies sore throat, hearing problems  Respiratory: denies shortness of breath, wheezing  Cardiovascular: denies chest pain, palpitations  Gastrointestinal: abdominal pain, N/V, and bowel function as per HPI Genitourinary: denies burning with urination or urinary frequency Musculoskeletal: denies any other joint pains or cramps  Skin: denies any other rashes or skin discolorations  Neurological: denies any other headache, dizziness, weakness  Psychiatric: denies any other depression, anxiety   All other review of systems were negative   VITAL SIGNS:  Temp:  [97.4 F (36.3 C)-97.6 F (36.4 C)] 97.6 F (36.4 C) (04/03 0808) Pulse Rate:  [61-74] 74 (04/03 0643) Resp:  [18-20] 18 (04/03 0643) BP: (108-113)/(57-62) 113/62 (04/03 0643) SpO2:  [98 %-100 %] 98 % (04/03  0643) Weight:  [86.2 kg (190 lb)] 86.2 kg (190 lb) (04/03 0306)     Height:  (162.6 cm) Weight: 86.2 kg (190 lb) BMI (Calculated): 32.7   INTAKE/OUTPUT:  This shift: No intake/output data recorded.  Last 2 shifts: @   PHYSICAL EXAM:  Constitutional:  -- Normal body habitus  -- Awake, alert, and oriented x3  Eyes:  -- Pupils equally round and reactive to light  -- No scleral icterus  Ear, nose, and throat:  -- No jugular venous distension  Pulmonary:  -- No crackles  -- Equal breath sounds bilaterally -- Breathing non-labored at rest Cardiovascular:  -- S1, S2 present  -- No pericardial rubs Gastrointestinal:  -- Abdomen soft with mild-/moderate- RUQ > epigastric tenderness to palpation, nondistended, no guarding/rebound  -- No abdominal masses appreciated, pulsatile or otherwise  Musculoskeletal and Integumentary:  -- Wounds or skin discoloration: None appreciated -- Extremities: B/L UE and LE FROM, hands and feet  warm, no edema  Neurologic:  -- Motor function: intact and symmetric -- Sensation: intact and symmetric   Labs:  CBC Latest Ref Rng & Units 06/02/2016 05/27/2016 05/26/2016  WBC 4.0 - 10.5 K/uL 11.6(H) 7.4 6.5  Hemoglobin 12.0 - 15.0 g/dL 40.9 81.1 91.4  Hematocrit 36.0 - 46.0 % 39.5 36.7 38.0  Platelets 150 - 400 K/uL 355 264 291   CMP Latest Ref Rng & Units 06/02/2016 05/27/2016 05/26/2016  Glucose 65 - 99 mg/dL 782(N) 87 -  BUN 6 - 20 mg/dL 10 7 -  Creatinine 5.62 - 1.00 mg/dL 1.30 8.65 -  Sodium 784 - 145 mmol/L 134(L) 137 -  Potassium 3.5 - 5.1 mmol/L 3.8 3.8 -  Chloride 101 - 111 mmol/L 101 106 -  CO2 22 - 32 mmol/L 25 25 -  Calcium 8.9 - 10.3 mg/dL 9.1 6.9(G) -  Total Protein 6.5 - 8.1 g/dL 7.0 2.9(B) 6.0(L)  Total Bilirubin 0.3 - 1.2 mg/dL 1.0 0.7 0.7  Alkaline Phos 38 - 126 U/L 101 54 61  AST 15 - 41 U/L 164(H) 31 49(H)  ALT 14 - 54 U/L 97(H) 77(H) 101(H)    Imaging studies:  ERCP (06/02/2016) - Biliary papillary stenosis,  benign. - A biliary sphincterotomy was performed/extended to a total of 6 mm. - The biliary tree was swept x 10 with a 10 mm biliary balloon, and nothing was found.  CT Abdomen and Pelvis (06/02/2016) 1. 4 mm stone within the lower common bile duct in head of pancreas. This stone appears increased in size in comparison with the prior stone and probably represents passage of a new stone. 2. Moderate gallbladder wall thickening increased from prior CT may represent developing acute cholecystitis. 3. No bile duct stent identified. 4. Additional 2-3 mm gallstones are present within the gallbladder fundus.  ERCP (05/26/2016) - The DISTAL common bile duct was segmentally dilated - CBD AND INTRAHEPATIC DUCTS WERE NORMAL with largest diameter OF THE CBD being 8 mm. - NO FILLING DEFECTS WERE APPRECIATED ON NON-OCCLUSIVE OR OCCLUSIVE CHOLANGIOGRAM. - One 4 F x 5 cm plastic stent was placed into the ventral pancreatic duct. - Biliary papillary stenosis, benign treated with biliary sphincterotomy.  MRCP (05/26/2016) 1. MRI confirms several small gallstones in the gallbladder with a single stone currently visible in the common bile duct about 2.4 cm proximal to the ampulla. This may represent proximal migration of the stone previously seen in the ampulla. Currently no biliary dilatation. Gallbladder wall thickness upper normal. No focal liver lesion. 2. Trace bilateral pleural effusions with subsegmental atelectasis in the left lower lobe.  Assessment/Plan: (ICD-10's: K67.67) 41 y.o. female with acute on chronic cholecystitis with choledocholithiasis vs benign CBD papillary stricture per ERCPs, complicated by pertinent comorbidities including irritable bowel syndrome with chronic diarrhea and chronic abdominal pain, chronic back pain with lumbar discopathy, chronic pelvic pain, generalized anxiety disorder, major depression disorder, insomnia, and GERD.   - pain control prn (minimize narcotics)   - agree  with clear liquids diet for now, NPO after midnight   - will plan for laparoscopic cholecystectomy with possible intra-operative cholangiogram tomorrow morning at 10 am  - Rocephin (ordered), medical management of comorbidities as per medical team  - smoking cessation and ambulation encouraged  - DVT prophylaxis  All of the above findings and recommendations were discussed with the patient and her family, and all of patient's and her family's questions were answered to their expressed satisfaction.  Thank you for the opportunity to participate in  this patient's care.  -- Scherrie Gerlach Earlene Plater, MD, RPVI [place] Office: [phone]

## 2016-06-02 NOTE — H&P (Signed)
History and Physical  Kathleen Stewart:[id number] DOB: 1975-05-13 DOA: 06/02/2016   PCP: Dwana Melena, MD   Patient coming from: Home  Chief Complaint: abdominal pain  HPI:  Kathleen Stewart is a 41 y.o. female with medical history of GERD, anxiety/depression, irritable bowel syndrome presented with acute onset of epigastric and right upper quadrant abdominal pain around 9 PM on 06/01/2016 assisted with nausea. The patient was recently discharged from the hospital on 05/27/2016 after being treated for choledocholithiasis. During that hospitalization, MRCP confirmed a single stone in the common bile duct. ERCP was performed by Dr. Lisbeth Renshaw.  ERCP did not reveal any stones, but showed biliary papillary stenosis. Sphincterotomy was performed and a biliary stent was placed. Cholangiogram did not reveal any residual filling defects. The patient was discharged in stable condition after her diet was advanced. She had an appointment on 06/23/2016 with Dr. Franky Macho to follow up for cholecystectomy. Unfortunately, the patient developed acute onset of abdominal pain after eating pizza at 5:30 on 06/01/2016   In the emergency department, the patient was afebrile and hemodynamically stable saturating 97% on room air. CT of the abdomen and pelvis showed a 4 mm stone in the common bile duct at the head of the pancreas that was a little increased in size versus previous CTs. There is moderate gallbladder wall thickening. AST 164, ALT 97, alkaline phosphatase 101, total bilirubin 1.0. WBC was 11.6. Gastroenterology was consulted. Neurosurgery was consulted.  Assessment/Plan: Abdominal pain and cholelithiasis and choledocholithiasis -05/26/2016 ERCP--noted to have biliary papillary stenosis but no CBD stones noted -1 plastic stent was placed in the ventral pancreatic duct -06/02/2016 CT abdomen and pelvis--4 mm stone, bile duct at the head of the pancreas increased in size versus previous. Moderate  gallbladder wall thickening. No bile duct stent visible. -GI consulted -General Surgery Consulted--spoke with Dr. Earlene Plater -pain control -IVF  Tobacco abuse -Tobacco cessation discussed  Anxiety/depression -Continue home dose Lexapro and alprazolam  Diarrhea -pt has chronic diarrhea but endorsed 8 BM yesterday -check c.diff        Past Medical History:  Diagnosis Date  . Abdominal pain   . Abnormal Pap smear   . Anxiety   . Bloating   . Bulging lumbar disc   . Chronic back pain   . Chronic diarrhea   . Depression   . Gastroesophageal reflux   . History of abnormal cervical Pap smear 10/25/2013  . History of abnormal Pap smear 10/19/2012  . Insomnia   . Irritable bowel syndrome   . Menorrhagia 04/25/2014  . Night sweats 04/25/2014  . Pelvic pain in female 04/25/2014  . Vaginal Pap smear, abnormal    Past Surgical History:  Procedure Laterality Date  . COLONOSCOPY  12/24/2008   Dr. Darrick Penna: normal, normal colonic biopsies   . DILITATION & CURRETTAGE/HYSTROSCOPY WITH NOVASURE ABLATION N/A 05/22/2014   Procedure: DILATATION & CURETTAGE/HYSTEROSCOPY WITH NOVASURE ABLATION (cavity length 5.5 cm, cavity width 4.2 cm; power 127 watts, time 1 minute);  Surgeon: Tilda Burrow, MD;  Location: [facility];  Service: Gynecology;  Laterality: N/A;  . ERCP N/A 05/26/2016   Procedure: ENDOSCOPIC RETROGRADE CHOLANGIOPANCREATOGRAPHY (ERCP);  Surgeon: West Bali, MD;  Location: [facility];  Service: Endoscopy;  Laterality: N/A;  . HYDROGEN BREATH TEST  01/29/2009  . PANCREATIC STENT PLACEMENT  05/26/2016   Procedure: PANCREATIC STENT PLACEMENT;  Surgeon: West Bali, MD;  Location: [facility];  Service: Endoscopy;;  . SPHINCTEROTOMY N/A  05/26/2016   Procedure: SPHINCTEROTOMY;  Surgeon: West Bali, MD;  Location: [facility];  Service: Endoscopy;  Laterality: N/A;  . UPPER GASTROINTESTINAL ENDOSCOPY  12/24/2008   Dr. Darrick Penna: normal esophagus, mild gastritis   Social History:   reports that she has been smoking Cigarettes.  She has a 20.00 pack-year smoking history. She has never used smokeless tobacco. She reports that she drinks alcohol. She reports that she does not use drugs.   Family History  Problem Relation Age of Onset  . Emphysema Father   . Other Mother     fibroid tumors-had hyst  . Colon cancer Neg Hx      Allergies  Allergen Reactions  . Adhesive [Tape] Dermatitis    Redness at adhesive sites     Prior to Admission medications   Medication Sig Start Date End Date Taking? Authorizing Provider  adapalene (DIFFERIN) 0.1 % gel Apply 1 application topically at bedtime.    Historical Provider, MD  Alpha-D-Galactosidase Charlyne Quale) TABS Take 2 tablets by mouth as needed (gas relief).     Historical Provider, MD  ALPRAZolam Prudy Feeler) 1 MG tablet Take 1 mg by mouth at bedtime.    Historical Provider, MD  Biotin 16109 MCG TBDP Take 10,000 mcg by mouth daily.    Historical Provider, MD  escitalopram (LEXAPRO) 20 MG tablet Take 10 mg by mouth at bedtime.     Historical Provider, MD  ibuprofen (ADVIL,MOTRIN) 200 MG tablet Take 400 mg by mouth every 8 (eight) hours as needed (for pain/headaches). LIQUIDGELS    Historical Provider, MD  omeprazole (PRILOSEC) 40 MG capsule Take 40 mg by mouth 2 (two) times daily.    Historical Provider, MD  ondansetron (ZOFRAN) 4 MG tablet Take 4 mg by mouth every 8 (eight) hours as needed for nausea or vomiting.    Historical Provider, MD  oxyCODONE-acetaminophen (PERCOCET/ROXICET) 5-325 MG tablet Take 1 tablet by mouth every 4 (four) hours as needed for moderate pain. 05/27/16   Catarina Hartshorn, MD  Probiotic Product (TRUBIOTICS PO) Take 1 tablet by mouth daily.     Historical Provider, MD    Review of Systems:  Constitutional:  No weight loss, night sweats, Fevers, chills, fatigue.  Head&Eyes: No headache.  No vision loss.  No eye pain or scotoma ENT:  No Difficulty swallowing,Tooth/dental problems,Sore throat,  No ear ache, post  nasal drip,  Cardio-vascular:  No chest pain, Orthopnea, PND, swelling in lower extremities,  dizziness, palpitations  GI:  No  vomiting, diarrhea, loss of appetite, hematochezia, melena, heartburn, indigestion, Resp:  No shortness of breath with exertion or at rest. No cough. No coughing up of blood .No wheezing.No chest wall deformity  Skin:  no rash or lesions.  GU:  no dysuria, change in color of urine, no urgency or frequency. No flank pain.  Musculoskeletal:  No joint pain or swelling. No decreased range of motion. No back pain.  Psych:  No change in mood or affect. No depression or anxiety. Neurologic: No headache, no dysesthesia, no focal weakness, no vision loss. No syncope  Physical Exam: Vitals:   06/02/16 0305 06/02/16 0306 06/02/16 0643  BP: (!) 108/57  113/62  Pulse: 61  74  Resp: 20  18  Temp: 97.4 F (36.3 C)  97.6 F (36.4 C)  TempSrc: Oral  Oral  SpO2: 100%  98%  Weight:  86.2 kg (190 lb)   Height:   (1.626 m)    General:  A&O x 3, NAD, nontoxic,  pleasant/cooperative Head/Eye: No conjunctival hemorrhage, no icterus, [place]/AT, No nystagmus ENT:  No icterus,  No thrush, good dentition, no pharyngeal exudate Neck:  No masses, no lymphadenpathy, no bruits CV:  RRR, no rub, no gallop, no S3 Lung:  CTAB, good air movement, no wheeze, no rhonchi Abdomen: soft/epigastric pain, +BS, nondistended, no peritoneal signs Ext: No cyanosis, No rashes, No petechiae, No lymphangitis, No edema Neuro: CNII-XII intact, strength 4/5 in bilateral upper and lower extremities, no dysmetria  Labs on Admission:  Basic Metabolic Panel:  Recent Labs Lab 05/27/16 0519 06/02/16 0337  NA 137 134*  K 3.8 3.8  CL 106 101  CO2 25 25  GLUCOSE 87 130*  BUN 7 10  CREATININE 0.85 0.93  CALCIUM 8.2* 9.1   Liver Function Tests:  Recent Labs Lab 05/26/16 1654 05/27/16 0519 06/02/16 0337  AST 49* 31 164*  ALT 101* 77* 97*  ALKPHOS 61 54 101  BILITOT 0.7 0.7 1.0  PROT  6.0* 5.8* 7.0  ALBUMIN 3.4* 3.2* 4.1    Recent Labs Lab 05/27/16 0519 06/02/16 0337  LIPASE 18 21   No results for input(s): AMMONIA in the last 168 hours. CBC:  Recent Labs Lab 05/27/16 0519 06/02/16 0337  WBC 7.4 11.6*  NEUTROABS  --  9.6*  HGB 12.1 13.4  HCT 36.7 39.5  MCV 97.3 93.4  PLT 264 355   Coagulation Profile: No results for input(s): INR, PROTIME in the last 168 hours. Cardiac Enzymes: No results for input(s): CKTOTAL, CKMB, CKMBINDEX, TROPONINI in the last 168 hours. BNP: Invalid input(s): POCBNP CBG: No results for input(s): GLUCAP in the last 168 hours. Urine analysis:    Component Value Date/Time   COLORURINE YELLOW 05/26/2016 1201   APPEARANCEUR CLEAR 05/26/2016 1201   LABSPEC 1.024 05/26/2016 1201   PHURINE 5.0 05/26/2016 1201   GLUCOSEU NEGATIVE 05/26/2016 1201   HGBUR SMALL (A) 05/26/2016 1201   BILIRUBINUR NEGATIVE 05/26/2016 1201   KETONESUR NEGATIVE 05/26/2016 1201   PROTEINUR NEGATIVE 05/26/2016 1201   UROBILINOGEN 0.2 05/21/2014 1405   NITRITE NEGATIVE 05/26/2016 1201   LEUKOCYTESUR NEGATIVE 05/26/2016 1201   Sepsis Labs: (procalcitonin:4,lacticidven:4) ) Recent Results (from the past 240 hour(s))  Surgical pcr screen     Status: None   Collection Time: 05/25/16  8:03 PM  Result Value Ref Range Status   MRSA, PCR NEGATIVE NEGATIVE Final   Staphylococcus aureus NEGATIVE NEGATIVE Final    Comment:        The Xpert SA Assay (FDA approved for NASAL specimens in patients over 33 years of age), is one component of a comprehensive surveillance program.  Test performance has been validated by [facility] for patients greater than or equal to 65 year old. It is not intended to diagnose infection nor to guide or monitor treatment.      Radiological Exams on Admission: Ct Abdomen Pelvis W Contrast  Result Date: 06/02/2016 CLINICAL DATA:  41 y/o F; worsening abdominal pain and increasing liver function tests. Bile ducts  stent placed last week. EXAM: CT ABDOMEN AND PELVIS WITH CONTRAST TECHNIQUE: Multidetector CT imaging of the abdomen and pelvis was performed using the standard protocol following bolus administration of intravenous contrast. CONTRAST:  ISOVUE-300 IOPAMIDOL (ISOVUE-300) INJECTION 61% COMPARISON:  05/26/2016 MRI of the abdomen and 05/25/2016 CT of the abdomen and pelvis. FINDINGS: Lower chest: No acute abnormality. Hepatobiliary: No focal liver lesion. No intra or extrahepatic biliary ductal dilatation. 3 mm gallstones are present within the gallbladder fundus. Moderate diffuse gallbladder  wall thickening increased from prior CT. 4 mm stone within the lower common bile duct. No biliary stent identified. Pancreas: Unremarkable. No pancreatic ductal dilatation or surrounding inflammatory changes. Spleen: Normal in size without focal abnormality. Adrenals/Urinary Tract: Adrenal glands are unremarkable. Kidneys are normal, without renal calculi, focal lesion, or hydronephrosis. Bladder is unremarkable. Stomach/Bowel: Stomach is within normal limits. Appendix appears normal. No evidence of bowel wall thickening, distention, or inflammatory changes. Vascular/Lymphatic: Aortic atherosclerosis with mild calcification. No enlarged abdominal or pelvic lymph nodes. Reproductive: Uterus and bilateral adnexa are unremarkable. Other: No abdominal wall hernia or abnormality. No abdominopelvic ascites. Musculoskeletal: Stable grade 1 L5-S1 anterolisthesis and chronic pars defects. With IMPRESSION: 1. 4 mm stone within the lower common bile duct in head of pancreas. This stone appears increased in size in comparison with the prior stone and probably represents passage of a new stone. 2. Moderate gallbladder wall thickening increased from prior CT may represent developing acute cholecystitis. 3. No bile duct stent identified. 4. Additional 2-3 mm gallstones are present within the gallbladder fundus. Electronically Signed   By:  Mitzi Hansen M.D.   On: 06/02/2016 06:13        Time spent:60 minutes Code Status:   FULL Family Communication:  No Family at bedside Disposition Plan: expect 2-3 day hospitalization Consults called: GI--Fields; General Surgery--Davis DVT Prophylaxis: [place] Lovenox  Delrae Hagey, DO  [facility] Pager [phone]  If 7PM-7AM, please contact night-coverage [email or web address] Password [email or web address] 06/02/2016, 7:50 AM

## 2016-06-02 NOTE — Anesthesia Postprocedure Evaluation (Signed)
Anesthesia Post Note  Patient: Kathleen Stewart  Procedure(s) Performed: Procedure(s) (LRB): ENDOSCOPIC RETROGRADE CHOLANGIOPANCREATOGRAPHY (ERCP) (N/A) SPHINCTEROTOMY (N/A)  Patient location during evaluation: PACU Anesthesia Type: General Level of consciousness: awake Pain management: satisfactory to patient Vital Signs Assessment: post-procedure vital signs reviewed and stable Respiratory status: spontaneous breathing Cardiovascular status: stable Anesthetic complications: no     Last Vitals:  Vitals:   06/02/16 1115 06/02/16 1130  BP: 112/68 109/69  Pulse: 90 81  Resp: (!) 23 17  Temp:      Last Pain:  Vitals:   06/02/16 1127  TempSrc:   PainSc: 2                  Teng Decou

## 2016-06-02 NOTE — ED Notes (Signed)
Patient ambulated to bathroom and returned to the room without difficulty.

## 2016-06-02 NOTE — Op Note (Signed)
[facility] Patient Name: Kathleen Stewart Procedure Date: 06/02/2016 8:23 AM MRN: [id number] Date of Birth: [birth year] Attending MD: Jonette Eva , MD CSN: [id number] Age: 41 Admit Type: Outpatient Procedure:                ERCP WITH EXTENSION OF SPHICTEROTOMY AND                            CHOLANGIOGRAM Indications:              Bile duct stone(s) Providers:                Jonette Eva, MD, Loma Messing B. Patsy Lager, RN, Dyann Ruddle Referring MD:             Catalina Pizza, MD Medicines:                General Anesthesia, Glucagon 0.5 mg IV Complications:            No immediate complications. Estimated Blood Loss:     Estimated blood loss: none. Procedure:                Pre-Anesthesia Assessment:                           - Prior to the procedure, a History and Physical                            was performed, and patient medications and                            allergies were reviewed. The patient's tolerance of                            previous anesthesia was also reviewed. The risks                            and benefits of the procedure and the sedation                            options and risks were discussed with the patient.                            All questions were answered, and informed consent                            was obtained. Prior Anticoagulants: The patient has                            taken ibuprofen. ASA Grade Assessment: II - A                            patient with mild systemic disease. After reviewing  the risks and benefits, the patient was deemed in                            satisfactory condition to undergo the procedure.                            After obtaining informed consent, the scope was                            passed under direct vision. Throughout the                            procedure, the patient's blood pressure, pulse, and                            oxygen saturations were  monitored continuously. The                            ZO-[id number] ([id number]) scope was introduced through                            the mouth, and used to inject contrast into and                            used to inject contrast into the bile duct. The                            ERCP was accomplished without difficulty. The                            patient tolerated the procedure well. Scope In: 10:16:26 AM Scope Out: 10:48:37 AM Total Procedure Duration: 0 hours 32 minutes 11 seconds  Findings:      The scope was passed through the upper GI tract without discovering UGI       findings. The bile duct was deeply cannulated with the traction       (standard) sphincterotome. Contrast was injected. I personally       interpreted the bile duct images. Ductal flow of contrast was adequate.       Image quality was excellent. Contrast extended to the hepatic ducts. The       biliary orifice was stenotic. This appeared benign. Neither stones nor       ductal dilatation were present in the common bile duct. The common       hepatic duct and left and right hepatic ducts and all intrahepatic       branches were normal. The biliary sphincterotomy was extended to a total       of 6 mm in length with a traction (standard) sphincterotome using       blended current. There was no post-sphincterotomy bleeding. To discover       objects, the biliary tree was swept with a 10 mm balloon starting at the       upper third of the main bile duct(10 TIMES). No FRAGMENTS VIASUALIZED AT       ORIFICIE. Impression:               -  Biliary papillary stenosis, benign.                           - A biliary sphincterotomy was performed.                           - The biliary tree was swept and nothing was found. Moderate Sedation:      Per Anesthesia Care Recommendation:           - Return patient to hospital ward.                           - Refer to a surgeon today.                           - Continue present  medications. NPO X4 HRS. FULL                            LIQUIDS FOR 3 HRS.LOW FAT DIET AT 5 PM.                           -I PEROSNALLY REVIOEWED FILMA FROM MAR 2018.                            DEFINITE STION EIN BILE DUCT. DISCUSSED WITH                            SURGERY(DR. DAVIS). PLAN FOR CHOLECYSTECTOMY APR 4                            WITH IOC IF FEASIBLE. OTHERWISE PT WILL NEED                            EUS/ERCP WITH SPYGLASS.                           - Watch for pancreatitis, bleeding, perforation,                            and cholangitis. Procedure Code(s):        --- Professional ---                           [phone], Endoscopic retrograde                            cholangiopancreatography (ERCP); with                            sphincterotomy/papillotomy                           [phone], Endoscopic catheterization of the biliary                            ductal system, radiological supervision and  interpretation Diagnosis Code(s):        --- Professional ---                           K83.1, Obstruction of bile duct                           K80.50, Calculus of bile duct without cholangitis                            or cholecystitis without obstruction CPT copyright 2016 [employer]. All rights reserved. The codes documented in this report are preliminary and upon coder review may  be revised to meet current compliance requirements. Jonette Eva, MD Jonette Eva, MD 06/02/2016 11:25:01 AM This report has been signed electronically. Number of Addenda: 0

## 2016-06-02 NOTE — Anesthesia Preprocedure Evaluation (Signed)
Anesthesia Evaluation  Patient identified by MRN, date of birth, ID band Patient awake    Reviewed: Allergy & Precautions, NPO status , Patient's Chart, lab work & pertinent test results  Airway Mallampati: I  TM Distance: >3 FB     Dental  (+) Teeth Intact, Partial Lower, Partial Upper   Pulmonary Current Smoker,    breath sounds clear to auscultation       Cardiovascular negative cardio ROS   Rhythm:Regular Rate:Normal     Neuro/Psych PSYCHIATRIC DISORDERS Anxiety Depression    GI/Hepatic GERD  Medicated and Controlled,  Endo/Other    Renal/GU      Musculoskeletal Chronic LBP   Abdominal   Peds  Hematology   Anesthesia Other Findings   Reproductive/Obstetrics                             Anesthesia Physical Anesthesia Plan  ASA: II  Anesthesia Plan: General   Post-op Pain Management:    Induction: Intravenous, Rapid sequence and Cricoid pressure planned  Airway Management Planned: Oral ETT  Additional Equipment:   Intra-op Plan:   Post-operative Plan: Extubation in OR  Informed Consent: I have reviewed the patients History and Physical, chart, labs and discussed the procedure including the risks, benefits and alternatives for the proposed anesthesia with the patient or authorized representative who has indicated his/her understanding and acceptance.     Plan Discussed with:   Anesthesia Plan Comments:         Anesthesia Quick Evaluation

## 2016-06-02 NOTE — H&P (View-Only) (Signed)
Subjective: Abdominal pain still present but improved from admission. No N/V. Tolerated full liquids yesterday evening. Afebrile.   Objective: Vital signs in last 24 hours: Temp:  [97.6 F (36.4 C)-97.8 F (36.6 C)] 97.7 F (36.5 C) (03/27 0517) Pulse Rate:  [63-76] 76 (03/27 0517) Resp:  [18] 18 (03/27 0517) BP: (104-118)/(55-61) 104/55 (03/27 0517) SpO2:  [92 %-99 %] 92 % (03/27 0517) Last BM Date: 05/24/16 General:   Alert and oriented, pleasant Head:  Normocephalic and atraumatic. Eyes:  No icterus, sclera clear. Conjuctiva pink.  Mouth:  Without lesions, mucosa pink and moist.  Abdomen:  Bowel sounds present, soft, TTP epigastric/LUQ/RUQ, no rebound or guarding Extremities:  Without edema. Neurologic:  Alert and  oriented x4 Psych:  Alert and cooperative. Normal mood and affect.  Intake/Output from previous day: 03/26 0701 - 03/27 0700 In: 1574.2 [I.V.:1574.2] Out: -  Intake/Output this shift: No intake/output data recorded.  Lab Results:  Recent Labs  05/25/16 0357 05/26/16 0504  WBC 10.8* 6.5  HGB 13.9 12.6  HCT 40.4 38.0  PLT 327 291   BMET  Recent Labs  05/25/16 0357 05/26/16 0504  NA 132* 137  K 3.7 3.8  CL 99* 106  CO2 25 25  GLUCOSE 157* 92  BUN 11 6  CREATININE 0.82 0.71  CALCIUM 9.0 8.4*   LFT  Recent Labs  05/25/16 0357 05/26/16 0504  PROT 7.1 6.0*  ALBUMIN 4.1 3.4*  AST 294* 76*  ALT 206* 138*  ALKPHOS 80 64  BILITOT 1.3* 0.6  BILIDIR  --  0.1  IBILI  --  0.5     Studies/Results: Ct Abdomen Pelvis W Contrast  Result Date: 05/25/2016 CLINICAL DATA:  Upper abdominal pain.  Right upper quadrant pain. EXAM: CT ABDOMEN AND PELVIS WITH CONTRAST TECHNIQUE: Multidetector CT imaging of the abdomen and pelvis was performed using the standard protocol following bolus administration of intravenous contrast. CONTRAST:  100 cc Isovue-300 IV COMPARISON:  None. FINDINGS: Lower chest: Dependent atelectasis at the lung bases, possible  scarring in the left lower lobe. Hepatobiliary: No focal hepatic lesion. Multiple calcified gallstones within the gallbladder. Mild gallbladder wall thickening. Calcified 2 mm stone in the distal most common bile duct. Mild intra and extrahepatic biliary dilatation, the common bile duct measures at the porta hepatis. Borderline hepatic steatosis. Pancreas: No ductal dilatation or inflammation. Spleen: Normal in size without focal abnormality. Adrenals/Urinary Tract: Normal adrenal glands. No hydronephrosis. Symmetric renal enhancement and excretion on delayed phase imaging. Urinary bladder is near completely decompressed. Stomach/Bowel: Stomach is physiologically distended with ingested contrast. The appendix is normal. Colon is decompressed limiting assessment, no evidence of colonic inflammation. No small bowel wall thickening, distention or inflammation. Vascular/Lymphatic: Retroaortic left renal vein, incidentally noted. Trace abdominal aortic atherosclerosis. No adenopathy. Reproductive: 2 cm follicular cyst in the right ovary is likely incidental. Uterus and left ovary are unremarkable. Small amount of simple free fluid in the pelvic cul-de-sac. Other: No free air. No abdominal ascites. Small fat containing umbilical hernia. Musculoskeletal: Bilateral L5 pars interarticularis defects with trace anterolisthesis of L5 on S1. There are no acute or suspicious osseous abnormalities. IMPRESSION: 1. Cholelithiasis with a choledocholithiasis, a 2 mm stone is in the distal common bile duct. Resultant mild biliary dilatation and gallbladder inflammation. 2. Minimal aortic atherosclerosis. Electronically Signed   By: Rubye Oaks M.D.   On: 05/25/2016 05:05    Assessment: 41 year old female presenting with choledocholithiasis, no evidence of cholangitis, improved LFTs this morning. Will proceed with  MRI/MRCP this morning to assess if stone passed. Spoke with Vincenza Hews in MRI, and this will be done around 10 or so.  Discussed with patient, who is agreeable to MRI/MRCP this morning.    Plan: MRI/MRCP Patient would like to be blindfolded prior to entering MRI room (relayed this to [facility]) Imaging will be reviewed when available and decide if ERCP is indicated Spoke with Dr. Lovell Sheehan briefly regarding patient's hospital course. He will see her as an outpatient for evaluation for cholecystectomy; no need to see as inpatient currently.   Gelene Mink, ANP-BC [facility]      LOS: 0 days    05/26/2016, 7:44 AM

## 2016-06-02 NOTE — Transfer of Care (Signed)
Immediate Anesthesia Transfer of Care Note  Patient: Kathleen Stewart  Procedure(s) Performed: Procedure(s): ENDOSCOPIC RETROGRADE CHOLANGIOPANCREATOGRAPHY (ERCP) (N/A) EXTENSION OF THE SPHINCTEROTOMY (N/A)  Patient Location: PACU  Anesthesia Type:General  Level of Consciousness: awake and patient cooperative  Airway & Oxygen Therapy: Patient Spontanous Breathing  Post-op Assessment: Report given to RN and Post -op Vital signs reviewed and stable  Post vital signs: Reviewed and stable  Last Vitals:  Vitals:   06/02/16 0643 06/02/16 0808  BP: 113/62   Pulse: 74   Resp: 18   Temp: 36.4 C 36.4 C    Last Pain:  Vitals:   06/02/16 0808  TempSrc: Oral  PainSc: 0-No pain      Patients Stated Pain Goal: 5 (06/02/16 1610)  Complications: No apparent anesthesia complications

## 2016-06-02 NOTE — ED Provider Notes (Addendum)
[facility] Provider Note   CSN: [id number] Arrival date & time: 06/02/16  0152     History   Chief Complaint Chief Complaint  Patient presents with  . Abdominal Pain    HPI Kathleen Stewart is a 41 y.o. female.  HPI  This is a 41 year old female who presents with abdominal pain. Recent history of choledocholithiasis status post stent in the pancreas. She was discharged on Wednesday. States that she has been fairly comfortable and is not had to take much pain medication. She does state that last night she had acute onset of epigastric and right upper quadrant pain. She states that it is worse than when she first presented. It is sharp and nonradiating. Currently it is 10 out of 10. She did not take anything for the pain. She reports nausea without vomiting. She does report diarrhea. No bloody stools. Denies fevers.  Past Medical History:  Diagnosis Date  . Abdominal pain   . Abnormal Pap smear   . Anxiety   . Bloating   . Bulging lumbar disc   . Chronic back pain   . Chronic diarrhea   . Depression   . Gastroesophageal reflux   . History of abnormal cervical Pap smear 10/25/2013  . History of abnormal Pap smear 10/19/2012  . Insomnia   . Irritable bowel syndrome   . Menorrhagia 04/25/2014  . Night sweats 04/25/2014  . Pelvic pain in female 04/25/2014  . Vaginal Pap smear, abnormal     Patient Active Problem List   Diagnosis Date Noted  . Elevated liver function tests   . Elevated LFTs   . Choledocholithiasis 05/25/2016  . Menorrhagia 04/25/2014  . Night sweats 04/25/2014  . Pelvic pain in female 04/25/2014  . History of abnormal cervical Pap smear 10/25/2013  . History of abnormal Pap smear 10/19/2012  . ABDOMINAL BLOATING 04/12/2009  . Irritable bowel syndrome 01/02/2009  . ANXIETY 11/30/2008  . GERD 11/30/2008  . BACK PAIN, CHRONIC 11/30/2008  . DIARRHEA, CHRONIC 11/30/2008  . ABDOMINAL PAIN 11/30/2008  . FRACTURE, PELVIS, RIGHT 11/30/2008    Past  Surgical History:  Procedure Laterality Date  . COLONOSCOPY  12/24/2008   Dr. Darrick Penna: normal, normal colonic biopsies   . DILITATION & CURRETTAGE/HYSTROSCOPY WITH NOVASURE ABLATION N/A 05/22/2014   Procedure: DILATATION & CURETTAGE/HYSTEROSCOPY WITH NOVASURE ABLATION (cavity length 5.5 cm, cavity width 4.2 cm; power 127 watts, time 1 minute);  Surgeon: Tilda Burrow, MD;  Location: [facility] ORS;  Service: Gynecology;  Laterality: N/A;  . ERCP N/A 05/26/2016   Procedure: ENDOSCOPIC RETROGRADE CHOLANGIOPANCREATOGRAPHY (ERCP);  Surgeon: West Bali, MD;  Location: [facility] ENDO SUITE;  Service: Endoscopy;  Laterality: N/A;  . HYDROGEN BREATH TEST  01/29/2009  . PANCREATIC STENT PLACEMENT  05/26/2016   Procedure: PANCREATIC STENT PLACEMENT;  Surgeon: West Bali, MD;  Location: [facility] ENDO SUITE;  Service: Endoscopy;;  . SPHINCTEROTOMY N/A 05/26/2016   Procedure: SPHINCTEROTOMY;  Surgeon: West Bali, MD;  Location: [facility] SU[facility];  Service: Endoscopy;  Laterality: N/A;  . UPPER GASTROINTESTINAL ENDOSCOPY  12/24/2008   Dr. Darrick Penna: normal esophagus, mild gastritis    OB History    Gravida Para Term Preterm AB Living   SAB TAB Ectopic Multiple Live Births   Home Medications    Prior to Admission medications   Medication Sig Start Date End Date Taking? Authorizing  Provider  adapalene (DIFFERIN) 0.1 % gel Apply 1 application topically at bedtime.    Historical Provider, MD  Alpha-D-Galactosidase Charlyne Quale) TABS Take 2 tablets by mouth as needed (gas relief).     Historical Provider, MD  ALPRAZolam Prudy Feeler) 1 MG tablet Take 1 mg by mouth at bedtime.    Historical Provider, MD  Biotin 16109 MCG TBDP Take 10,000 mcg by mouth daily.    Historical Provider, MD  escitalopram (LEXAPRO) 20 MG tablet Take 10 mg by mouth at bedtime.     Historical Provider, MD  ibuprofen (ADVIL,MOTRIN) 200 MG tablet Take 400 mg by mouth every 8 (eight) hours as needed (for pain/headaches). LIQUIDGELS     Historical Provider, MD  omeprazole (PRILOSEC) 40 MG capsule Take 40 mg by mouth 2 (two) times daily.    Historical Provider, MD  ondansetron (ZOFRAN) 4 MG tablet Take 4 mg by mouth every 8 (eight) hours as needed for nausea or vomiting.    Historical Provider, MD  oxyCODONE-acetaminophen (PERCOCET/ROXICET) 5-325 MG tablet Take 1 tablet by mouth every 4 (four) hours as needed for moderate pain. 05/27/16   Catarina Hartshorn, MD  Probiotic Product (TRUBIOTICS PO) Take 1 tablet by mouth daily.     Historical Provider, MD    Family History Family History  Problem Relation Age of Onset  . Emphysema Father   . Other Mother     fibroid tumors-had hyst  . Colon cancer Neg Hx     Social History Social History  Substance Use Topics  . Smoking status: Current Every Day Smoker    Packs/day: 1.00    Years: 20.00    Types: Cigarettes  . Smokeless tobacco: Never Used  . Alcohol use Yes     Comment: socially     Allergies   Adhesive [tape]   Review of Systems Review of Systems  Constitutional: Negative for fever.  Respiratory: Negative for shortness of breath.   Cardiovascular: Negative for chest pain.  Gastrointestinal: Positive for abdominal pain, diarrhea and nausea. Negative for constipation and vomiting.  Genitourinary: Negative for dysuria.  All other systems reviewed and are negative.    Physical Exam Updated Vital Signs BP (!) 108/57 (BP Location: Left Arm)   Pulse 61   Temp 97.4 F (36.3 C) (Oral)   Resp 20   Ht  (1.626 m)   Wt 190 lb (86.2 kg)   SpO2 100%   BMI 32.61 kg/m   Physical Exam  Constitutional: She is oriented to person, place, and time. No distress.  Uncomfortable appearing, no acute distress  HENT:  Head: Normocephalic and atraumatic.  Cardiovascular: Normal rate, regular rhythm and normal heart sounds.   Pulmonary/Chest: Effort normal and breath sounds normal. No respiratory distress. She has no wheezes.  Abdominal: Soft. Bowel sounds are normal.  There is tenderness. There is no rebound and no guarding.  Epigastric and right upper quadrant tenderness to palpation without rebound or guarding  Neurological: She is alert and oriented to person, place, and time.  Skin: Skin is warm and dry.  Psychiatric: She has a normal mood and affect.  Nursing note and vitals reviewed.    ED Treatments / Results  Labs (all labs ordered are listed, but only abnormal results are displayed) Labs Reviewed  CBC WITH DIFFERENTIAL/PLATELET - Abnormal; Notable for the following:       Result Value   WBC 11.6 (*)    Neutro Abs 9.6 (*)    All other components within normal limits  COMPREHENSIVE METABOLIC PANEL - Abnormal; Notable for the following:    Sodium 134 (*)    Glucose, Bld 130 (*)    AST 164 (*)    ALT 97 (*)    All other components within normal limits  LIPASE, BLOOD  POC URINE PREG, ED    EKG  EKG Interpretation None       Radiology Ct Abdomen Pelvis W Contrast  Result Date: 06/02/2016 CLINICAL DATA:  41 y/o F; worsening abdominal pain and increasing liver function tests. Bile ducts stent placed last week. EXAM: CT ABDOMEN AND PELVIS WITH CONTRAST TECHNIQUE: Multidetector CT imaging of the abdomen and pelvis was performed using the standard protocol following bolus administration of intravenous contrast. CONTRAST:  ISOVUE-300 IOPAMIDOL (ISOVUE-300) INJECTION 61% COMPARISON:  05/26/2016 MRI of the abdomen and 05/25/2016 CT of the abdomen and pelvis. FINDINGS: Lower chest: No acute abnormality. Hepatobiliary: No focal liver lesion. No intra or extrahepatic biliary ductal dilatation. 3 mm gallstones are present within the gallbladder fundus. Moderate diffuse gallbladder wall thickening increased from prior CT. 4 mm stone within the lower common bile duct. No biliary stent identified. Pancreas: Unremarkable. No pancreatic ductal dilatation or surrounding inflammatory changes. Spleen: Normal in size without focal abnormality.  Adrenals/Urinary Tract: Adrenal glands are unremarkable. Kidneys are normal, without renal calculi, focal lesion, or hydronephrosis. Bladder is unremarkable. Stomach/Bowel: Stomach is within normal limits. Appendix appears normal. No evidence of bowel wall thickening, distention, or inflammatory changes. Vascular/Lymphatic: Aortic atherosclerosis with mild calcification. No enlarged abdominal or pelvic lymph nodes. Reproductive: Uterus and bilateral adnexa are unremarkable. Other: No abdominal wall hernia or abnormality. No abdominopelvic ascites. Musculoskeletal: Stable grade 1 L5-S1 anterolisthesis and chronic pars defects. With IMPRESSION: 1. 4 mm stone within the lower common bile duct in head of pancreas. This stone appears increased in size in comparison with the prior stone and probably represents passage of a new stone. 2. Moderate gallbladder wall thickening increased from prior CT may represent developing acute cholecystitis. 3. No bile duct stent identified. 4. Additional 2-3 mm gallstones are present within the gallbladder fundus. Electronically Signed   By: Mitzi Hansen M.D.   On: 06/02/2016 06:13    Procedures Procedures (including critical care time)  Medications Ordered in ED Medications  HYDROmorphone (DILAUDID) injection 1 mg (not administered)  nicotine (NICODERM CQ - dosed in mg/24 hours) patch 21 mg (not administered)  sodium chloride 0.9 % bolus 1,000 mL (0 mLs Intravenous Stopped 06/02/16 0559)  ondansetron (ZOFRAN) injection 4 mg (4 mg Intravenous Given 06/02/16 0359)  morphine 2 MG/ML injection 4 mg (4 mg Intravenous Given 06/02/16 0400)  iopamidol (ISOVUE-300) 61 % injection 100 mL (100 mLs Intravenous Contrast Given 06/02/16 0543)     Initial Impression / Assessment and Plan / ED Course  I have reviewed the triage vital signs and the nursing notes.  Pertinent labs & imaging results that were available during my care of the patient were reviewed by me and considered  in my medical decision making (see chart for details).     She presents with recurrent epigastric and right upper quadrant pain. Currently has a pancreatic stent for choledocholithiasis.  She is uncomfortable appearing but nontoxic. Tender on exam. Lab work notable for mild leukocytosis. LFTs are trending back upwards. Cannot get an ultrasound at this time. I discussed the patient with Dr. Darrick Penna, she recommends repeat CT imaging given inability to obtain ultrasound.  The CT shows a 4 mm stone. Given that this is larger than her  prior stone, this likely represents passage of a new stone. She also has increased gallbladder wall thickening when compared to prior. Suggestive of cholecystitis. I updated Dr. Darrick Penna. She will evaluate the patient. Will plan to admit to the hospitalist  6:41 AM Discussed the patient with Dr. Conley Rolls.  Plan for admission to med surg bed. He requested I place orders. She was placed in patient's status.  She will be a carryover to the day team.  Final Clinical Impressions(s) / ED Diagnoses   Final diagnoses:  Choledocholithiasis  Acute cholecystitis    New Prescriptions New Prescriptions   No medications on file     Shon Baton, MD 06/02/16 1610    Shon Baton, MD 06/02/16 [id number]

## 2016-06-02 NOTE — ED Triage Notes (Signed)
Pt c/o generalized abdominal pain that started last night around 2130; pt states she had a stent placed in her bile duct last week

## 2016-06-03 ENCOUNTER — Inpatient Hospital Stay (HOSPITAL_COMMUNITY): Payer: 59 | Admitting: Anesthesiology

## 2016-06-03 ENCOUNTER — Encounter (HOSPITAL_COMMUNITY): Payer: Self-pay | Admitting: *Deleted

## 2016-06-03 ENCOUNTER — Encounter (HOSPITAL_COMMUNITY)
Admission: EM | Disposition: A | Discharge status: Home / Self Care | Payer: Self-pay | Source: Home / Self Care | Attending: Internal Medicine

## 2016-06-03 ENCOUNTER — Inpatient Hospital Stay (HOSPITAL_COMMUNITY): Payer: 59

## 2016-06-03 DIAGNOSIS — F411 Generalized anxiety disorder: Secondary | ICD-10-CM

## 2016-06-03 DIAGNOSIS — K8067 Calculus of gallbladder and bile duct with acute and chronic cholecystitis with obstruction: Secondary | ICD-10-CM

## 2016-06-03 DIAGNOSIS — R7989 Other specified abnormal findings of blood chemistry: Secondary | ICD-10-CM

## 2016-06-03 DIAGNOSIS — K8050 Calculus of bile duct without cholangitis or cholecystitis without obstruction: Secondary | ICD-10-CM

## 2016-06-03 DIAGNOSIS — K810 Acute cholecystitis: Secondary | ICD-10-CM

## 2016-06-03 HISTORY — PX: CHOLECYSTECTOMY: SHX55

## 2016-06-03 LAB — COMPREHENSIVE METABOLIC PANEL
ALK PHOS: 98 U/L (ref 38–126)
ALT: 170 U/L — ABNORMAL HIGH (ref 14–54)
ANION GAP: 7 (ref 5–15)
AST: 187 U/L — ABNORMAL HIGH (ref 15–41)
Albumin: 3.6 g/dL (ref 3.5–5.0)
BILIRUBIN TOTAL: 1.2 mg/dL (ref 0.3–1.2)
BUN: 7 mg/dL (ref 6–20)
CALCIUM: 8.4 mg/dL — AB (ref 8.9–10.3)
CO2: 24 mmol/L (ref 22–32)
Chloride: 104 mmol/L (ref 101–111)
Creatinine, Ser: 0.72 mg/dL (ref 0.44–1.00)
Glucose, Bld: 99 mg/dL (ref 65–99)
Potassium: 4.1 mmol/L (ref 3.5–5.1)
Sodium: 135 mmol/L (ref 135–145)
TOTAL PROTEIN: 6.5 g/dL (ref 6.5–8.1)

## 2016-06-03 LAB — CBC
HCT: 37.6 % (ref 36.0–46.0)
Hemoglobin: 12.3 g/dL (ref 12.0–15.0)
MCH: 31.5 pg (ref 26.0–34.0)
MCHC: 32.7 g/dL (ref 30.0–36.0)
MCV: 96.4 fL (ref 78.0–100.0)
PLATELETS: 329 10*3/uL (ref 150–400)
RBC: 3.9 MIL/uL (ref 3.87–5.11)
RDW: 12.9 % (ref 11.5–15.5)
WBC: 7.6 10*3/uL (ref 4.0–10.5)

## 2016-06-03 LAB — HEPATIC FUNCTION PANEL
ALK PHOS: 123 U/L (ref 38–126)
ALT: 388 U/L — AB (ref 14–54)
AST: 421 U/L — AB (ref 15–41)
Albumin: 3.6 g/dL (ref 3.5–5.0)
BILIRUBIN DIRECT: 0.6 mg/dL — AB (ref 0.1–0.5)
Indirect Bilirubin: 0.6 mg/dL (ref 0.3–0.9)
Total Bilirubin: 1.2 mg/dL (ref 0.3–1.2)
Total Protein: 6.6 g/dL (ref 6.5–8.1)

## 2016-06-03 SURGERY — LAPAROSCOPIC CHOLECYSTECTOMY WITH INTRAOPERATIVE CHOLANGIOGRAM
Anesthesia: General

## 2016-06-03 MED ORDER — FENTANYL CITRATE (PF) 100 MCG/2ML IJ SOLN
INTRAMUSCULAR | Status: DC | PRN
  Administered 2016-06-03 (×3): 50 ug via INTRAVENOUS
  Administered 2016-06-03: 25 ug via INTRAVENOUS
  Administered 2016-06-03 (×2): 50 ug via INTRAVENOUS
  Administered 2016-06-03: 25 ug via INTRAVENOUS

## 2016-06-03 MED ORDER — IOPAMIDOL (ISOVUE-300) INJECTION 61%
INTRAVENOUS | Status: AC
  Filled 2016-06-03: qty 50

## 2016-06-03 MED ORDER — ONDANSETRON HCL 4 MG/2ML IJ SOLN
4.0000 mg | Freq: Once | INTRAMUSCULAR | Status: AC
  Administered 2016-06-03: 4 mg via INTRAVENOUS

## 2016-06-03 MED ORDER — SUCCINYLCHOLINE CHLORIDE 20 MG/ML IJ SOLN
INTRAMUSCULAR | Status: DC | PRN
  Administered 2016-06-03: 120 mg via INTRAVENOUS

## 2016-06-03 MED ORDER — ARTIFICIAL TEARS OP OINT
TOPICAL_OINTMENT | OPHTHALMIC | Status: AC
  Filled 2016-06-03: qty 3.5

## 2016-06-03 MED ORDER — MIDAZOLAM HCL 2 MG/2ML IJ SOLN
INTRAMUSCULAR | Status: AC
  Filled 2016-06-03: qty 2

## 2016-06-03 MED ORDER — LACTATED RINGERS IV SOLN
INTRAVENOUS | Status: DC
  Administered 2016-06-03 (×2): via INTRAVENOUS

## 2016-06-03 MED ORDER — NEOSTIGMINE METHYLSULFATE 10 MG/10ML IV SOLN
INTRAVENOUS | Status: DC | PRN
  Administered 2016-06-03: 4 mg via INTRAVENOUS

## 2016-06-03 MED ORDER — PROPOFOL 10 MG/ML IV BOLUS
INTRAVENOUS | Status: AC
  Filled 2016-06-03: qty 20

## 2016-06-03 MED ORDER — LIDOCAINE HCL 1 % IJ SOLN
INTRAMUSCULAR | Status: DC | PRN
  Administered 2016-06-03: 18 mL

## 2016-06-03 MED ORDER — HYDROMORPHONE HCL 1 MG/ML IJ SOLN
0.5000 mg | INTRAMUSCULAR | Status: DC | PRN
  Administered 2016-06-03 (×2): 0.5 mg via INTRAVENOUS

## 2016-06-03 MED ORDER — GLYCOPYRROLATE 0.2 MG/ML IJ SOLN
INTRAMUSCULAR | Status: AC
  Filled 2016-06-03: qty 3

## 2016-06-03 MED ORDER — ONDANSETRON HCL 4 MG/2ML IJ SOLN
INTRAMUSCULAR | Status: AC
  Filled 2016-06-03: qty 2

## 2016-06-03 MED ORDER — CEFAZOLIN SODIUM-DEXTROSE 2-3 GM-% IV SOLR
INTRAVENOUS | Status: DC | PRN
Start: 2016-06-03 — End: 2016-06-03
  Administered 2016-06-03: 2 g via INTRAVENOUS

## 2016-06-03 MED ORDER — HYDROMORPHONE HCL 1 MG/ML IJ SOLN
1.0000 mg | INTRAMUSCULAR | Status: AC | PRN
  Administered 2016-06-03 (×2): 1 mg via INTRAVENOUS
  Filled 2016-06-03 (×2): qty 1

## 2016-06-03 MED ORDER — LIDOCAINE HCL (PF) 1 % IJ SOLN
INTRAMUSCULAR | Status: AC
  Filled 2016-06-03: qty 30

## 2016-06-03 MED ORDER — FENTANYL CITRATE (PF) 100 MCG/2ML IJ SOLN
INTRAMUSCULAR | Status: AC
  Filled 2016-06-03: qty 2

## 2016-06-03 MED ORDER — 0.9 % SODIUM CHLORIDE (POUR BTL) OPTIME
TOPICAL | Status: DC | PRN
  Administered 2016-06-03: 1000 mL

## 2016-06-03 MED ORDER — GLYCOPYRROLATE 0.2 MG/ML IJ SOLN
INTRAMUSCULAR | Status: DC | PRN
  Administered 2016-06-03: 0.6 mg via INTRAVENOUS

## 2016-06-03 MED ORDER — ROCURONIUM BROMIDE 50 MG/5ML IV SOLN
INTRAVENOUS | Status: AC
Start: 2016-06-03 — End: 2016-06-03
  Filled 2016-06-03: qty 1

## 2016-06-03 MED ORDER — PROPOFOL 10 MG/ML IV BOLUS
INTRAVENOUS | Status: DC | PRN
  Administered 2016-06-03: 150 mg via INTRAVENOUS
  Administered 2016-06-03: 40 mg via INTRAVENOUS
  Administered 2016-06-03: 50 mg via INTRAVENOUS

## 2016-06-03 MED ORDER — OXYCODONE-ACETAMINOPHEN 5-325 MG PO TABS
1.0000 | ORAL_TABLET | ORAL | Status: DC | PRN
  Administered 2016-06-03 – 2016-06-04 (×4): 2 via ORAL
  Filled 2016-06-03 (×4): qty 2

## 2016-06-03 MED ORDER — BUPIVACAINE HCL (PF) 0.5 % IJ SOLN
INTRAMUSCULAR | Status: AC
Start: 2016-06-03 — End: 2016-06-03
  Filled 2016-06-03: qty 30

## 2016-06-03 MED ORDER — MIDAZOLAM HCL 2 MG/2ML IJ SOLN
1.0000 mg | INTRAMUSCULAR | Status: AC
  Administered 2016-06-03 (×2): 2 mg via INTRAVENOUS
  Filled 2016-06-03: qty 2

## 2016-06-03 MED ORDER — FENTANYL CITRATE (PF) 250 MCG/5ML IJ SOLN
INTRAMUSCULAR | Status: AC
  Filled 2016-06-03: qty 5

## 2016-06-03 MED ORDER — HEMOSTATIC AGENTS (NO CHARGE) OPTIME
TOPICAL | Status: DC | PRN
  Administered 2016-06-03: 1 via TOPICAL

## 2016-06-03 MED ORDER — HYDROMORPHONE HCL 1 MG/ML IJ SOLN
0.2500 mg | INTRAMUSCULAR | Status: DC | PRN
  Administered 2016-06-03 (×4): 0.5 mg via INTRAVENOUS
  Filled 2016-06-03 (×3): qty 1

## 2016-06-03 MED ORDER — NEOSTIGMINE METHYLSULFATE 10 MG/10ML IV SOLN
INTRAVENOUS | Status: AC
  Filled 2016-06-03: qty 1

## 2016-06-03 MED ORDER — LIDOCAINE HCL (CARDIAC) 20 MG/ML IV SOLN
INTRAVENOUS | Status: DC | PRN
  Administered 2016-06-03: 40 mg via INTRAVENOUS

## 2016-06-03 MED ORDER — CEFAZOLIN SODIUM-DEXTROSE 2-4 GM/100ML-% IV SOLN
INTRAVENOUS | Status: AC
  Filled 2016-06-03: qty 100

## 2016-06-03 MED ORDER — ROCURONIUM BROMIDE 100 MG/10ML IV SOLN
INTRAVENOUS | Status: DC | PRN
  Administered 2016-06-03: 20 mg via INTRAVENOUS
  Administered 2016-06-03: 10 mg via INTRAVENOUS

## 2016-06-03 SURGICAL SUPPLY — 49 items
ADH SKN CLS APL DERMABOND .7 (GAUZE/BANDAGES/DRESSINGS) ×1
APPLIER CLIP LAPSCP 10X32 DD (CLIP) ×2 IMPLANT
BAG HAMPER (MISCELLANEOUS) ×2 IMPLANT
BAG RETRIEVAL 10 (BASKET) ×1
CATH CHOLANGIOGRAM 4.5FR (CATHETERS) ×1 IMPLANT
CHLORAPREP W/TINT 26ML (MISCELLANEOUS) ×2 IMPLANT
CLOTH BEACON ORANGE TIMEOUT ST (SAFETY) ×2 IMPLANT
COVER LIGHT HANDLE STERIS (MISCELLANEOUS) ×2 IMPLANT
DECANTER SPIKE VIAL GLASS SM (MISCELLANEOUS) ×2 IMPLANT
DERMABOND ADVANCED (GAUZE/BANDAGES/DRESSINGS) ×1
DERMABOND ADVANCED .7 DNX12 (GAUZE/BANDAGES/DRESSINGS) ×1 IMPLANT
DEVICE TROCAR PUNCTURE CLOSURE (ENDOMECHANICALS) ×2 IMPLANT
ELECT REM PT RETURN 9FT ADLT (ELECTROSURGICAL) ×2
ELECTRODE REM PT RTRN 9FT ADLT (ELECTROSURGICAL) ×1 IMPLANT
FILTER SMOKE EVAC LAPAROSHD (FILTER) ×2 IMPLANT
FORMALIN 10 PREFIL 120ML (MISCELLANEOUS) ×2 IMPLANT
GLOVE BIOGEL PI IND STRL 7.0 (GLOVE) ×2 IMPLANT
GLOVE BIOGEL PI IND STRL 7.5 (GLOVE) ×1 IMPLANT
GLOVE BIOGEL PI INDICATOR 7.0 (GLOVE) ×2
GLOVE BIOGEL PI INDICATOR 7.5 (GLOVE) ×1
GLOVE ECLIPSE 7.0 STRL STRAW (GLOVE) ×2 IMPLANT
GOWN STRL REUS W/ TWL XL LVL3 (GOWN DISPOSABLE) ×1 IMPLANT
GOWN STRL REUS W/TWL LRG LVL3 (GOWN DISPOSABLE) ×2 IMPLANT
GOWN STRL REUS W/TWL XL LVL3 (GOWN DISPOSABLE) ×2
HEMOSTAT SNOW SURGICEL 2X4 (HEMOSTASIS) ×2 IMPLANT
INST SET LAPROSCOPIC AP (KITS) ×2 IMPLANT
IV NS IRRIG 3000ML ARTHROMATIC (IV SOLUTION) ×1 IMPLANT
KIT ROOM TURNOVER APOR (KITS) ×2 IMPLANT
MANIFOLD NEPTUNE II (INSTRUMENTS) ×2 IMPLANT
NDL INSUFFLATION 14GA 120MM (NEEDLE) ×1 IMPLANT
NEEDLE INSUFFLATION 14GA 120MM (NEEDLE) ×2 IMPLANT
NS IRRIG 1000ML POUR BTL (IV SOLUTION) ×2 IMPLANT
PACK LAP CHOLE LZT030E (CUSTOM PROCEDURE TRAY) ×2 IMPLANT
PAD ARMBOARD 7.5X6 YLW CONV (MISCELLANEOUS) ×2 IMPLANT
SET BASIN LINEN APH (SET/KITS/TRAYS/PACK) ×2 IMPLANT
SET TUBE IRRIG SUCTION NO TIP (IRRIGATION / IRRIGATOR) ×1 IMPLANT
SLEEVE ENDOPATH XCEL 5M (ENDOMECHANICALS) ×2 IMPLANT
SUT MNCRL AB 4-0 PS2 18 (SUTURE) ×2 IMPLANT
SUT VICRYL 0 UR6 27IN ABS (SUTURE) ×2 IMPLANT
SUT VICRYL AB 3-0 FS1 BRD 27IN (SUTURE) ×2 IMPLANT
SYR 20CC LL (SYRINGE) ×1 IMPLANT
SYRINGE 10CC LL (SYRINGE) ×2 IMPLANT
SYS BAG RETRIEVAL 10MM (BASKET) ×1
SYSTEM BAG RETRIEVAL 10MM (BASKET) ×1 IMPLANT
TROCAR ENDO BLADELESS 11MM (ENDOMECHANICALS) ×2 IMPLANT
TROCAR XCEL NON-BLD 5MMX100MML (ENDOMECHANICALS) ×2 IMPLANT
TUBE CONNECTING 12X1/4 (SUCTIONS) ×2 IMPLANT
TUBING INSUFFLATION (TUBING) ×2 IMPLANT
WARMER LAPAROSCOPE (MISCELLANEOUS) ×2 IMPLANT

## 2016-06-03 NOTE — Anesthesia Procedure Notes (Signed)
Procedure Name: Intubation Date/Time: 06/03/2016 10:51 AM Performed by: Pernell Dupre, AMY A Pre-anesthesia Checklist: Patient identified, Patient being monitored, Timeout performed, Emergency Drugs available and Suction available Patient Re-evaluated:Patient Re-evaluated prior to inductionOxygen Delivery Method: Circle System Utilized Preoxygenation: Pre-oxygenation with 100% oxygen Intubation Type: IV induction, Rapid sequence and Cricoid Pressure applied Laryngoscope Size: Miller and 3 Grade View: Grade I Tube type: Oral Tube size: 7.0 mm Number of attempts: 1 Airway Equipment and Method: Stylet Placement Confirmation: ETT inserted through vocal cords under direct vision,  positive ETCO2 and breath sounds checked- equal and bilateral Secured at: 21 cm Tube secured with: Tape Dental Injury: Teeth and Oropharynx as per pre-operative assessment

## 2016-06-03 NOTE — Care Management Note (Signed)
Case Management Note  Patient Details  Name: Kathleen Stewart MRN: [id number] Date of Birth: 01/19/1976  Subjective/Objective:                  Pt admitted with choledocholithiasis now s/p lap chole. Chart reviewed for CM needs. Pt is from home, lives with family and is ind with ADL's. She has PCP, transportation to appointments and insurance with drug coverage. Family at bedside.   Action/Plan: Plan for return home with self care.   Expected Discharge Date:       06/04/2016           Expected Discharge Plan:  Home/Self Care  In-House Referral:  NA  Discharge planning Services  CM Consult  Post Acute Care Choice:  NA Choice offered to:  NA  Status of Service:  Completed, signed off  Malcolm Metro, RN 06/03/2016, 3:32 PM

## 2016-06-03 NOTE — Progress Notes (Signed)
PROGRESS NOTE    Kathleen Stewart  ZOX:[id number] DOB: 13-Jun-1975 DOA: 06/02/2016 PCP: Dwana Melena, MD    Brief Narrative:  Kathleen Stewart is a 41 y.o. female with medical history of GERD, anxiety/depression, irritable bowel syndrome presented with acute onset of epigastric and right upper quadrant abdominal pain around 9 PM on 06/01/2016 assosciated with nausea. The patient was recently discharged from the hospital on 05/27/2016 after being treated for choledocholithiasis. During that hospitalization, MRCP confirmed a single stone in the common bile duct. ERCP was performed by Dr. Lisbeth Renshaw.  ERCP did not reveal any stones, but showed biliary papillary stenosis. Sphincterotomy was performed and a biliary stent was placed. Cholangiogram did not reveal any residual filling defects. The patient was discharged in stable condition after her diet was advanced. She had an appointment on 06/23/2016 with Dr. Franky Macho to follow up for cholecystectomy. Unfortunately, the patient developed acute onset of abdominal pain after eating pizza at 5:30 on 06/01/2016   In the emergency department, the patient was afebrile and hemodynamically stable saturating 97% on room air. CT of the abdomen and pelvis showed a 4 mm stone in the common bile duct at the head of the pancreas that was a little increased in size versus previous CTs. There is moderate gallbladder wall thickening. AST 164, ALT 97, alkaline phosphatase 101, total bilirubin 1.0. WBC was 11.6. Gastroenterology was consulted. Gen surgery was consulted   Assessment & Plan:   Active Problems:   Anxiety state   Irritable bowel syndrome   DIARRHEA, CHRONIC   Choledocholithiasis   Elevated LFTs   Tobacco abuse   Patient was admitted to the hospital and underwent repeat ERCP. She was found to have biliary papillary stenosis. Biliary sphincterotomy was performed. She was seen by general surgery and underwent laparoscopic cholecystectomy on 4/4.  Intraoperative cholangiogram did not show any residual stones. Follow-up LFTs after surgery did note a bump in transaminases, with normal alkaline phosphatase and unchanged bilirubin. Diet is being advanced. She will be transitioned to oral pain medications. Will repeat labs in the morning. If stable and she is tolerating diet, can likely discharge home on 4/5.   DVT prophylaxis: lovenox Code Status: full  Family Communication: discussed with family at bedside Disposition Plan: discharge home once improved   Consultants:   Gastroenterology  General surgery  Procedures:  4/3 ERCP: Biliary papillary stenosis, benign. - A biliary sphincterotomy was performed.  - The biliary tree was swept and nothing was found.  4/4 1)Laparoscopic Cholecystectomy 2.) Intra-operative Cholangiogram  Antimicrobials:   Rocephin 4/3>>   Subjective: Seen in room post operatively. Complaining of abdominal pain  Objective: Vitals:   06/03/16 1445 06/03/16 1458 06/03/16 1500 06/03/16 1513  BP:  108/62  (!) 102/49  Pulse: 83 96  74  Resp: 20     Temp:    98.5 F (36.9 C)  TempSrc:    Oral  SpO2: 95% 96% 96% 97%  Weight:      Height:        Intake/Output Summary (Last 24 hours) at 06/03/16 1848 Last data filed at 06/03/16 1700  Gross per 24 hour  Intake          2446.25 ml  Output              810 ml  Net          1636.25 ml   Filed Weights   06/02/16 0306 06/02/16 1301 06/03/16 0851  Weight: 86.2 kg (190 lb)  85.3 kg (188 lb 1.6 oz) 85.3 kg (188 lb)    Examination:  General exam: Appears calm and comfortable  Respiratory system: Clear to auscultation. Respiratory effort normal. Cardiovascular system: S1 & S2 heard, RRR. No JVD, murmurs, rubs, gallops or clicks. No pedal edema. Gastrointestinal system: Abdomen is nondistended, soft and diffusely tender. No organomegaly or masses felt. Normal bowel sounds heard. Central nervous system: Alert and oriented. No focal neurological  deficits. Extremities: Symmetric 5 x 5 power. Skin: No rashes, lesions or ulcers Psychiatry: Judgement and insight appear normal. Mood & affect appropriate.     Data Reviewed: I have personally reviewed following labs and imaging studies  CBC:  Recent Labs Lab 06/02/16 0337 06/03/16 0609  WBC 11.6* 7.6  NEUTROABS 9.6*  --   HGB 13.4 12.3  HCT 39.5 37.6  MCV 93.4 96.4  PLT 355 329   Basic Metabolic Panel:  Recent Labs Lab 06/02/16 0337 06/03/16 0609  NA 134* 135  K 3.8 4.1  CL 101 104  CO2 25 24  GLUCOSE 130* 99  BUN 10 7  CREATININE 0.93 0.72  CALCIUM 9.1 8.4*   GFR: Estimated Creatinine Clearance: 97.7 mL/min (by C-G formula based on SCr of 0.72 mg/dL). Liver Function Tests:  Recent Labs Lab 06/02/16 0337 06/03/16 0609 06/03/16 1655  AST 164* 187* 421*  ALT 97* 170* 388*  ALKPHOS 101 98 123  BILITOT 1.0 1.2 1.2  PROT 7.0 6.5 6.6  ALBUMIN 4.1 3.6 3.6    Recent Labs Lab 06/02/16 0337  LIPASE 21   No results for input(s): AMMONIA in the last 168 hours. Coagulation Profile: No results for input(s): INR, PROTIME in the last 168 hours. Cardiac Enzymes: No results for input(s): CKTOTAL, CKMB, CKMBINDEX, TROPONINI in the last 168 hours. BNP (last 3 results) No results for input(s): PROBNP in the last 8760 hours. HbA1C: No results for input(s): HGBA1C in the last 72 hours. CBG: No results for input(s): GLUCAP in the last 168 hours. Lipid Profile: No results for input(s): CHOL, HDL, LDLCALC, TRIG, CHOLHDL, LDLDIRECT in the last 72 hours. Thyroid Function Tests: No results for input(s): TSH, T4TOTAL, FREET4, T3FREE, THYROIDAB in the last 72 hours. Anemia Panel: No results for input(s): VITAMINB12, FOLATE, FERRITIN, TIBC, IRON, RETICCTPCT in the last 72 hours. Sepsis Labs: No results for input(s): PROCALCITON, LATICACIDVEN in the last 168 hours.  Recent Results (from the past 240 hour(s))  Surgical pcr screen     Status: None   Collection Time:  05/25/16  8:03 PM  Result Value Ref Range Status   MRSA, PCR NEGATIVE NEGATIVE Final   Staphylococcus aureus NEGATIVE NEGATIVE Final    Comment:        The Xpert SA Assay (FDA approved for NASAL specimens in patients over 72 years of age), is one component of a comprehensive surveillance program.  Test performance has been validated by [facility] for patients greater than or equal to 53 year old. It is not intended to diagnose infection nor to guide or monitor treatment.          Radiology Studies: Dg Cholangiogram Operative  Result Date: 06/03/2016 CLINICAL DATA:  Cholecystectomy for cholecystitis. EXAM: INTRAOPERATIVE CHOLANGIOGRAM TECHNIQUE: Cholangiographic images from the C-arm fluoroscopic device were submitted for interpretation post-operatively. Please see the procedural report for the amount of contrast and the fluoroscopy time utilized. COMPARISON:  CT of the abdomen on 06/02/2016 as well as imaging during ERCP on 06/02/2016. FINDINGS: Images obtained with a C-arm intraoperatively demonstrate normally patent  opacified common bile duct without evidence of filling defect. Contrast enters the duodenum. No contrast extravasation identified. IMPRESSION: Normal intraoperative cholangiogram. Electronically Signed   By: Irish Lack M.D.   On: 06/03/2016 15:35   Ct Abdomen Pelvis W Contrast  Result Date: 06/02/2016 CLINICAL DATA:  41 y/o F; worsening abdominal pain and increasing liver function tests. Bile ducts stent placed last week. EXAM: CT ABDOMEN AND PELVIS WITH CONTRAST TECHNIQUE: Multidetector CT imaging of the abdomen and pelvis was performed using the standard protocol following bolus administration of intravenous contrast. CONTRAST:  ISOVUE-300 IOPAMIDOL (ISOVUE-300) INJECTION 61% COMPARISON:  05/26/2016 MRI of the abdomen and 05/25/2016 CT of the abdomen and pelvis. FINDINGS: Lower chest: No acute abnormality. Hepatobiliary: No focal liver lesion. No intra or  extrahepatic biliary ductal dilatation. 3 mm gallstones are present within the gallbladder fundus. Moderate diffuse gallbladder wall thickening increased from prior CT. 4 mm stone within the lower common bile duct. No biliary stent identified. Pancreas: Unremarkable. No pancreatic ductal dilatation or surrounding inflammatory changes. Spleen: Normal in size without focal abnormality. Adrenals/Urinary Tract: Adrenal glands are unremarkable. Kidneys are normal, without renal calculi, focal lesion, or hydronephrosis. Bladder is unremarkable. Stomach/Bowel: Stomach is within normal limits. Appendix appears normal. No evidence of bowel wall thickening, distention, or inflammatory changes. Vascular/Lymphatic: Aortic atherosclerosis with mild calcification. No enlarged abdominal or pelvic lymph nodes. Reproductive: Uterus and bilateral adnexa are unremarkable. Other: No abdominal wall hernia or abnormality. No abdominopelvic ascites. Musculoskeletal: Stable grade 1 L5-S1 anterolisthesis and chronic pars defects. With IMPRESSION: 1. 4 mm stone within the lower common bile duct in head of pancreas. This stone appears increased in size in comparison with the prior stone and probably represents passage of a new stone. 2. Moderate gallbladder wall thickening increased from prior CT may represent developing acute cholecystitis. 3. No bile duct stent identified. 4. Additional 2-3 mm gallstones are present within the gallbladder fundus. Electronically Signed   By: Mitzi Hansen M.D.   On: 06/02/2016 06:13   Dg Ercp Biliary & Pancreatic Ducts  Result Date: 06/02/2016 CLINICAL DATA:  Choledocholithiasis by CT EXAM: ERCP with sphincterotomy TECHNIQUE: Multiple spot images obtained with the fluoroscopic device and submitted for interpretation post-procedure. FLUOROSCOPY TIME:  Fluoroscopy Time:  7 minutes 46 seconds COMPARISON:  06/02/2016 CT FINDINGS: Limited cholangiogram during the procedure. This demonstrates  cannulation of the bile duct and retrograde cholangiogram. Sphincterotomy performed. Retrograde injection demonstrates no biliary obstruction or dilatation. Common bile duct is normal in caliber. Mobile filling defects are most consistent with injected air bubbles. Balloon sweep performed of the bile duct. Please refer to the ERCP note for further details of the procedure. IMPRESSION: Limited imaging during ERCP as above. No biliary obstruction or significant dilatation. These images were submitted for radiologic interpretation only. Please see the procedural report for the amount of contrast and the fluoroscopy time utilized. Electronically Signed   By: Judie Petit.  Shick M.D.   On: 06/02/2016 11:50        Scheduled Meds: . ALPRAZolam  1 mg Oral QHS  . cefTRIAXone (ROCEPHIN)  IV  1 g Intravenous Q24H  . enoxaparin (LOVENOX) injection  40 mg Subcutaneous Q24H  . escitalopram  10 mg Oral QHS  . nicotine  21 mg Transdermal Daily  . pantoprazole (PROTONIX) IV  40 mg Intravenous Q24H   Continuous Infusions: . 0.9 % NaCl with KCl 20 mEq / L 75 mL/hr at 06/03/16 1545     LOS: 1 day    Time spent:     Island Dohmen, MD [facility] Pager [phone]  If 7PM-7AM, please contact night-coverage [email or web address] Password [facility] 06/03/2016, 6:48 PM

## 2016-06-03 NOTE — Anesthesia Preprocedure Evaluation (Signed)
Anesthesia Evaluation  Patient identified by MRN, date of birth, ID band Patient awake    Reviewed: Allergy & Precautions, NPO status , Patient's Chart, lab work & pertinent test results  Airway Mallampati: I  TM Distance: >3 FB     Dental  (+) Teeth Intact, Partial Lower, Partial Upper   Pulmonary Current Smoker,    breath sounds clear to auscultation       Cardiovascular negative cardio ROS   Rhythm:Regular Rate:Normal     Neuro/Psych PSYCHIATRIC DISORDERS Anxiety Depression    GI/Hepatic GERD  Medicated and Controlled,  Endo/Other    Renal/GU      Musculoskeletal Chronic LBP   Abdominal   Peds  Hematology   Anesthesia Other Findings   Reproductive/Obstetrics                             Anesthesia Physical Anesthesia Plan  ASA: II  Anesthesia Plan: General   Post-op Pain Management:    Induction: Intravenous, Rapid sequence and Cricoid pressure planned  Airway Management Planned: Oral ETT  Additional Equipment:   Intra-op Plan:   Post-operative Plan: Extubation in OR  Informed Consent: I have reviewed the patients History and Physical, chart, labs and discussed the procedure including the risks, benefits and alternatives for the proposed anesthesia with the patient or authorized representative who has indicated his/her understanding and acceptance.     Plan Discussed with:   Anesthesia Plan Comments:         Anesthesia Quick Evaluation  

## 2016-06-03 NOTE — Progress Notes (Signed)
Patient seen and examined, no changes since consultation documented yesterday.  -- Scherrie Gerlach Earlene Plater, MD, RPVI [place]: [facility] Office #: [phone]

## 2016-06-03 NOTE — Anesthesia Postprocedure Evaluation (Signed)
Anesthesia Post Note  Patient: Kathleen Stewart  Procedure(s) Performed: Procedure(s) (LRB): LAPAROSCOPIC CHOLECYSTECTOMY WITH INTRAOPERATIVE CHOLANGIOGRAM (N/A)  Patient location during evaluation: PACU Anesthesia Type: General Level of consciousness: awake and alert and oriented Pain management: pain level controlled Vital Signs Assessment: post-procedure vital signs reviewed and stable Respiratory status: spontaneous breathing Cardiovascular status: stable Postop Assessment: no signs of nausea or vomiting Anesthetic complications: no     Last Vitals:  Vitals:   06/03/16 1000 06/03/16 1243  BP:  (!) 112/58  Pulse:  84  Resp: 20 13  Temp:  36.9 C    Last Pain:  Vitals:   06/03/16 0851  TempSrc: Oral  PainSc: 5                  [name] A

## 2016-06-03 NOTE — Op Note (Signed)
SURGICAL OPERATIVE REPORT   DATE OF PROCEDURE: 06/03/2016  ATTENDING Surgeon(s): Ancil Linsey, MD  ANESTHESIA: GETA  PRE-OPERATIVE DIAGNOSIS: Acute on chronic cholecystitis with choledocholithiasis (K80.67)  POST-OPERATIVE DIAGNOSIS: Acute on chronic cholecystitis with choledocholithiasis (K80.67)  PROCEDURE(S): (cpt's: 96045) 1.) Laparoscopic Cholecystectomy 2.) Intra-operative Cholangiogram (40981)  INTRAOPERATIVE FINDINGS: Moderate pericholecystic inflammation with short patent cystic duct and long patent common bile duct with smooth tapering becoming narrowed at distal common bile duct without evidence of any residual filling defect to suggest presence of gallstone(s)  INTRAOPERATIVE FLUIDS: 1300 mL crystalloid   ESTIMATED BLOOD LOSS: Minimal (<30 mL)   URINE OUTPUT: No foley  SPECIMENS: Gallbladder  IMPLANTS: None  DRAINS: None   COMPLICATIONS: None apparent   CONDITION AT COMPLETION: Hemodynamically stable and extubated  DISPOSITION: PACU   INDICATION(S) FOR PROCEDURE:  Patient is a 41 y.o. female, recently discharged (05/27/2016) following ERCP (05/26/2016) for choledocholithiasis with outpatient surgical follow-up re-presented yesterday to [facility] ED with recurrence of RUQ abdominal after eating pizza. Her first recent presentation was for RUQ abdominal pain x 36 hours with mildly elevated bilirubin and transaminases with CT and MRCP demonstrating choledocholithiasis and mild cholecystitis. Patient underwent repeat ERCP with sphincterotomy yesterday and has been tolerating clear liquids diet with only mild persistent pain and without N/V or fever/chills. All risks, benefits, and alternatives to above elective procedures were discussed with the patient, who elected to proceed, and informed consent was accordingly obtained at that time.   DETAILS OF PROCEDURE:  Patient was brought to the operating suite and appropriately identified. General anesthesia was administered  along with peri-operative prophylactic IV antibiotics, and endotracheal intubation was performed by anesthesiologist, along with NG/OG tube for gastric decompression. In supine position, operative site was prepped and draped in usual sterile fashion, and following a brief time out, initial 5 mm incision was made in a natural skin crease just above the umbilicus. Fascia was then elevated, and a Verress needle was inserted and its proper position confirmed using aspiration and saline meniscus test.  Upon insufflation of the abdominal cavity with carbon dioxide to a well-tolerated pressure of 12-15 mmHg, 5 mm peri-umbilical port followed by laparoscope were inserted and used to inspect the abdominal cavity and its contents with no injuries from insertion of the first trochar noted. Three additional trocars were inserted, one at the epigastric position (10 mm) and two along the Right costal margin (5 mm). The table was then placed in reverse Trendelenburg position with the Right side up. Filmy adhesions between the gallbladder and omentum/duodenum/transverse colon were lysed using combined blunt and sharp dissection. The apex/dome of the gallbladder was grasped with an atraumatic grasper passed through the lateral port and retracted apically over the liver. The infundibulum was also grasped and retracted, exposing Calot's triangle. The peritoneum overlying the gallbladder infundibulum was incised and dissected free of surrounding peritoneal attachments, revealing the cystic duct and cystic artery. The cystic duct was clipped once on the gallbladder specimen side close to the gallbladder. Cystic ductotomy was then made using endoshear scissors, and cholangiocatheter was inserted into the cystic duct, through which cholangiogram was performed, revealing a short patent cystic duct and a long patent common bile duct with smooth tapering becoming narrowed at the distal common bile duct without evidence of any residual  filling defect to suggest presence of gallstone(s). The cholangiocatheter was then removed, and the cystic duct was clipped twice on the patient side of the ductotomy, and the cystic artery was clipped twice on the patient side  and once on the gallbladder specimen side. The gallbladder was then dissected from its peritoneal attachments to the liver using electrocautery, and the gallbladder was placed into a laparoscopic specimen bag and removed from the abdominal cavity via the epigastric port site. Hemostasis and secure placement of clips were confirmed, and intra-peritoneal cavity was inspected with no additional findings. Endoclose laparoscopic fascial closure device was then used to re-approximate fascia at the 10 mm epigastric port site.  All ports were then removed under direct visualization, and abdominal cavity was desuflated. All port sites were irrigated/cleaned, additional local anesthetic was injected at each incision, 3-0 Vicryl was used to re-approximate dermis at 10 mm port site(s), and subcuticular 4-0 Monocryl suture was used to re-approximate skin. Skin was then cleaned, dried, and sterile skin glue was applied. Patient was then safely able to be awakened, extubated, and transferred to PACU for post-operative monitoring and care.   I was present for all aspects of procedure, and there were no intra-operative complications apparent.

## 2016-06-03 NOTE — Transfer of Care (Signed)
Immediate Anesthesia Transfer of Care Note  Patient: Kathleen Stewart  Procedure(s) Performed: Procedure(s): LAPAROSCOPIC CHOLECYSTECTOMY WITH INTRAOPERATIVE CHOLANGIOGRAM (N/A)  Patient Location: PACU  Anesthesia Type:General  Level of Consciousness: awake, alert , oriented and patient cooperative  Airway & Oxygen Therapy: Patient Spontanous Breathing and Patient connected to nasal cannula oxygen  Post-op Assessment: Report given to RN and Post -op Vital signs reviewed and stable  Post vital signs: Reviewed and stable  Last Vitals:  Vitals:   06/03/16 0935 06/03/16 1000  BP: (!) 104/58   Pulse:    Resp: (!) 28 20  Temp:      Last Pain:  Vitals:   06/03/16 0851  TempSrc: Oral  PainSc: 5       Patients Stated Pain Goal: 5 (06/03/16 0851)  Complications: No apparent anesthesia complications

## 2016-06-04 LAB — HEPATIC FUNCTION PANEL
ALBUMIN: 3.1 g/dL — AB (ref 3.5–5.0)
ALK PHOS: 109 U/L (ref 38–126)
ALT: 257 U/L — AB (ref 14–54)
AST: 180 U/L — AB (ref 15–41)
BILIRUBIN DIRECT: 0.2 mg/dL (ref 0.1–0.5)
BILIRUBIN TOTAL: 0.6 mg/dL (ref 0.3–1.2)
Indirect Bilirubin: 0.4 mg/dL (ref 0.3–0.9)
Total Protein: 5.7 g/dL — ABNORMAL LOW (ref 6.5–8.1)

## 2016-06-04 LAB — LIPASE, BLOOD: Lipase: 15 U/L (ref 11–51)

## 2016-06-04 MED ORDER — OXYCODONE-ACETAMINOPHEN 5-325 MG PO TABS: 1.0000 | ORAL_TABLET | ORAL | 0 refills | Status: DC | PRN

## 2016-06-04 NOTE — Addendum Note (Signed)
Addendum  created 06/04/16 1025 by Despina Hidden, CRNA   Sign clinical note

## 2016-06-04 NOTE — Progress Notes (Signed)
SURGICAL PROGRESS NOTE  Hospital Day(s): 2.   Post op day(s): 1 Day Post-Op.   Interval History: Patient seen and examined, no acute events or new complaints overnight. Patient reports tolerating diet with moderate Right of epigastric incision peri-incisional pain and resolution of her pre-operative RUQ abdominal pain, denies N/V, fever/chills, CP, or SOB.  Review of Systems:  Constitutional: denies fever, chills  HEENT: denies cough or congestion  Respiratory: denies any shortness of breath  Cardiovascular: denies chest pain or palpitations  Gastrointestinal: abdominal pain, N/V, and bowel function as per interval history Genitourinary: denies burning with urination or urinary frequency Musculoskeletal: denies pain, decreased motor or sensation Integumentary: denies any other rashes or skin discolorations Neurological: denies HA or vision/hearing changes   Vital signs in last 24 hours: [min-max] current  Temp:  [98.4 F (36.9 C)-98.8 F (37.1 C)] 98.4 F (36.9 C) (04/05 0544) Pulse Rate:  [63-96] 63 (04/05 0544) Resp:  [12-28] 16 (04/05 0544) BP: (98-116)/(49-68) 102/54 (04/05 0544) SpO2:  [93 %-98 %] 97 % (04/05 0544)     Height:  (162.6 cm) Weight: 85.3 kg (188 lb) BMI (Calculated): 32.3   Intake/Output this shift:  No intake/output data recorded.   Intake/Output last 2 shifts:  @   Physical Exam:  Constitutional: alert, cooperative and no distress  HENT: normocephalic without obvious abnormality  Eyes: PERRL, EOM's grossly intact and symmetric  Neuro: CN II - XII grossly intact and symmetric without deficit  Respiratory: breathing non-labored at rest  Cardiovascular: regular rate and sinus rhythm  Gastrointestinal: soft and non-distended with mild-/moderate- peri-incisional tenderness to the Right of epigastric incision, otherwise NT with incisions well-approximated without erythema or drainage Musculoskeletal: UE and LE FROM, no edema or wounds,  motor and sensation grossly intact, NT   Labs: CBC Latest Ref Rng & Units 06/03/2016 06/02/2016 05/27/2016  WBC 4.0 - 10.5 K/uL 7.6 11.6(H) 7.4  Hemoglobin 12.0 - 15.0 g/dL 16.1 09.6 04.5  Hematocrit 36.0 - 46.0 % 37.6 39.5 36.7  Platelets 150 - 400 K/uL 329 355 264   CMP Latest Ref Rng & Units 06/04/2016 06/03/2016 06/03/2016  Glucose 65 - 99 mg/dL - - 99  BUN 6 - 20 mg/dL - - 7  Creatinine 4.09 - 1.00 mg/dL - - 8.11  Sodium 914 - 145 mmol/L - - 135  Potassium 3.5 - 5.1 mmol/L - - 4.1  Chloride 101 - 111 mmol/L - - 104  CO2 22 - 32 mmol/L - - 24  Calcium 8.9 - 10.3 mg/dL - - 8.4(L)  Total Protein 6.5 - 8.1 g/dL 7.8(G) 6.6 6.5  Total Bilirubin 0.3 - 1.2 mg/dL 0.6 1.2 1.2  Alkaline Phos 38 - 126 U/L 109 123 98  AST 15 - 41 U/L 180(H) 421(H) 187(H)  ALT 14 - 54 U/L 257(H) 388(H) 170(H)    Imaging studies: No new pertinent imaging studies   Assessment/Plan: (ICD-10's: KI53.67) 41 y.o. female doing well 1 Day Post-Op s/p laparoscopic cholecystectomy with intraoperative cholangiogram for acute on chronic cholecystitis with choledocholithiasis vs benign CBD papillary stricture per ERCPs, complicated by pertinent comorbidities including irritable bowel syndrome with chronic diarrhea and chronic abdominal pain, chronic back pain with lumbar discopathy, chronic pelvic pain, generalized anxiety disorder, major depression disorder, insomnia, and GERD.              - pain control prn, minimize narcotics             - advance to heart-healthy diet as per patient              -  agree with discharge planning as per primary medical team             - surgical follow-up 4/17 (two weeks after yesterday's cholecystectomy)             - smoking cessation and ambulation encouraged             - please call if any questions  All of the above findings and recommendations were discussed with the patient and her family, and all of patient's and her family's questions were answered to their expressed  satisfaction.  -- Scherrie Gerlach Earlene Plater, MD, RPVI [place]: [facility] General Surgery and Vascular Care Office: [phone]

## 2016-06-04 NOTE — Progress Notes (Signed)
Patient is to be discharged home and in stable condition. Patient and family present of discharge teaching, both verbalize understanding of follow-up appointments and changes to medication regimen. Patient given signed prescriptions and work note from MD. Patient will be escorted out by staff.  Quita Skye, RN

## 2016-06-04 NOTE — Discharge Summary (Signed)
Physician Discharge Summary  Kathleen Stewart ZOX:[id number] DOB: Nov 15, 1975 DOA: 06/02/2016  PCP: Dwana Melena, MD  Admit date: 06/02/2016 Discharge date: 06/04/2016  Admitted From: home Disposition:  home  Recommendations for Outpatient Follow-up:  1. Follow up with PCP in 1-2 weeks 2. Please obtain BMP/CBC in one week 3. Repeat LFTs on follow up 4. Follow up with general surgery in 2 weeks  Discharge Condition: stable CODE STATUS:full  Diet recommendation: Heart Healthy   Brief/Interim Summary: Kathleen Pucciarelli Williamsis a 41 y.o.femalewith medical history of GERD, anxiety/depression, irritable bowel syndrome presented with acute onset of epigastric and right upper quadrant abdominal pain around 9 PM on 06/01/2016 assosciated with nausea. The patient was recently discharged from the hospital on 05/27/2016 after being treated for choledocholithiasis. During that hospitalization, MRCP confirmed a single stone in the common bile duct. ERCP was performed by Dr. Lisbeth Renshaw. ERCP did not reveal any stones, but showed biliary papillary stenosis. Sphincterotomy was performed and a biliary stent was placed. Cholangiogram did not reveal any residual filling defects. The patient was discharged in stable condition after her diet was advanced. She had anappointment on 06/23/2016 with Dr. Franky Macho to follow up for cholecystectomy. Unfortunately, the patient developed acute onset of abdominal pain after eating pizza at 5:30 on 06/01/2016   In the emergency department, the patient was afebrile and hemodynamically stable saturating 97% on room air. CT of the abdomen and pelvis showed a 4 mm stone in the common bile duct at the head of the pancreas that was a little increased in size versus previous CTs. There is moderate gallbladder wall thickening. AST 164, ALT 97, alkaline phosphatase 101, total bilirubin 1.0. WBC was 11.6. Gastroenterology was consulted. Gen surgery was consulted  Discharge Diagnoses:  Active  Problems:   Anxiety state   Irritable bowel syndrome   DIARRHEA, CHRONIC   Choledocholithiasis   Elevated LFTs   Tobacco abuse  Patient was admitted to the hospital and underwent repeat ERCP. She was found to have biliary papillary stenosis. Biliary sphincterotomy was performed. She was seen by general surgery and underwent laparoscopic cholecystectomy on 4/4. Intraoperative cholangiogram did not show any residual stones. Follow-up LFTs after surgery did note a bump in transaminases, with normal alkaline phosphatase and unchanged bilirubin. This was repeated the following day and showed improvement. Lipase normal. Diet was advanced to heart healthy and she tolerated it well. Patient is stable for discharge home today and will have outpatient follow up with general surgery at which time LFTs can be repeated.  Discharge Instructions  Discharge Instructions    Diet - low sodium heart healthy    Complete by:  As directed    Increase activity slowly    Complete by:  As directed      Allergies as of 06/04/2016      Reactions   Adhesive [tape] Dermatitis   Redness at adhesive sites      Medication List    TAKE these medications   adapalene 0.1 % gel Commonly known as:  DIFFERIN Apply 1 application topically at bedtime.   ALPRAZolam 1 MG tablet Commonly known as:  XANAX Take 1 mg by mouth at bedtime.   BEANO Tabs Take 2 tablets by mouth as needed (gas relief).   Biotin 81191 MCG Tbdp Take 10,000 mcg by mouth daily.   escitalopram 20 MG tablet Commonly known as:  LEXAPRO Take 10 mg by mouth at bedtime.   ibuprofen 200 MG tablet Commonly known as:  ADVIL,MOTRIN Take 400  mg by mouth every 8 (eight) hours as needed (for pain/headaches). LIQUIDGELS   omeprazole 40 MG capsule Commonly known as:  PRILOSEC Take 40 mg by mouth 2 (two) times daily.   ondansetron 4 MG tablet Commonly known as:  ZOFRAN Take 4 mg by mouth every 8 (eight) hours as needed for nausea or vomiting.    oxyCODONE-acetaminophen 5-325 MG tablet Commonly known as:  PERCOCET/ROXICET Take 1 tablet by mouth every 4 (four) hours as needed for moderate pain.   TRUBIOTICS PO Take 1 tablet by mouth daily.      Follow-up Information    Franky Macho, MD. Schedule an appointment as soon as possible for a visit on 06/16/2016.   Specialty:  General Surgery Contact information: [id number] [place] [address] [phone]          Allergies  Allergen Reactions  . Adhesive [Tape] Dermatitis    Redness at adhesive sites    Consultations:  Gastroenterology  General surgery   Procedures/Studies: Dg Cholangiogram Operative  Result Date: 06/03/2016 CLINICAL DATA:  Cholecystectomy for cholecystitis. EXAM: INTRAOPERATIVE CHOLANGIOGRAM TECHNIQUE: Cholangiographic images from the C-arm fluoroscopic device were submitted for interpretation post-operatively. Please see the procedural report for the amount of contrast and the fluoroscopy time utilized. COMPARISON:  CT of the abdomen on 06/02/2016 as well as imaging during ERCP on 06/02/2016. FINDINGS: Images obtained with a C-arm intraoperatively demonstrate normally patent opacified common bile duct without evidence of filling defect. Contrast enters the duodenum. No contrast extravasation identified. IMPRESSION: Normal intraoperative cholangiogram. Electronically Signed   By: Irish Lack M.D.   On: 06/03/2016 15:35   Ct Abdomen Pelvis W Contrast  Result Date: 06/02/2016 CLINICAL DATA:  41 y/o F; worsening abdominal pain and increasing liver function tests. Bile ducts stent placed last week. EXAM: CT ABDOMEN AND PELVIS WITH CONTRAST TECHNIQUE: Multidetector CT imaging of the abdomen and pelvis was performed using the standard protocol following bolus administration of intravenous contrast. CONTRAST:  ISOVUE-300 IOPAMIDOL (ISOVUE-300) INJECTION 61% COMPARISON:  05/26/2016 MRI of the abdomen and 05/25/2016 CT of the abdomen and  pelvis. FINDINGS: Lower chest: No acute abnormality. Hepatobiliary: No focal liver lesion. No intra or extrahepatic biliary ductal dilatation. 3 mm gallstones are present within the gallbladder fundus. Moderate diffuse gallbladder wall thickening increased from prior CT. 4 mm stone within the lower common bile duct. No biliary stent identified. Pancreas: Unremarkable. No pancreatic ductal dilatation or surrounding inflammatory changes. Spleen: Normal in size without focal abnormality. Adrenals/Urinary Tract: Adrenal glands are unremarkable. Kidneys are normal, without renal calculi, focal lesion, or hydronephrosis. Bladder is unremarkable. Stomach/Bowel: Stomach is within normal limits. Appendix appears normal. No evidence of bowel wall thickening, distention, or inflammatory changes. Vascular/Lymphatic: Aortic atherosclerosis with mild calcification. No enlarged abdominal or pelvic lymph nodes. Reproductive: Uterus and bilateral adnexa are unremarkable. Other: No abdominal wall hernia or abnormality. No abdominopelvic ascites. Musculoskeletal: Stable grade 1 L5-S1 anterolisthesis and chronic pars defects. With IMPRESSION: 1. 4 mm stone within the lower common bile duct in head of pancreas. This stone appears increased in size in comparison with the prior stone and probably represents passage of a new stone. 2. Moderate gallbladder wall thickening increased from prior CT may represent developing acute cholecystitis. 3. No bile duct stent identified. 4. Additional 2-3 mm gallstones are present within the gallbladder fundus. Electronically Signed   By: Mitzi Hansen M.D.   On: 06/02/2016 06:13   Ct Abdomen Pelvis W Contrast  Result Date: 05/25/2016 CLINICAL DATA:  Upper abdominal  pain.  Right upper quadrant pain. EXAM: CT ABDOMEN AND PELVIS WITH CONTRAST TECHNIQUE: Multidetector CT imaging of the abdomen and pelvis was performed using the standard protocol following bolus administration of intravenous  contrast. CONTRAST:  100 cc Isovue-300 IV COMPARISON:  None. FINDINGS: Lower chest: Dependent atelectasis at the lung bases, possible scarring in the left lower lobe. Hepatobiliary: No focal hepatic lesion. Multiple calcified gallstones within the gallbladder. Mild gallbladder wall thickening. Calcified 2 mm stone in the distal most common bile duct. Mild intra and extrahepatic biliary dilatation, the common bile duct measures at the porta hepatis. Borderline hepatic steatosis. Pancreas: No ductal dilatation or inflammation. Spleen: Normal in size without focal abnormality. Adrenals/Urinary Tract: Normal adrenal glands. No hydronephrosis. Symmetric renal enhancement and excretion on delayed phase imaging. Urinary bladder is near completely decompressed. Stomach/Bowel: Stomach is physiologically distended with ingested contrast. The appendix is normal. Colon is decompressed limiting assessment, no evidence of colonic inflammation. No small bowel wall thickening, distention or inflammation. Vascular/Lymphatic: Retroaortic left renal vein, incidentally noted. Trace abdominal aortic atherosclerosis. No adenopathy. Reproductive: 2 cm follicular cyst in the right ovary is likely incidental. Uterus and left ovary are unremarkable. Small amount of simple free fluid in the pelvic cul-de-sac. Other: No free air. No abdominal ascites. Small fat containing umbilical hernia. Musculoskeletal: Bilateral L5 pars interarticularis defects with trace anterolisthesis of L5 on S1. There are no acute or suspicious osseous abnormalities. IMPRESSION: 1. Cholelithiasis with a choledocholithiasis, a 2 mm stone is in the distal common bile duct. Resultant mild biliary dilatation and gallbladder inflammation. 2. Minimal aortic atherosclerosis. Electronically Signed   By: Rubye Oaks M.D.   On: 05/25/2016 05:05   Dg Retrograde Pyelogram  Result Date: 05/26/2016 CLINICAL DATA:  Common bile duct stones. EXAM: ERCP FLUOROSCOPY TIME:  4  minutes, 54 seconds COMPARISON:  MRCP FINDINGS: 7 spot intraoperative radiographic images of the right upper abdominal quadrant during ERCP are provided for review Initial images demonstrate an ERCP probe overlying the right upper abdominal quadrant. Selective images demonstrate selective cannulation and opacification of the pancreatic duct which appears nondilated. Subsequent images demonstrate selective cannulation opacification of the common bile duct which appears nondilated. There is minimal opacification of the cystic duct and gallbladder. Three discrete filling defects are seen with the gallbladder fundus compatible with gallstones. There are no discrete persistent filling defects are seen with the common bile duct to suggest the presence choledocholithiasis. Subsequent images demonstrate insufflation of a balloon within the mid aspect of the common bile duct with subsequent presumed sweeping and biliary sphincterotomy. IMPRESSION: ERCP with findings of cholelithiasis, biliary sweeping and presumed sphincterotomy. Electronically Signed   By: Simonne Come M.D.   On: 05/26/2016 16:52   Mr 3d Recon At Scanner  Result Date: 05/26/2016 CLINICAL DATA:  Upper abdominal pain and swelling. EXAM: MRI ABDOMEN WITHOUT AND WITH CONTRAST (INCLUDING MRCP) TECHNIQUE: Multiplanar multisequence MR imaging of the abdomen was performed both before and after the administration of intravenous contrast. Heavily T2-weighted images of the biliary and pancreatic ducts were obtained, and three-dimensional MRCP images were rendered by post processing. CONTRAST:  18mL MULTIHANCE GADOBENATE DIMEGLUMINE 529 MG/ML IV SOLN COMPARISON:  CT scan 05/25/2016 FINDINGS: Despite efforts by the technologist and patient, motion artifact is present on today's exam and could not be eliminated. This reduces exam sensitivity and specificity. Lower chest: Linear subsegmental atelectasis anteriorly in the left lower lobe. Trace bilateral pleural  effusions. Hepatobiliary: Small gallstones are present dependently in the gallbladder. Gallbladder wall thickness upper normal.  No significant abnormal focal parenchymal enhancement in the liver. There is some faint accentuated enhancement along the margins of the distal common bile duct for example image 50/24, possibly due to low-grade local inflammation. 3 mm filling defect in the common bile duct on image 23/4 compatible with choledocholithiasis. The more distal stone in the ampullary region is not well seen, although sensitivity is reduced by the considerable degree of motion artifact. Pancreas:  Unremarkable.  No dorsal pancreatic duct dilatation. Spleen:  Unremarkable Adrenals/Urinary Tract:  Unremarkable Stomach/Bowel: Unremarkable Vascular/Lymphatic: Retroaortic left renal vein. Otherwise unremarkable. Other:  No supplemental non-categorized findings. Musculoskeletal: Unremarkable IMPRESSION: 1. MRI confirms several small gallstones in the gallbladder with a single stone currently visible in the common bile duct about 2.4 cm proximal to the ampulla. This may represent proximal migration of the stone previously seen in the ampulla. Currently no biliary dilatation. Gallbladder wall thickness upper normal. No focal liver lesion. 2. Trace bilateral pleural effusions with subsegmental atelectasis in the left lower lobe. Electronically Signed   By: Gaylyn Rong M.D.   On: 05/26/2016 11:07   Dg Ercp Biliary & Pancreatic Ducts  Result Date: 06/02/2016 CLINICAL DATA:  Choledocholithiasis by CT EXAM: ERCP with sphincterotomy TECHNIQUE: Multiple spot images obtained with the fluoroscopic device and submitted for interpretation post-procedure. FLUOROSCOPY TIME:  Fluoroscopy Time:  7 minutes 46 seconds COMPARISON:  06/02/2016 CT FINDINGS: Limited cholangiogram during the procedure. This demonstrates cannulation of the bile duct and retrograde cholangiogram. Sphincterotomy performed. Retrograde injection  demonstrates no biliary obstruction or dilatation. Common bile duct is normal in caliber. Mobile filling defects are most consistent with injected air bubbles. Balloon sweep performed of the bile duct. Please refer to the ERCP note for further details of the procedure. IMPRESSION: Limited imaging during ERCP as above. No biliary obstruction or significant dilatation. These images were submitted for radiologic interpretation only. Please see the procedural report for the amount of contrast and the fluoroscopy time utilized. Electronically Signed   By: Judie Petit.  Shick M.D.   On: 06/02/2016 11:50   Mr Abdomen Mrcp Vivien Rossetti Contast  Result Date: 05/26/2016 CLINICAL DATA:  Upper abdominal pain and swelling. EXAM: MRI ABDOMEN WITHOUT AND WITH CONTRAST (INCLUDING MRCP) TECHNIQUE: Multiplanar multisequence MR imaging of the abdomen was performed both before and after the administration of intravenous contrast. Heavily T2-weighted images of the biliary and pancreatic ducts were obtained, and three-dimensional MRCP images were rendered by post processing. CONTRAST:  18mL MULTIHANCE GADOBENATE DIMEGLUMINE 529 MG/ML IV SOLN COMPARISON:  CT scan 05/25/2016 FINDINGS: Despite efforts by the technologist and patient, motion artifact is present on today's exam and could not be eliminated. This reduces exam sensitivity and specificity. Lower chest: Linear subsegmental atelectasis anteriorly in the left lower lobe. Trace bilateral pleural effusions. Hepatobiliary: Small gallstones are present dependently in the gallbladder. Gallbladder wall thickness upper normal. No significant abnormal focal parenchymal enhancement in the liver. There is some faint accentuated enhancement along the margins of the distal common bile duct for example image 50/24, possibly due to low-grade local inflammation. 3 mm filling defect in the common bile duct on image 23/4 compatible with choledocholithiasis. The more distal stone in the ampullary region is not  well seen, although sensitivity is reduced by the considerable degree of motion artifact. Pancreas:  Unremarkable.  No dorsal pancreatic duct dilatation. Spleen:  Unremarkable Adrenals/Urinary Tract:  Unremarkable Stomach/Bowel: Unremarkable Vascular/Lymphatic: Retroaortic left renal vein. Otherwise unremarkable. Other:  No supplemental non-categorized findings. Musculoskeletal: Unremarkable IMPRESSION: 1. MRI confirms several small gallstones  in the gallbladder with a single stone currently visible in the common bile duct about 2.4 cm proximal to the ampulla. This may represent proximal migration of the stone previously seen in the ampulla. Currently no biliary dilatation. Gallbladder wall thickness upper normal. No focal liver lesion. 2. Trace bilateral pleural effusions with subsegmental atelectasis in the left lower lobe. Electronically Signed   By: Gaylyn Rong M.D.   On: 05/26/2016 11:07    4/3 ERCP: Biliary papillary stenosis, benign. - A biliary sphincterotomy was performed.  - The biliary tree was swept and nothing was found.  4/4 1)Laparoscopic Cholecystectomy 2.) Intra-operative Cholangiogram   Subjective: Feeling better. Pain controlled. Wants to go home  Discharge Exam: Vitals:   06/04/16 0544 06/04/16 0944  BP: (!) 102/54 112/64  Pulse: 63 70  Resp: 16 16  Temp: 98.4 F (36.9 C) 98.5 F (36.9 C)   Vitals:   06/03/16 2022 06/04/16 0544 06/04/16 0924 06/04/16 0944  BP:  (!) 102/54  112/64  Pulse:  63  70  Resp:  16  16  Temp:  98.4 F (36.9 C)  98.5 F (36.9 C)  TempSrc:  Oral  Oral  SpO2: 93% 97% 97% 98%  Weight:      Height:        General: Pt is alert, awake, not in acute distress Cardiovascular: RRR, S1/S2 +, no rubs, no gallops Respiratory: CTA bilaterally, no wheezing, no rhonchi Abdominal: Soft, tender in epigastrium and RUQ, ND, bowel sounds + Extremities: no edema, no cyanosis    The results of significant diagnostics from this  hospitalization (including imaging, microbiology, ancillary and laboratory) are listed below for reference.     Microbiology: Recent Results (from the past 240 hour(s))  Surgical pcr screen     Status: None   Collection Time: 05/25/16  8:03 PM  Result Value Ref Range Status   MRSA, PCR NEGATIVE NEGATIVE Final   Staphylococcus aureus NEGATIVE NEGATIVE Final    Comment:        The Xpert SA Assay (FDA approved for NASAL specimens in patients over 15 years of age), is one component of a comprehensive surveillance program.  Test performance has been validated by [facility] for patients greater than or equal to 33 year old. It is not intended to diagnose infection nor to guide or monitor treatment.      Labs: BNP (last 3 results) No results for input(s): BNP in the last 8760 hours. Basic Metabolic Panel:  Recent Labs Lab 06/02/16 0337 06/03/16 0609  NA 134* 135  K 3.8 4.1  CL 101 104  CO2 25 24  GLUCOSE 130* 99  BUN 10 7  CREATININE 0.93 0.72  CALCIUM 9.1 8.4*   Liver Function Tests:  Recent Labs Lab 06/02/16 0337 06/03/16 0609 06/03/16 1655 06/04/16 0614  AST 164* 187* 421* 180*  ALT 97* 170* 388* 257*  ALKPHOS 101 98 123 109  BILITOT 1.0 1.2 1.2 0.6  PROT 7.0 6.5 6.6 5.7*  ALBUMIN 4.1 3.6 3.6 3.1*    Recent Labs Lab 06/02/16 0337 06/04/16 0614  LIPASE 21 15   No results for input(s): AMMONIA in the last 168 hours. CBC:  Recent Labs Lab 06/02/16 0337 06/03/16 0609  WBC 11.6* 7.6  NEUTROABS 9.6*  --   HGB 13.4 12.3  HCT 39.5 37.6  MCV 93.4 96.4  PLT 355 329   Cardiac Enzymes: No results for input(s): CKTOTAL, CKMB, CKMBINDEX, TROPONINI in the last 168 hours. BNP: Invalid input(s): POCBNP  CBG: No results for input(s): GLUCAP in the last 168 hours. D-Dimer No results for input(s): DDIMER in the last 72 hours. Hgb A1c No results for input(s): HGBA1C in the last 72 hours. Lipid Profile No results for input(s): CHOL, HDL, LDLCALC, TRIG,  CHOLHDL, LDLDIRECT in the last 72 hours. Thyroid function studies No results for input(s): TSH, T4TOTAL, T3FREE, THYROIDAB in the last 72 hours.  Invalid input(s): FREET3 Anemia work up No results for input(s): VITAMINB12, FOLATE, FERRITIN, TIBC, IRON, RETICCTPCT in the last 72 hours. Urinalysis    Component Value Date/Time   COLORURINE YELLOW 05/26/2016 1201   APPEARANCEUR CLEAR 05/26/2016 1201   LABSPEC 1.024 05/26/2016 1201   PHURINE 5.0 05/26/2016 1201   GLUCOSEU NEGATIVE 05/26/2016 1201   HGBUR SMALL (A) 05/26/2016 1201   BILIRUBINUR NEGATIVE 05/26/2016 1201   KETONESUR NEGATIVE 05/26/2016 1201   PROTEINUR NEGATIVE 05/26/2016 1201   UROBILINOGEN 0.2 05/21/2014 1405   NITRITE NEGATIVE 05/26/2016 1201   LEUKOCYTESUR NEGATIVE 05/26/2016 1201   Sepsis Labs Invalid input(s): PROCALCITONIN,  WBC,  LACTICIDVEN Microbiology Recent Results (from the past 240 hour(s))  Surgical pcr screen     Status: None   Collection Time: 05/25/16  8:03 PM  Result Value Ref Range Status   MRSA, PCR NEGATIVE NEGATIVE Final   Staphylococcus aureus NEGATIVE NEGATIVE Final    Comment:        The Xpert SA Assay (FDA approved for NASAL specimens in patients over 86 years of age), is one component of a comprehensive surveillance program.  Test performance has been validated by [facility] for patients greater than or equal to 38 year old. It is not intended to diagnose infection nor to guide or monitor treatment.      Time coordinating discharge: Over 30 minutes  SIGNED:   Erick Blinks, MD  [facility] 06/04/2016, 10:05 AM Pager   If 7PM-7AM, please contact night-coverage [email or web address] Password [email or web address]

## 2016-06-04 NOTE — Anesthesia Postprocedure Evaluation (Signed)
Anesthesia Post Note  Patient: Kathleen Stewart  Procedure(s) Performed: Procedure(s) (LRB): LAPAROSCOPIC CHOLECYSTECTOMY WITH INTRAOPERATIVE CHOLANGIOGRAM (N/A)  Patient location during evaluation: Nursing Unit Anesthesia Type: General Level of consciousness: awake and alert, oriented and comatose Pain management: pain level controlled Vital Signs Assessment: post-procedure vital signs reviewed and stable Respiratory status: spontaneous breathing, nonlabored ventilation and respiratory function stable Cardiovascular status: blood pressure returned to baseline Postop Assessment: no signs of nausea or vomiting Anesthetic complications: no     Last Vitals:  Vitals:   06/04/16 0544 06/04/16 0944  BP: (!) 102/54 112/64  Pulse: 63 70  Resp: 16 16  Temp: 36.9 C 36.9 C    Last Pain:  Vitals:   06/04/16 0944  TempSrc: Oral  PainSc:                  Jakory Matsuo J

## 2016-06-05 ENCOUNTER — Encounter (HOSPITAL_COMMUNITY): Payer: Self-pay | Admitting: Gastroenterology

## 2016-06-16 ENCOUNTER — Encounter: Payer: Self-pay | Admitting: General Surgery

## 2016-06-16 ENCOUNTER — Ambulatory Visit (INDEPENDENT_AMBULATORY_CARE_PROVIDER_SITE_OTHER): Payer: Self-pay | Admitting: General Surgery

## 2016-06-16 VITALS — BP 118/75 | HR 83 | Temp 99.8°F | Resp 18 | Ht 64.0 in | Wt 188.0 lb

## 2016-06-16 DIAGNOSIS — Z09 Encounter for follow-up examination after completed treatment for conditions other than malignant neoplasm: Secondary | ICD-10-CM

## 2016-06-16 MED ORDER — OXYCODONE-ACETAMINOPHEN 5-325 MG PO TABS: 1.0000 | ORAL_TABLET | ORAL | 0 refills | Status: DC | PRN

## 2016-06-16 NOTE — Progress Notes (Signed)
Subjective:     Kathleen Stewart  Status post laparoscopic cholecystectomy with cholangiograms. Patient does have incisional pain just below her rib cage at the epigastric trocar site. She states he occasionally sees swelling there. She denies any fever, chills, or nausea. Objective:    BP 118/75   Pulse 83   Temp 99.8 F (37.7 C)   Resp 18   Ht  (1.626 m)   Wt 188 lb (85.3 kg)   BMI 32.27 kg/m   General:  alert, cooperative and no distress  Abdomen is soft. Incisions healing well. No appreciable hernia present this time.     Assessment:    Doing well postoperatively.    Plan:   Patient having incisional pain as she has increased her activity. Percocet has been prescribed. She was instructed to return in 1-2 monthRaynelle Dickhave continued pain or swelling. She understands and agrees.

## 2016-06-23 ENCOUNTER — Ambulatory Visit: Payer: 59 | Admitting: General Surgery

## 2017-05-03 ENCOUNTER — Telehealth: Payer: Self-pay | Admitting: Vascular Surgery

## 2017-05-03 NOTE — Telephone Encounter (Signed)
-----   Message from Retta Macebecca J Roberts, RN sent at 04/30/2017  4:19 PM EST ----- Regarding: FW: OFFICE APPOINTMENT This patient is canceled. We are done with this. B ----- Message ----- From: Jena Gaussoczniak, Michele A Sent: 04/28/2017  12:39 PM To: Retta Macebecca J Roberts, RN Subject: RE: OFFICE APPOINTMENT                         What happened with this pt?  ----- Message ----- From: Retta Macoberts, Rebecca J, RN Sent: 04/22/2017  11:59 AM To: [facility] Subject: OFFICE APPOINTMENT                             PLEASE SCHEDULE OFFICE APPOINTMENT WITH DR. EARLY. Surgery date for ALIF is 06/02/17. Thank you, b

## 2017-05-18 ENCOUNTER — Other Ambulatory Visit: Payer: Self-pay | Admitting: *Deleted

## 2017-05-25 ENCOUNTER — Encounter: Payer: 59 | Admitting: Vascular Surgery

## 2017-06-03 ENCOUNTER — Other Ambulatory Visit: Payer: Self-pay | Admitting: *Deleted

## 2017-06-08 ENCOUNTER — Ambulatory Visit: Payer: 59 | Admitting: Vascular Surgery

## 2017-06-08 ENCOUNTER — Encounter: Payer: Self-pay | Admitting: Vascular Surgery

## 2017-06-08 VITALS — BP 112/71 | HR 78 | Resp 18 | Ht 64.0 in | Wt 188.5 lb

## 2017-06-08 DIAGNOSIS — M5137 Other intervertebral disc degeneration, lumbosacral region: Secondary | ICD-10-CM

## 2017-06-08 NOTE — Progress Notes (Signed)
[facility] [place]  Patient name: Kathleen Stewart MRN: [id number] DOB: 12/13/1975 Sex: female  REASON FOR CONSULT: Discuss t anterior exposure for L5-S1 fusion  HPI: Kathleen Stewart is a 42 y.o. female, who is here today for discussion of upcoming surgery for L5-S1 fusion.  She has been seen in consultation with Dr. Estill BambergMark Dumonski who is recommended surgery from an anterior approach.  She reports that she has been in her back extending down into both legs is been present for 20 years.  She had had some response to spine injections 20 years ago but has had no symptom relief with recent similar treatments.  This interferes with her activity of daily living and is recommended that she undergo surgery for treatment of this.  He has had prior cholecystectomy with some difficulty with this operative pain.  She did have intrauterine ablation but no other abdominal surgery.  Past Medical History:  Diagnosis Date  . Abdominal pain   . Abnormal Pap smear   . Anxiety   . Bloating   . Bulging lumbar disc   . Chronic back pain   . Chronic diarrhea   . Depression   . Gastroesophageal reflux   . History of abnormal cervical Pap smear 10/25/2013  . History of abnormal Pap smear 10/19/2012  . Insomnia   . Irritable bowel syndrome   . Menorrhagia 04/25/2014  . Night sweats 04/25/2014  . Pelvic pain in female 04/25/2014  . S/P ERCP   . Vaginal Pap smear, abnormal     Family History  Problem Relation Age of Onset  . Emphysema Father   . Other Mother        fibroid tumors-had hyst  . Colon cancer Neg Hx     SOCIAL HISTORY: Social History   Socioeconomic History  . Marital status: Married    Spouse name: Not on file  . Number of children: Not on file  . Years of education: Not on file  . Highest education level: Not on file  Occupational History  . Not on file  Social Needs  . Financial resource strain: Not on file  . Food  insecurity:    Worry: Not on file    Inability: Not on file  . Transportation needs:    Medical: Not on file    Non-medical: Not on file  Tobacco Use  . Smoking status: Current Every Day Smoker    Packs/day: 1.00    Years: 20.00    Pack years: 20.00    Types: Cigarettes  . Smokeless tobacco: Never Used  Substance and Sexual Activity  . Alcohol use: Yes    Comment: socially  . Drug use: No  . Sexual activity: Yes    Birth control/protection: Other-see comments    Comment: vasectomy  Lifestyle  . Physical activity:    Days per week: Not on file    Minutes per session: Not on file  . Stress: Not on file  Relationships  . Social connections:    Talks on phone: Not on file    Gets together: Not on file    Attends religious service: Not on file    Active member of club or organization: Not on file    Attends meetings of clubs or organizations: Not on file    Relationship status: Not on file  . Intimate partner violence:    Fear of current or ex partner: Not on file    Emotionally abused: Not on  file    Physically abused: Not on file    Forced sexual activity: Not on file  Other Topics Concern  . Not on file  Social History Narrative  . Not on file    Allergies  Allergen Reactions  . Adhesive [Tape] Dermatitis    Redness at adhesive sites    Current Outpatient Medications  Medication Sig Dispense Refill  . adapalene (DIFFERIN) 0.1 % gel Apply 1 application topically at bedtime.    . ALPRAZolam (XANAX) 1 MG tablet Take 1 mg by mouth at bedtime.    . Biotin [address] MCG TBDP Take 10,000 mcg by mouth daily.    . cyclobenzaprine (FLEXERIL) 5 MG tablet TAKE 1-2 TABLET(S) BY MOUTH AT BEDTIME AS NEEDED  0  . escitalopram (LEXAPRO) 20 MG tablet Take 10 mg by mouth at bedtime.     Marland Kitchen HYDROcodone-acetaminophen (NORCO/VICODIN) 5-325 MG tablet Take 1 tablet by mouth every 6 (six) hours as needed for moderate pain.    Marland Kitchen ibuprofen (ADVIL,MOTRIN) 200 MG tablet Take 400 mg by mouth  every 8 (eight) hours as needed (for pain/headaches). LIQUIDGELS    . omeprazole (PRILOSEC) 40 MG capsule Take 40 mg by mouth 2 (two) times daily.    . Probiotic Product (TRUBIOTICS PO) Take 1 tablet by mouth daily.     . traMADol (ULTRAM) 50 MG tablet TAKE 1 TO 2 TABLETS BY MOUTH 3 TIMES A DAY AS NEEDED FOR PAIN  0  . Alpha-D-Galactosidase (BEANO) TABS Take 2 tablets by mouth as needed (gas relief).     . ondansetron (ZOFRAN) 4 MG tablet Take 4 mg by mouth every 8 (eight) hours as needed for nausea or vomiting.    Marland Kitchen oxyCODONE-acetaminophen (PERCOCET/ROXICET) 5-325 MG tablet Take 1-2 tablets by mouth every 4 (four) hours as needed for moderate pain. (Patient not taking: Reported on 06/08/2017) 40 tablet 0   No current facility-administered medications for this visit.     REVIEW OF SYSTEMS:  [X]  denotes positive finding, [ ]  denotes negative finding Cardiac  Comments:  Chest pain or chest pressure:    Shortness of breath upon exertion:    Short of breath when lying flat:    Irregular heart rhythm:        Vascular    Pain in calf, thigh, or hip brought on by ambulation:    Pain in feet at night that wakes you up from your sleep:     Blood clot in your veins:    Leg swelling:         Pulmonary    Oxygen at home:    Productive cough:     Wheezing:         Neurologic    Sudden weakness in arms or legs:     Sudden numbness in arms or legs:     Sudden onset of difficulty speaking or slurred speech:    Temporary loss of vision in one eye:     Problems with dizziness:         Gastrointestinal    Blood in stool:     Vomited blood:         Genitourinary    Burning when urinating:     Blood in urine:        Psychiatric    Major depression:         Hematologic    Bleeding problems:    Problems with blood clotting too easily:        Skin  Rashes or ulcers:        Constitutional    Fever or chills:      PHYSICAL EXAM: Vitals:   06/08/17 1227  BP: 112/71  Pulse: 78    Resp: 18  SpO2: 97%  Weight: 188 lb 8 oz (85.5 kg)  Height: 5\' 4"  (1.626 m)    GENERAL: The patient is a well-nourished female, in no acute distress. The vital signs are documented above. CARDIOVASCULAR: 2+ radial and 2+ dorsalis pedis pulses bilaterally PULMONARY: There is good air exchange  ABDOMEN: Soft and non-tender.  No masses noted. MUSCULOSKELETAL: There are no major deformities or cyanosis. NEUROLOGIC: No focal weakness or paresthesias are detected. SKIN: There are no ulcers or rashes noted. PSYCHIATRIC: The patient has a normal affect.  DATA:  CT scan from 05/2016 was reviewed showing no evidence of aortic or iliac atherosclerotic change  MEDICAL ISSUES: Had long discussion with the patient explaining my role for exposure for L5-S1 fusion.  Explained mobilization of the rectus muscle, peritoneal sac to the right.  Also explained mobilization of the left ureter to the right and the arterial and venous structures overlying the spine.  Explained the potential for injury to all of these.  Explained the most vulnerable of these is the venous structures overlying the spine.  Patient understands procedure and wishes to proceed as scheduled and explained that I do not see any contraindications for anterior exposure from my standpoint.   Larina Earthly, MD FACS [facility] Tel [phone] Pager [phone]

## 2017-06-29 ENCOUNTER — Encounter: Payer: 59 | Admitting: Vascular Surgery

## 2017-07-01 ENCOUNTER — Other Ambulatory Visit: Payer: Self-pay | Admitting: Orthopedic Surgery

## 2017-07-12 NOTE — Pre-Procedure Instructions (Signed)
Kathleen Stewart  07/12/2017      [id number] - [place], [place] - [address] AT [facility] [address] [place] [address] Phone: [phone] Fax: [phone]    Your procedure is scheduled on Wed. May 15  Report to [facility] Admitting at 6:30  A.M.  Call this number if you have problems the morning of surgery:  [phone]   Remember:  Do not eat food or drink liquids after midnight.   Take these medicines the morning of surgery with A SIP OF WATER : hydrocodone or tramadol if needed, omeprazole (prilosec)              7 days prior to surgery STOP taking any Aspirin(unless otherwise instructed by your surgeon), Aleve, Naproxen, Ibuprofen, Motrin, Advil, Goody's, BC's, all herbal medications, fish oil, and all vitamins   Do not wear jewelry, make-up or nail polish.  Do not wear lotions, powders, or perfumes, or deodorant.  Do not shave 48 hours prior to surgery.  Men may shave face and neck.  Do not bring valuables to the hospital.  [facility] is not responsible for any belongings or valuables.  Contacts, dentures or bridgework may not be worn into surgery.  Leave your suitcase in the car.  After surgery it may be brought to your room.  For patients admitted to the hospital, discharge time will be determined by your treatment team.  Patients discharged the day of surgery will not be allowed to drive home.   Special instructions:   [place]- Preparing For Surgery  Before surgery, you can play an important role. Because skin is not sterile, your skin needs to be as free of germs as possible. You can reduce the number of germs on your skin by washing with CHG (chlorahexidine gluconate) Soap before surgery.  CHG is an antiseptic cleaner which kills germs and bonds with the skin to continue killing germs even after washing.  Oral Hygiene is also important to reduce your risk of infection.  Remember - BRUSH YOUR TEETH THE MORNING OF  SURGERY  Please do not use if you have an allergy to CHG or antibacterial soaps. If your skin becomes reddened/irritated stop using the CHG.  Do not shave (including legs and underarms) for at least 48 hours prior to first CHG shower. It is OK to shave your face.  Please follow these instructions carefully.   1. Shower the NIGHT BEFORE SURGERY and the MORNING OF SURGERY with CHG.   2. If you chose to wash your hair, wash your hair first as usual with your normal shampoo.  3. After you shampoo, rinse your hair and body thoroughly to remove the shampoo.  4. Use CHG as you would any other liquid soap. You can apply CHG directly to the skin and wash gently with a scrungie or a clean washcloth.   5. Apply the CHG Soap to your body ONLY FROM THE NECK DOWN.  Do not use on open wounds or open sores. Avoid contact with your eyes, ears, mouth and genitals (private parts). Wash Face and genitals (private parts)  with your normal soap.  6. Wash thoroughly, paying special attention to the area where your surgery will be performed.  7. Thoroughly rinse your body with warm water from the neck down.  8. DO NOT shower/wash with your normal soap after using and rinsing off the CHG Soap.  9. Pat yourself dry with a CLEAN TOWEL.  10. Wear  CLEAN PAJAMAS to bed the night before surgery, wear comfortable clothes the morning of surgery  11. Place CLEAN SHEETS on your bed the night of your first shower and DO NOT SLEEP WITH PETS.    Day of Surgery: Do not apply any deodorants/lotions. Please wear clean clothes to the hospital/surgery center.  Remember to brush your teeth.      Please read over the following fact sheets that you were given. Coughing and Deep Breathing and Surgical Site Infection Prevention

## 2017-07-13 ENCOUNTER — Ambulatory Visit (HOSPITAL_COMMUNITY)
Admission: RE | Admit: 2017-07-13 | Discharge: 2017-07-13 | Disposition: A | Discharge status: Home / Self Care | Payer: 59 | Source: Ambulatory Visit | Attending: Orthopedic Surgery | Admitting: Orthopedic Surgery

## 2017-07-13 ENCOUNTER — Encounter (HOSPITAL_COMMUNITY)
Admission: RE | Admit: 2017-07-13 | Discharge: 2017-07-13 | Disposition: A | Discharge status: Home / Self Care | Payer: 59 | Source: Ambulatory Visit | Attending: Orthopedic Surgery | Admitting: Orthopedic Surgery

## 2017-07-13 ENCOUNTER — Other Ambulatory Visit: Payer: Self-pay

## 2017-07-13 ENCOUNTER — Encounter (HOSPITAL_COMMUNITY): Payer: Self-pay

## 2017-07-13 DIAGNOSIS — Z0183 Encounter for blood typing: Secondary | ICD-10-CM | POA: Insufficient documentation

## 2017-07-13 DIAGNOSIS — Z01818 Encounter for other preprocedural examination: Secondary | ICD-10-CM

## 2017-07-13 DIAGNOSIS — Z01812 Encounter for preprocedural laboratory examination: Secondary | ICD-10-CM

## 2017-07-13 LAB — COMPREHENSIVE METABOLIC PANEL
ALBUMIN: 3.9 g/dL (ref 3.5–5.0)
ALK PHOS: 40 U/L (ref 38–126)
ALT: 13 U/L — ABNORMAL LOW (ref 14–54)
AST: 15 U/L (ref 15–41)
Anion gap: 7 (ref 5–15)
BILIRUBIN TOTAL: 0.6 mg/dL (ref 0.3–1.2)
BUN: 9 mg/dL (ref 6–20)
CO2: 24 mmol/L (ref 22–32)
Calcium: 8.9 mg/dL (ref 8.9–10.3)
Chloride: 108 mmol/L (ref 101–111)
Creatinine, Ser: 0.93 mg/dL (ref 0.44–1.00)
GFR calc Af Amer: >60 mL/min (ref >60)
GFR calc non Af Amer: >60 mL/min (ref >60)
Glucose, Bld: 88 mg/dL (ref 65–99)
POTASSIUM: 4 mmol/L (ref 3.5–5.1)
SODIUM: 139 mmol/L (ref 135–145)
TOTAL PROTEIN: 6.6 g/dL (ref 6.5–8.1)

## 2017-07-13 LAB — CBC WITH DIFFERENTIAL/PLATELET
Abs Immature Granulocytes: 0 10*3/uL (ref 0.0–0.1)
BASOS ABS: 0.1 10*3/uL (ref 0.0–0.1)
Basophils Relative: 1 %
Eosinophils Absolute: 0.2 10*3/uL (ref 0.0–0.7)
Eosinophils Relative: 2 %
HEMATOCRIT: 40.6 % (ref 36.0–46.0)
HEMOGLOBIN: 13.4 g/dL (ref 12.0–15.0)
Immature Granulocytes: 0 %
LYMPHS ABS: 2 10*3/uL (ref 0.7–4.0)
LYMPHS PCT: 26 %
MCH: 30.7 pg (ref 26.0–34.0)
MCHC: 33 g/dL (ref 30.0–36.0)
MCV: 92.9 fL (ref 78.0–100.0)
Monocytes Absolute: 0.6 10*3/uL (ref 0.1–1.0)
Monocytes Relative: 8 %
NEUTROS PCT: 63 %
Neutro Abs: 4.8 10*3/uL (ref 1.7–7.7)
Platelets: 349 10*3/uL (ref 150–400)
RBC: 4.37 MIL/uL (ref 3.87–5.11)
RDW: 12.4 % (ref 11.5–15.5)
WBC: 7.6 10*3/uL (ref 4.0–10.5)

## 2017-07-13 LAB — URINALYSIS, ROUTINE W REFLEX MICROSCOPIC
BILIRUBIN URINE: NEGATIVE
Glucose, UA: NEGATIVE mg/dL
HGB URINE DIPSTICK: NEGATIVE
KETONES UR: 5 mg/dL — AB
Leukocytes, UA: NEGATIVE
NITRITE: NEGATIVE
Protein, ur: NEGATIVE mg/dL
SPECIFIC GRAVITY, URINE: 1.02 (ref 1.005–1.030)
pH: 5 (ref 5.0–8.0)

## 2017-07-13 LAB — TYPE AND SCREEN
ABO/RH(D): O POS
ANTIBODY SCREEN: NEGATIVE

## 2017-07-13 LAB — SURGICAL PCR SCREEN
MRSA, PCR: NEGATIVE
STAPHYLOCOCCUS AUREUS: NEGATIVE

## 2017-07-13 LAB — PROTIME-INR
INR: 1.01
Prothrombin Time: 13.3 seconds (ref 11.4–15.2)

## 2017-07-13 LAB — ABO/RH: ABO/RH(D): O POS

## 2017-07-13 LAB — APTT: aPTT: 32 seconds (ref 24–36)

## 2017-07-13 NOTE — Anesthesia Preprocedure Evaluation (Addendum)
Anesthesia Evaluation  Patient identified by MRN, date of birth, ID band Patient awake    Reviewed: Allergy & Precautions, H&P , NPO status , Patient's Chart, lab work & pertinent test results  Airway Mallampati: II  TM Distance: >3 FB Neck ROM: Full    Dental no notable dental hx. (+) Teeth Intact, Dental Advisory Given   Pulmonary Current Smoker,    Pulmonary exam normal breath sounds clear to auscultation       Cardiovascular Exercise Tolerance: Good negative cardio ROS   Rhythm:Regular Rate:Normal     Neuro/Psych Anxiety Depression negative neurological ROS     GI/Hepatic Neg liver ROS, GERD  Medicated,  Endo/Other  negative endocrine ROS  Renal/GU negative Renal ROS  negative genitourinary   Musculoskeletal   Abdominal   Peds  Hematology negative hematology ROS (+)   Anesthesia Other Findings   Reproductive/Obstetrics negative OB ROS                            Anesthesia Physical Anesthesia Plan  ASA: II  Anesthesia Plan: General   Post-op Pain Management:    Induction: Intravenous  PONV Risk Score and Plan: 3 and Ondansetron, Dexamethasone and Midazolam  Airway Management Planned: Oral ETT  Additional Equipment:   Intra-op Plan:   Post-operative Plan: Extubation in OR  Informed Consent: I have reviewed the patients History and Physical, chart, labs and discussed the procedure including the risks, benefits and alternatives for the proposed anesthesia with the patient or authorized representative who has indicated his/her understanding and acceptance.   Dental advisory given  Plan Discussed with: CRNA  Anesthesia Plan Comments:         Anesthesia Quick Evaluation

## 2017-07-13 NOTE — Progress Notes (Signed)
PCP - Dr. Hughie Closs- [place]  Cardiologist - Denies  Chest x-ray - 07/13/17  EKG - 07/13/17  Stress Test - Denies  ECHO - Denies  Cardiac Cath - Denies  Sleep Study - Denies CPAP - None  LABS- 07/13/17: CBC w/D, CMP, PT, PTT, T/S, UA POC Upreg- 07/14/17  ASA- Denies   Anesthesia- No  Pt denies having chest pain, sob, or fever at this time. All instructions explained to the pt, with a verbal understanding of the material. Pt agrees to go over the instructions while at home for a better understanding. The opportunity to ask questions was provided.

## 2017-07-14 ENCOUNTER — Inpatient Hospital Stay (HOSPITAL_COMMUNITY)
Admission: RE | Admit: 2017-07-14 | Discharge: 2017-07-16 | DRG: 455 | Disposition: A | Discharge status: Home / Self Care | Payer: 59 | Source: Ambulatory Visit | Attending: Orthopedic Surgery | Admitting: Orthopedic Surgery

## 2017-07-14 ENCOUNTER — Inpatient Hospital Stay (HOSPITAL_COMMUNITY): Payer: 59

## 2017-07-14 ENCOUNTER — Encounter (HOSPITAL_COMMUNITY)
Admission: RE | Disposition: A | Discharge status: Home / Self Care | Payer: Self-pay | Source: Ambulatory Visit | Attending: Orthopedic Surgery

## 2017-07-14 ENCOUNTER — Inpatient Hospital Stay (HOSPITAL_COMMUNITY): Payer: 59 | Admitting: Certified Registered"

## 2017-07-14 ENCOUNTER — Encounter (HOSPITAL_COMMUNITY): Payer: Self-pay | Admitting: *Deleted

## 2017-07-14 DIAGNOSIS — F329 Major depressive disorder, single episode, unspecified: Secondary | ICD-10-CM | POA: Diagnosis present

## 2017-07-14 DIAGNOSIS — M5410 Radiculopathy, site unspecified: Secondary | ICD-10-CM | POA: Diagnosis present

## 2017-07-14 DIAGNOSIS — F17210 Nicotine dependence, cigarettes, uncomplicated: Secondary | ICD-10-CM | POA: Diagnosis present

## 2017-07-14 DIAGNOSIS — M4807 Spinal stenosis, lumbosacral region: Secondary | ICD-10-CM | POA: Diagnosis present

## 2017-07-14 DIAGNOSIS — G8929 Other chronic pain: Secondary | ICD-10-CM | POA: Diagnosis present

## 2017-07-14 DIAGNOSIS — Z9049 Acquired absence of other specified parts of digestive tract: Secondary | ICD-10-CM | POA: Diagnosis not present

## 2017-07-14 DIAGNOSIS — K219 Gastro-esophageal reflux disease without esophagitis: Secondary | ICD-10-CM | POA: Diagnosis present

## 2017-07-14 DIAGNOSIS — M4317 Spondylolisthesis, lumbosacral region: Secondary | ICD-10-CM | POA: Diagnosis present

## 2017-07-14 DIAGNOSIS — F419 Anxiety disorder, unspecified: Secondary | ICD-10-CM | POA: Diagnosis present

## 2017-07-14 DIAGNOSIS — M5117 Intervertebral disc disorders with radiculopathy, lumbosacral region: Secondary | ICD-10-CM | POA: Diagnosis present

## 2017-07-14 DIAGNOSIS — Z91048 Other nonmedicinal substance allergy status: Secondary | ICD-10-CM | POA: Diagnosis not present

## 2017-07-14 DIAGNOSIS — Z419 Encounter for procedure for purposes other than remedying health state, unspecified: Secondary | ICD-10-CM

## 2017-07-14 DIAGNOSIS — M5416 Radiculopathy, lumbar region: Secondary | ICD-10-CM

## 2017-07-14 DIAGNOSIS — M5137 Other intervertebral disc degeneration, lumbosacral region: Secondary | ICD-10-CM | POA: Diagnosis not present

## 2017-07-14 HISTORY — PX: ANTERIOR LUMBAR FUSION: SHX1170

## 2017-07-14 HISTORY — PX: ABDOMINAL EXPOSURE: SHX5708

## 2017-07-14 LAB — POCT PREGNANCY, URINE: Preg Test, Ur: NEGATIVE

## 2017-07-14 SURGERY — ANTERIOR LUMBAR FUSION 1 LEVEL
Anesthesia: General

## 2017-07-14 MED ORDER — KETAMINE HCL 10 MG/ML IJ SOLN
INTRAMUSCULAR | Status: AC
  Filled 2017-07-14: qty 1

## 2017-07-14 MED ORDER — ONDANSETRON HCL 4 MG/2ML IJ SOLN
4.0000 mg | Freq: Four times a day (QID) | INTRAMUSCULAR | Status: DC | PRN
  Administered 2017-07-14: 4 mg via INTRAVENOUS
  Filled 2017-07-14: qty 2

## 2017-07-14 MED ORDER — HYDROMORPHONE HCL 2 MG/ML IJ SOLN
0.2500 mg | INTRAMUSCULAR | Status: DC | PRN
  Administered 2017-07-14 (×4): 0.5 mg via INTRAVENOUS

## 2017-07-14 MED ORDER — OXYCODONE-ACETAMINOPHEN 5-325 MG PO TABS
1.0000 | ORAL_TABLET | ORAL | Status: DC | PRN
  Administered 2017-07-14 – 2017-07-16 (×5): 2 via ORAL
  Filled 2017-07-14 (×4): qty 2

## 2017-07-14 MED ORDER — GABAPENTIN 300 MG PO CAPS
600.0000 mg | ORAL_CAPSULE | Freq: Once | ORAL | Status: AC
  Administered 2017-07-14: 600 mg via ORAL

## 2017-07-14 MED ORDER — ALPRAZOLAM 0.5 MG PO TABS
1.0000 mg | ORAL_TABLET | Freq: Every day | ORAL | Status: DC
  Administered 2017-07-14 – 2017-07-15 (×2): 1 mg via ORAL
  Filled 2017-07-14 (×2): qty 2

## 2017-07-14 MED ORDER — ONDANSETRON HCL 4 MG PO TABS: 4.0000 mg | ORAL_TABLET | Freq: Four times a day (QID) | ORAL | Status: DC | PRN

## 2017-07-14 MED ORDER — ONDANSETRON HCL 4 MG/2ML IJ SOLN
INTRAMUSCULAR | Status: DC | PRN
  Administered 2017-07-14: 4 mg via INTRAVENOUS

## 2017-07-14 MED ORDER — BISACODYL 5 MG PO TBEC: 5.0000 mg | DELAYED_RELEASE_TABLET | Freq: Every day | ORAL | Status: DC | PRN

## 2017-07-14 MED ORDER — LIDOCAINE 2% (20 MG/ML) 5 ML SYRINGE
INTRAMUSCULAR | Status: AC
  Filled 2017-07-14: qty 5

## 2017-07-14 MED ORDER — DOCUSATE SODIUM 100 MG PO CAPS
100.0000 mg | ORAL_CAPSULE | Freq: Two times a day (BID) | ORAL | Status: DC
  Administered 2017-07-14: 100 mg via ORAL
  Filled 2017-07-14: qty 1

## 2017-07-14 MED ORDER — BIOTIN 10000 MCG PO TBDP: 10000.0000 ug | ORAL_TABLET | Freq: Every day | ORAL | Status: DC

## 2017-07-14 MED ORDER — PROPOFOL 10 MG/ML IV BOLUS
INTRAVENOUS | Status: AC
  Filled 2017-07-14: qty 20

## 2017-07-14 MED ORDER — FENTANYL CITRATE (PF) 250 MCG/5ML IJ SOLN
INTRAMUSCULAR | Status: AC
Start: 2017-07-14 — End: ?
  Filled 2017-07-14: qty 5

## 2017-07-14 MED ORDER — HYDROMORPHONE HCL 2 MG/ML IJ SOLN
INTRAMUSCULAR | Status: AC
  Filled 2017-07-14: qty 1

## 2017-07-14 MED ORDER — ROCURONIUM BROMIDE 50 MG/5ML IV SOLN
INTRAVENOUS | Status: AC
  Filled 2017-07-14: qty 3

## 2017-07-14 MED ORDER — ALUM & MAG HYDROXIDE-SIMETH 200-200-20 MG/5ML PO SUSP: 30.0000 mL | Freq: Four times a day (QID) | ORAL | Status: DC | PRN

## 2017-07-14 MED ORDER — ONDANSETRON HCL 4 MG/2ML IJ SOLN
INTRAMUSCULAR | Status: AC
  Filled 2017-07-14: qty 2

## 2017-07-14 MED ORDER — FENOFIBRATE 160 MG PO TABS
160.0000 mg | ORAL_TABLET | Freq: Every day | ORAL | Status: DC
  Administered 2017-07-16: 160 mg via ORAL
  Filled 2017-07-14: qty 1

## 2017-07-14 MED ORDER — ACETAMINOPHEN 325 MG PO TABS: 650.0000 mg | ORAL_TABLET | ORAL | Status: DC | PRN

## 2017-07-14 MED ORDER — ACETAMINOPHEN 500 MG PO TABS
1000.0000 mg | ORAL_TABLET | Freq: Once | ORAL | Status: AC
  Administered 2017-07-14: 1000 mg via ORAL

## 2017-07-14 MED ORDER — CHLORHEXIDINE GLUCONATE 4 % EX LIQD: 60.0000 mL | Freq: Once | CUTANEOUS | Status: DC

## 2017-07-14 MED ORDER — MIDAZOLAM HCL 2 MG/2ML IJ SOLN
INTRAMUSCULAR | Status: AC
  Filled 2017-07-14: qty 2

## 2017-07-14 MED ORDER — SODIUM CHLORIDE 0.9 % IV SOLN
INTRAVENOUS | Status: AC
  Filled 2017-07-14: qty 1.2

## 2017-07-14 MED ORDER — DIAZEPAM 5 MG PO TABS
ORAL_TABLET | ORAL | Status: AC
  Filled 2017-07-14: qty 1

## 2017-07-14 MED ORDER — MORPHINE SULFATE (PF) 2 MG/ML IV SOLN
1.0000 mg | INTRAVENOUS | Status: DC | PRN
  Administered 2017-07-14 – 2017-07-15 (×5): 2 mg via INTRAVENOUS
  Filled 2017-07-14 (×5): qty 1

## 2017-07-14 MED ORDER — PROPOFOL 10 MG/ML IV BOLUS
INTRAVENOUS | Status: DC | PRN
  Administered 2017-07-14: 160 mg via INTRAVENOUS

## 2017-07-14 MED ORDER — MIDAZOLAM HCL 5 MG/5ML IJ SOLN
INTRAMUSCULAR | Status: DC | PRN
  Administered 2017-07-14: 2 mg via INTRAVENOUS

## 2017-07-14 MED ORDER — ESCITALOPRAM OXALATE 10 MG PO TABS
10.0000 mg | ORAL_TABLET | Freq: Every day | ORAL | Status: DC
  Administered 2017-07-14 – 2017-07-15 (×2): 10 mg via ORAL
  Filled 2017-07-14 (×2): qty 1

## 2017-07-14 MED ORDER — SUGAMMADEX SODIUM 200 MG/2ML IV SOLN
INTRAVENOUS | Status: DC | PRN
  Administered 2017-07-14: 167.8 mg via INTRAVENOUS

## 2017-07-14 MED ORDER — DIAZEPAM 5 MG PO TABS
5.0000 mg | ORAL_TABLET | Freq: Four times a day (QID) | ORAL | Status: DC | PRN
  Administered 2017-07-14: 5 mg via ORAL

## 2017-07-14 MED ORDER — FLEET ENEMA 7-19 GM/118ML RE ENEM: 1.0000 | ENEMA | Freq: Once | RECTAL | Status: DC | PRN

## 2017-07-14 MED ORDER — DEXMEDETOMIDINE HCL IN NACL 200 MCG/50ML IV SOLN
INTRAVENOUS | Status: AC
  Filled 2017-07-14: qty 50

## 2017-07-14 MED ORDER — HEMOSTATIC AGENTS (NO CHARGE) OPTIME
TOPICAL | Status: DC | PRN
  Administered 2017-07-14 (×2): 1 via TOPICAL

## 2017-07-14 MED ORDER — PHENOL 1.4 % MT LIQD: 1.0000 | OROMUCOSAL | Status: DC | PRN

## 2017-07-14 MED ORDER — FENTANYL CITRATE (PF) 250 MCG/5ML IJ SOLN
INTRAMUSCULAR | Status: DC | PRN
  Administered 2017-07-14: 100 ug via INTRAVENOUS
  Administered 2017-07-14 (×2): 50 ug via INTRAVENOUS

## 2017-07-14 MED ORDER — SODIUM CHLORIDE 0.9% FLUSH: 3.0000 mL | Freq: Two times a day (BID) | INTRAVENOUS | Status: DC

## 2017-07-14 MED ORDER — POVIDONE-IODINE 7.5 % EX SOLN: Freq: Once | CUTANEOUS | Status: DC

## 2017-07-14 MED ORDER — HYDROMORPHONE HCL 2 MG/ML IJ SOLN
0.5000 mg | Freq: Once | INTRAMUSCULAR | Status: AC
  Administered 2017-07-14: 0.5 mg via INTRAVENOUS

## 2017-07-14 MED ORDER — LIDOCAINE 2% (20 MG/ML) 5 ML SYRINGE
INTRAMUSCULAR | Status: DC | PRN
  Administered 2017-07-14: 80 mg via INTRAVENOUS

## 2017-07-14 MED ORDER — SODIUM CHLORIDE 0.9 % IV SOLN: 250.0000 mL | INTRAVENOUS | Status: DC

## 2017-07-14 MED ORDER — GABAPENTIN 300 MG PO CAPS
ORAL_CAPSULE | ORAL | Status: AC
  Filled 2017-07-14: qty 2

## 2017-07-14 MED ORDER — POTASSIUM CHLORIDE IN NACL 20-0.9 MEQ/L-% IV SOLN
INTRAVENOUS | Status: DC
  Administered 2017-07-14: 18:00:00 via INTRAVENOUS
  Filled 2017-07-14: qty 1000

## 2017-07-14 MED ORDER — PANTOPRAZOLE SODIUM 40 MG PO TBEC: 40.0000 mg | DELAYED_RELEASE_TABLET | Freq: Every day | ORAL | Status: DC

## 2017-07-14 MED ORDER — OXYCODONE-ACETAMINOPHEN 5-325 MG PO TABS
ORAL_TABLET | ORAL | Status: AC
  Filled 2017-07-14: qty 2

## 2017-07-14 MED ORDER — CEFAZOLIN SODIUM-DEXTROSE 2-4 GM/100ML-% IV SOLN
2.0000 g | INTRAVENOUS | Status: AC
  Administered 2017-07-14: 2 g via INTRAVENOUS
  Filled 2017-07-14: qty 100

## 2017-07-14 MED ORDER — CEFAZOLIN SODIUM-DEXTROSE 2-4 GM/100ML-% IV SOLN
2.0000 g | Freq: Three times a day (TID) | INTRAVENOUS | Status: AC
  Administered 2017-07-14 – 2017-07-15 (×2): 2 g via INTRAVENOUS
  Filled 2017-07-14 (×2): qty 100

## 2017-07-14 MED ORDER — ACETAMINOPHEN 500 MG PO TABS
ORAL_TABLET | ORAL | Status: AC
  Filled 2017-07-14: qty 2

## 2017-07-14 MED ORDER — ADAPALENE 0.1 % EX GEL: 1.0000 "application " | Freq: Every day | CUTANEOUS | Status: DC

## 2017-07-14 MED ORDER — SODIUM CHLORIDE 0.9% FLUSH: 3.0000 mL | INTRAVENOUS | Status: DC | PRN

## 2017-07-14 MED ORDER — ACETAMINOPHEN 650 MG RE SUPP: 650.0000 mg | RECTAL | Status: DC | PRN

## 2017-07-14 MED ORDER — SENNOSIDES-DOCUSATE SODIUM 8.6-50 MG PO TABS: 1.0000 | ORAL_TABLET | Freq: Every evening | ORAL | Status: DC | PRN

## 2017-07-14 MED ORDER — ZOLPIDEM TARTRATE 5 MG PO TABS: 5.0000 mg | ORAL_TABLET | Freq: Every evening | ORAL | Status: DC | PRN

## 2017-07-14 MED ORDER — DEXMEDETOMIDINE HCL IN NACL 200 MCG/50ML IV SOLN
INTRAVENOUS | Status: DC | PRN
  Administered 2017-07-14: 0.7 ug/kg/h via INTRAVENOUS

## 2017-07-14 MED ORDER — THROMBIN (RECOMBINANT) 20000 UNITS EX SOLR
CUTANEOUS | Status: AC
  Filled 2017-07-14: qty 20000

## 2017-07-14 MED ORDER — MENTHOL 3 MG MT LOZG: 1.0000 | LOZENGE | OROMUCOSAL | Status: DC | PRN

## 2017-07-14 MED ORDER — PROPOFOL 500 MG/50ML IV EMUL
INTRAVENOUS | Status: DC | PRN
  Administered 2017-07-14: 125 ug/kg/min via INTRAVENOUS

## 2017-07-14 MED ORDER — PHENYLEPHRINE 40 MCG/ML (10ML) SYRINGE FOR IV PUSH (FOR BLOOD PRESSURE SUPPORT)
PREFILLED_SYRINGE | INTRAVENOUS | Status: AC
  Filled 2017-07-14: qty 10

## 2017-07-14 MED ORDER — THROMBIN (RECOMBINANT) 20000 UNITS EX SOLR
CUTANEOUS | Status: DC | PRN
  Administered 2017-07-14: 20000 [IU] via TOPICAL

## 2017-07-14 MED ORDER — LACTATED RINGERS IV SOLN
INTRAVENOUS | Status: DC | PRN
  Administered 2017-07-14 (×3): via INTRAVENOUS

## 2017-07-14 MED ORDER — KETAMINE HCL 10 MG/ML IJ SOLN
INTRAMUSCULAR | Status: DC | PRN
  Administered 2017-07-14 (×2): 20 mg via INTRAVENOUS
  Administered 2017-07-14: 40 mg via INTRAVENOUS

## 2017-07-14 MED ORDER — ROCURONIUM BROMIDE 10 MG/ML (PF) SYRINGE
PREFILLED_SYRINGE | INTRAVENOUS | Status: DC | PRN
  Administered 2017-07-14: 50 mg via INTRAVENOUS
  Administered 2017-07-14: 30 mg via INTRAVENOUS
  Administered 2017-07-14: 20 mg via INTRAVENOUS

## 2017-07-14 MED ORDER — 0.9 % SODIUM CHLORIDE (POUR BTL) OPTIME
TOPICAL | Status: DC | PRN
  Administered 2017-07-14 (×3): 1000 mL

## 2017-07-14 MED ORDER — SUGAMMADEX SODIUM 200 MG/2ML IV SOLN
INTRAVENOUS | Status: AC
  Filled 2017-07-14: qty 2

## 2017-07-14 SURGICAL SUPPLY — 103 items
ADH SKN CLS APL DERMABOND .7 (GAUZE/BANDAGES/DRESSINGS) ×1
ADH SKN CLS LQ APL DERMABOND (GAUZE/BANDAGES/DRESSINGS) ×1
APL SKNCLS STERI-STRIP NONHPOA (GAUZE/BANDAGES/DRESSINGS) ×1
APPLIER CLIP 11 MED OPEN (CLIP) ×6
APR CLP MED 11 20 MLT OPN (CLIP) ×2
BENZOIN TINCTURE PRP APPL 2/3 (GAUZE/BANDAGES/DRESSINGS) ×2 IMPLANT
BLADE CLIPPER SURG (BLADE) IMPLANT
BLADE SURG 10 STRL SS (BLADE) ×3 IMPLANT
BONE VIVIGEN FORMABLE 10CC (Bone Implant) ×3 IMPLANT
CAGE COUGAR 16MM 15 DEG MEDIUM (Cage) ×1 IMPLANT
CAGE COUGAR MED 16 15D (Cage) ×1 IMPLANT
CLIP APPLIE 11 MED OPEN (CLIP) ×2 IMPLANT
CLIP LIGATING EXTRA MED SLVR (CLIP) ×3 IMPLANT
CLIP LIGATING EXTRA SM BLUE (MISCELLANEOUS) ×3 IMPLANT
CLOSURE STERI-STRIP 1/2X4 (GAUZE/BANDAGES/DRESSINGS) ×1
CLOSURE WOUND 1/2 X4 (GAUZE/BANDAGES/DRESSINGS)
CLSR STERI-STRIP ANTIMIC 1/2X4 (GAUZE/BANDAGES/DRESSINGS) ×1 IMPLANT
CORDS BIPOLAR (ELECTRODE) ×3 IMPLANT
COVER SURGICAL LIGHT HANDLE (MISCELLANEOUS) ×3 IMPLANT
DERMABOND ADHESIVE PROPEN (GAUZE/BANDAGES/DRESSINGS) ×2
DERMABOND ADVANCED (GAUZE/BANDAGES/DRESSINGS) ×2
DERMABOND ADVANCED .7 DNX12 (GAUZE/BANDAGES/DRESSINGS) ×1 IMPLANT
DERMABOND ADVANCED .7 DNX6 (GAUZE/BANDAGES/DRESSINGS) IMPLANT
DRAPE C-ARM 42X72 X-RAY (DRAPES) ×6 IMPLANT
DRAPE POUCH INSTRU U-SHP 10X18 (DRAPES) ×3 IMPLANT
DRAPE SURG 17X23 STRL (DRAPES) ×9 IMPLANT
DRSG MEPILEX BORDER 4X12 (GAUZE/BANDAGES/DRESSINGS) ×3 IMPLANT
DURAPREP 26ML APPLICATOR (WOUND CARE) ×3 IMPLANT
ELECT BLADE 4.0 EZ CLEAN MEGAD (MISCELLANEOUS) ×6
ELECT CAUTERY BLADE 6.4 (BLADE) ×3 IMPLANT
ELECT REM PT RETURN 9FT ADLT (ELECTROSURGICAL) ×3
ELECTRODE BLDE 4.0 EZ CLN MEGD (MISCELLANEOUS) ×2 IMPLANT
ELECTRODE REM PT RTRN 9FT ADLT (ELECTROSURGICAL) ×1 IMPLANT
FEE INTRAOP MONITOR IMPULS NCS (MISCELLANEOUS) IMPLANT
GAUZE SPONGE 4X4 12PLY STRL (GAUZE/BANDAGES/DRESSINGS) ×2 IMPLANT
GAUZE SPONGE 4X4 16PLY XRAY LF (GAUZE/BANDAGES/DRESSINGS) IMPLANT
GLOVE BIO SURGEON STRL SZ7 (GLOVE) ×3 IMPLANT
GLOVE BIO SURGEON STRL SZ8 (GLOVE) ×5 IMPLANT
GLOVE BIOGEL PI IND STRL 7.0 (GLOVE) ×1 IMPLANT
GLOVE BIOGEL PI IND STRL 8 (GLOVE) ×2 IMPLANT
GLOVE BIOGEL PI INDICATOR 7.0 (GLOVE) ×2
GLOVE BIOGEL PI INDICATOR 8 (GLOVE) ×6
GLOVE SS BIOGEL STRL SZ 7.5 (GLOVE) ×1 IMPLANT
GLOVE SUPERSENSE BIOGEL SZ 7.5 (GLOVE) ×2
GOWN EXTRA PROTECTION XL (GOWNS) ×2 IMPLANT
GOWN STRL REUS W/ TWL LRG LVL3 (GOWN DISPOSABLE) ×3 IMPLANT
GOWN STRL REUS W/ TWL XL LVL3 (GOWN DISPOSABLE) ×1 IMPLANT
GOWN STRL REUS W/TWL LRG LVL3 (GOWN DISPOSABLE) ×9
GOWN STRL REUS W/TWL XL LVL3 (GOWN DISPOSABLE) ×3
GRAFT BNE MATRIX VG FRMBL L 10 (Bone Implant) IMPLANT
HEMOSTAT SURGICEL 2X14 (HEMOSTASIS) IMPLANT
INSERT FOGARTY 61MM (MISCELLANEOUS) IMPLANT
INSERT FOGARTY SM (MISCELLANEOUS) IMPLANT
INTRAOP MONITOR FEE IMPULS NCS (MISCELLANEOUS) ×1
INTRAOP MONITOR FEE IMPULSE (MISCELLANEOUS) ×2
KIT BASIN OR (CUSTOM PROCEDURE TRAY) ×3 IMPLANT
KIT TURNOVER KIT B (KITS) ×3 IMPLANT
LOOP VESSEL MAXI BLUE (MISCELLANEOUS) IMPLANT
LOOP VESSEL MINI RED (MISCELLANEOUS) IMPLANT
MARKER SKIN DUAL TIP RULER LAB (MISCELLANEOUS) ×4 IMPLANT
NDL HYPO 25GX1X1/2 BEV (NEEDLE) ×1 IMPLANT
NDL SPNL 18GX3.5 QUINCKE PK (NEEDLE) ×1 IMPLANT
NEEDLE HYPO 25GX1X1/2 BEV (NEEDLE) ×3 IMPLANT
NEEDLE SPNL 18GX3.5 QUINCKE PK (NEEDLE) ×3 IMPLANT
NS IRRIG 1000ML POUR BTL (IV SOLUTION) ×3 IMPLANT
PACK LAMINECTOMY ORTHO (CUSTOM PROCEDURE TRAY) ×3 IMPLANT
PACK UNIVERSAL I (CUSTOM PROCEDURE TRAY) ×3 IMPLANT
PAD ARMBOARD 7.5X6 YLW CONV (MISCELLANEOUS) ×12 IMPLANT
PROBE PEDCLE PROBE MAGSTM DISP (MISCELLANEOUS) ×2 IMPLANT
SPONGE INTESTINAL PEANUT (DISPOSABLE) ×9 IMPLANT
SPONGE LAP 18X18 X RAY DECT (DISPOSABLE) ×4 IMPLANT
SPONGE LAP 4X18 X RAY DECT (DISPOSABLE) IMPLANT
SPONGE SURGIFOAM ABS GEL 100 (HEMOSTASIS) ×6 IMPLANT
STAPLER VISISTAT 35W (STAPLE) IMPLANT
STRIP CLOSURE SKIN 1/2X4 (GAUZE/BANDAGES/DRESSINGS) IMPLANT
SURGIFLO W/THROMBIN 8M KIT (HEMOSTASIS) IMPLANT
SUT MNCRL AB 4-0 PS2 18 (SUTURE) ×3 IMPLANT
SUT PDS AB 1 CTX 36 (SUTURE) ×6 IMPLANT
SUT PROLENE 4 0 RB 1 (SUTURE)
SUT PROLENE 4-0 RB1 .5 CRCL 36 (SUTURE) IMPLANT
SUT PROLENE 5 0 C 1 24 (SUTURE) IMPLANT
SUT PROLENE 5 0 CC1 (SUTURE) IMPLANT
SUT PROLENE 6 0 C 1 30 (SUTURE) ×3 IMPLANT
SUT PROLENE 6 0 CC (SUTURE) IMPLANT
SUT SILK 0 TIES 10X30 (SUTURE) ×3 IMPLANT
SUT SILK 2 0 TIES 10X30 (SUTURE) ×6 IMPLANT
SUT SILK 2 0SH CR/8 30 (SUTURE) IMPLANT
SUT SILK 3 0 TIES 10X30 (SUTURE) ×6 IMPLANT
SUT SILK 3 0SH CR/8 30 (SUTURE) IMPLANT
SUT VIC AB 1 CT1 27 (SUTURE) ×6
SUT VIC AB 1 CT1 27XBRD ANBCTR (SUTURE) ×2 IMPLANT
SUT VIC AB 1 CTX 36 (SUTURE) ×6
SUT VIC AB 1 CTX36XBRD ANBCTR (SUTURE) ×2 IMPLANT
SUT VIC AB 2-0 CT2 18 VCP726D (SUTURE) ×3 IMPLANT
SYR BULB IRRIGATION 50ML (SYRINGE) ×3 IMPLANT
SYR CONTROL 10ML LL (SYRINGE) ×2 IMPLANT
TAPE CLOTH SURG 6X10 WHT LF (GAUZE/BANDAGES/DRESSINGS) ×2 IMPLANT
TOWEL GREEN STERILE (TOWEL DISPOSABLE) ×3 IMPLANT
TOWEL OR 17X24 6PK STRL BLUE (TOWEL DISPOSABLE) ×3 IMPLANT
TOWEL OR 17X26 10 PK STRL BLUE (TOWEL DISPOSABLE) ×7 IMPLANT
TRAY FOLEY CATH SILVER 16FR (SET/KITS/TRAYS/PACK) ×3 IMPLANT
WATER STERILE IRR 1000ML POUR (IV SOLUTION) ×3 IMPLANT
YANKAUER SUCT BULB TIP NO VENT (SUCTIONS) ×3 IMPLANT

## 2017-07-14 NOTE — Anesthesia Preprocedure Evaluation (Addendum)
Anesthesia Evaluation  Patient identified by MRN, date of birth, ID band Patient awake    Reviewed: Allergy & Precautions, H&P , NPO status , Patient's Chart, lab work & pertinent test results  Airway Mallampati: II       Dental no notable dental hx. (+) Teeth Intact   Pulmonary Current Smoker,    Pulmonary exam normal breath sounds clear to auscultation       Cardiovascular Exercise Tolerance: Good negative cardio ROS Normal cardiovascular exam Rhythm:Regular Rate:Normal     Neuro/Psych Anxiety Depression negative neurological ROS     GI/Hepatic Neg liver ROS, GERD  Medicated,  Endo/Other  negative endocrine ROS  Renal/GU negative Renal ROS  negative genitourinary   Musculoskeletal   Abdominal (+) + obese,   Peds  Hematology negative hematology ROS (+)   Anesthesia Other Findings   Reproductive/Obstetrics negative OB ROS                            Anesthesia Physical  Anesthesia Plan  ASA: II  Anesthesia Plan: General   Post-op Pain Management:    Induction: Intravenous  PONV Risk Score and Plan: 3 and Ondansetron, Dexamethasone and Midazolam  Airway Management Planned: Oral ETT  Additional Equipment:   Intra-op Plan:   Post-operative Plan: Extubation in OR  Informed Consent: I have reviewed the patients History and Physical, chart, labs and discussed the procedure including the risks, benefits and alternatives for the proposed anesthesia with the patient or authorized representative who has indicated his/her understanding and acceptance.   Dental advisory given  Plan Discussed with: CRNA and Surgeon  Anesthesia Plan Comments:        Anesthesia Quick Evaluation

## 2017-07-14 NOTE — Transfer of Care (Signed)
Immediate Anesthesia Transfer of Care Note  Patient: Kathleen Stewart  Procedure(s) Performed: LUMBAR 5 - SACRUM 1 ANTERIOR LUMBAR FUSION WITH INSTRUMENTATION AND ALLOGRAFT.  TIME REQUESTED 3.5 HOURS (N/A ) ABDOMINAL EXPOSURE (N/A )  Patient Location: PACU  Anesthesia Type:General  Level of Consciousness: drowsy and patient cooperative  Airway & Oxygen Therapy: Patient Spontanous Breathing and Patient connected to nasal cannula oxygen  Post-op Assessment: Report given to RN, Post -op Vital signs reviewed and stable and Patient moving all extremities X 4  Post vital signs: Reviewed and stable  Last Vitals:  Vitals Value Taken Time  BP 108/60 07/14/2017 11:16 AM  Temp    Pulse 75 07/14/2017 11:21 AM  Resp 17 07/14/2017 11:21 AM  SpO2 100 % 07/14/2017 11:21 AM  Vitals shown include unvalidated device data.  Last Pain:  Vitals:   07/14/17 0705  TempSrc:   PainSc: 0-No pain      Patients Stated Pain Goal: 3 (07/14/17 0705)  Complications: No apparent anesthesia complications

## 2017-07-14 NOTE — OR Nursing (Signed)
Dr Jena Gauss, radiologist called with results of post op abdominal xray, stating no foreign bodies present. Provider notified.

## 2017-07-14 NOTE — Op Note (Signed)
NAME: ALLYNA, PITTSLEY MEDICAL RECORD NW:[id number] ACCOUNT [id number] DATE OF BIRTH:[birth year] FACILITY: [facility]  PHYSICIAN:Markelle Najarian Elbert Ewings Elodia Haviland, MD  OPERATIVE REPORT  DATE OF PROCEDURE:  07/14/2017  PREOPERATIVE DIAGNOSES: 1.  Bilateral L5 pars defect. 2.  Grade I L5-S1 spondylolisthesis. 3.  Bilateral lumbar radiculopathy. 4.  Low back pain.  POSTOPERATIVE DIAGNOSES:   1.  Bilateral L5 pars defect. 2.  Grade I L5-S1 spondylolisthesis. 3.  Bilateral lumbar radiculopathy. 4.  Low back pain.  PROCEDURES PERFORMED (stage 1 of 2): 1.  Anterior lumbar interbody fusion, L5-S1. 2.  Insertion of interbody device x1 (16 mm, lordotic, Cougar intervertebral spacer). 3.  Placement of anterior instrumentation. 4.  Use of morselized allograft - Vivigen. 5.  Intraoperative use of fluoroscopy. 6.  COSURGEON with Dr. Tawanna Cooler Early for the approach portion of the surgery.  SURGEON:  Estill Bamberg, MD  ASSISTANT:  Jason Coop PA-C.  ANESTHESIA:  General endotracheal anesthesia.  COMPLICATIONS:  None.  DISPOSITION:  Stable.  ESTIMATED BLOOD LOSS:  Minimal.  INDICATIONS:  Briefly, the patient is a pleasant 42 year old female who I have been following for ongoing pain in her back and bilateral legs.  Her MRI and x-rays did reveal the findings noted above.  The patient did fail appropriate conservative care,  and did continue to have ongoing pain.  Given the patient's ongoing pain and dysfunction, we did discuss proceeding with the procedure noted above.  The patient was fully aware of the risks and limitations of surgery and did wish to proceed.  DESCRIPTION OF PROCEDURE:  On 07/14/2017 the patient was brought to surgery and general endotracheal anesthesia was administered.  The patient was placed supine on a hospital bed.  The patient's abdomen was prepped and draped in the usual sterile  fashion.  An anterior approach was then performed by Dr. Gretta Began.  I did function as his cosurgeon for  the approach.  Once the appropriate retractors were placed and the appropriate level confirmed using fluoroscopy, I did proceed with the diskectomy.   I did use a knife and a series of curettes and pituitary rongeurs to perform a thorough and complete L5-S1 intervertebral diskectomy, to the level of the posterior annulus.  The endplates were then appropriately prepared.  I then placed a series of  trials and I did feel that a 16 mm spacer with 15 degrees of lordosis, medium, was the most appropriate fit.  The intervertebral spacer was then packed with the Vivigen.  I tamped into position in the usual fashion.  I was very pleased with the press fit  of the spacer.  At this point, I proceeded with the anterior instrumentation portion of the procedure.  Using a rongeur, I did remove the anterior superior aspect of the S1 vertebral body.  I did use an awl and them did advance a vertebral body screw with an  anterior fixation device into the S1 vertebral body, securing the L5-S1 intervertebral spacer into the L5-S1 intervertebral space.  I was very pleased with the final AP and lateral fluoroscopic images.  The wound was then copiously irrigated.  The  fascia was then closed using #1 PDS.  The subcutaneous layer was then closed using 2-0 Vicryl and the skin was closed using 4-0 Monocryl.  Benzoin and Steri-Strips were applied followed by sterile dressing.  All instrument counts were correct at the  termination of the procedure.  Of note, Jason Coop was my assistant throughout surgery and did aid in retraction, suctioning, and closure.  AN/NUANCE  D:07/14/2017 T:07/14/2017 JOB:[id number]

## 2017-07-14 NOTE — H&P (Signed)
PREOPERATIVE H&P  Chief Complaint: Low back pain, bilateral leg pain  HPI: Kathleen Stewart is a 42 y.o. female who presents with ongoing pain in the back and bilateral legs  MRI reveals L5/S1 DDD and spinal stenosis at L5/S1 with a bilateral L5 pars defect and a grade 1 slip  Patient has failed multiple forms of conservative care and continues to have pain (see office notes for additional details regarding the patient's full course of treatment)  Past Medical History:  Diagnosis Date  . Abdominal pain   . Abnormal Pap smear   . Anxiety   . Bloating   . Bulging lumbar disc   . Chronic back pain   . Chronic diarrhea   . Depression   . Gastroesophageal reflux   . History of abnormal cervical Pap smear 10/25/2013  . History of abnormal Pap smear 10/19/2012  . Insomnia   . Irritable bowel syndrome   . Menorrhagia 04/25/2014  . Night sweats 04/25/2014  . Pelvic pain in female 04/25/2014  . S/P ERCP   . Vaginal Pap smear, abnormal    Past Surgical History:  Procedure Laterality Date  . CHOLECYSTECTOMY N/A 06/03/2016   Procedure: LAPAROSCOPIC CHOLECYSTECTOMY WITH INTRAOPERATIVE CHOLANGIOGRAM;  Surgeon: Ancil Linsey, MD;  Location: [facility];  Service: General;  Laterality: N/A;  . COLONOSCOPY  12/24/2008   Dr. Darrick Penna: normal, normal colonic biopsies   . DILITATION & CURRETTAGE/HYSTROSCOPY WITH NOVASURE ABLATION N/A 05/22/2014   Procedure: DILATATION & CURETTAGE/HYSTEROSCOPY WITH NOVASURE ABLATION (cavity length 5.5 cm, cavity width 4.2 cm; power 127 watts, time 1 minute);  Surgeon: Tilda Burrow, MD;  Location: [facility];  Service: Gynecology;  Laterality: N/A;  . ERCP N/A 05/26/2016   Procedure: ENDOSCOPIC RETROGRADE CHOLANGIOPANCREATOGRAPHY (ERCP);  Surgeon: West Bali, MD;  Location: [facility];  Service: Endoscopy;  Laterality: N/A;  . ERCP N/A 06/02/2016   Procedure: ENDOSCOPIC RETROGRADE CHOLANGIOPANCREATOGRAPHY (ERCP);  Surgeon: West Bali, MD;  Location: [facility];  Service: Endoscopy;  Laterality: N/A;  . HYDROGEN BREATH TEST  01/29/2009  . PANCREATIC STENT PLACEMENT  05/26/2016   Procedure: PANCREATIC STENT PLACEMENT;  Surgeon: West Bali, MD;  Location: [facility];  Service: Endoscopy;;  . SPHINCTEROTOMY N/A 05/26/2016   Procedure: SPHINCTEROTOMY;  Surgeon: West Bali, MD;  Location: [facility];  Service: Endoscopy;  Laterality: N/A;  . SPHINCTEROTOMY N/A 06/02/2016   Procedure: SPHINCTEROTOMY;  Surgeon: West Bali, MD;  Location: [facility];  Service: Endoscopy;  Laterality: N/A;  . UPPER GASTROINTESTINAL ENDOSCOPY  12/24/2008   Dr. Darrick Penna: normal esophagus, mild gastritis   Social History   Socioeconomic History  . Marital status: Married    Spouse name: Not on file  . Number of children: Not on file  . Years of education: Not on file  . Highest education level: Not on file  Occupational History  . Not on file  Social Needs  . Financial resource strain: Not on file  . Food insecurity:    Worry: Not on file    Inability: Not on file  . Transportation needs:    Medical: Not on file    Non-medical: Not on file  Tobacco Use  . Smoking status: Current Every Day Smoker    Packs/day: 1.00    Years: 20.00    Pack years: 20.00    Types: Cigarettes  . Smokeless tobacco: Never Used  Substance and Sexual Activity  . Alcohol use: Yes  Comment: socially  . Drug use: No  . Sexual activity: Yes    Birth control/protection: Other-see comments    Comment: vasectomy  Lifestyle  . Physical activity:    Days per week: Not on file    Minutes per session: Not on file  . Stress: Not on file  Relationships  . Social connections:    Talks on phone: Not on file    Gets together: Not on file    Attends religious service: Not on file    Active member of club or organization: Not on file    Attends meetings of clubs or organizations: Not on file    Relationship status: Not on file  Other Topics Concern  . Not on file    Social History Narrative  . Not on file   Family History  Problem Relation Age of Onset  . Emphysema Father   . Other Mother        fibroid tumors-had hyst  . Colon cancer Neg Hx    Allergies  Allergen Reactions  . Adhesive [Tape] Dermatitis    Redness at adhesive sites   Prior to Admission medications   Medication Sig Start Date End Date Taking? Authorizing Provider  adapalene (DIFFERIN) 0.1 % gel Apply 1 application topically at bedtime.   Yes [provider]  ALPRAZolam Prudy Feeler) 1 MG tablet Take 1 mg by mouth at bedtime.   Yes [provider]  Biotin 16109 MCG TBDP Take 10,000 mcg by mouth daily.   Yes [provider]  cyclobenzaprine (FLEXERIL) 5 MG tablet Take 5 mg by mouth at bedtime.   Yes [provider]  escitalopram (LEXAPRO) 10 MG tablet Take 10 mg by mouth at bedtime.   Yes [provider]  fenofibrate 160 MG tablet Take 160 mg by mouth daily. 06/11/17  Yes [provider]  HYDROcodone-acetaminophen (NORCO) 10-325 MG tablet Take 1 tablet by mouth every 6 (six) hours as needed (for severe pain.).   Yes [provider]  ibuprofen (ADVIL,MOTRIN) 200 MG tablet Take 400 mg by mouth every 8 (eight) hours as needed (for pain/headaches). LIQUIDGELS   Yes [provider]  omeprazole (PRILOSEC) 40 MG capsule Take 40 mg by mouth daily.    Yes [provider]  traMADol (ULTRAM) 50 MG tablet Take 50-100 mg by mouth every 6 (six) hours as needed (for pain.).   Yes [provider]     All other systems have been reviewed and were otherwise negative with the exception of those mentioned in the HPI and as above.  Physical Exam: Vitals:   07/14/17 0642  BP: (!) 97/55  Pulse: 72  Resp: 20  Temp: 97.8 F (36.6 C)  SpO2: 98%    Body mass index is 31.76 kg/m.  General: Alert, no acute distress Cardiovascular: No pedal edema Respiratory: No cyanosis, no use of accessory musculature Skin: No  lesions in the area of chief complaint Neurologic: Sensation intact distally Psychiatric: Patient is competent for consent with normal mood and affect Lymphatic: No axillary or cervical lymphadenopathy   Assessment/Plan: BILATERAL LEG PAIN Plan for Procedure(s): LUMBAR 5 - SACRUM 1 ANTERIOR LUMBAR FUSION WITH INSTRUMENTATION AND ALLOGRAFT.    Emilee Hero, MD 07/14/2017 7:10 AM

## 2017-07-14 NOTE — Anesthesia Postprocedure Evaluation (Signed)
Anesthesia Post Note  Patient: Kathleen Stewart  Procedure(s) Performed: LUMBAR 5 - SACRUM 1 ANTERIOR LUMBAR FUSION WITH INSTRUMENTATION AND ALLOGRAFT.  TIME REQUESTED 3.5 HOURS (N/A ) ABDOMINAL EXPOSURE (N/A )     Patient location during evaluation: PACU Anesthesia Type: General Level of consciousness: awake and alert Pain management: pain level controlled Vital Signs Assessment: post-procedure vital signs reviewed and stable Respiratory status: spontaneous breathing, nonlabored ventilation and respiratory function stable Cardiovascular status: blood pressure returned to baseline and stable Postop Assessment: no apparent nausea or vomiting Anesthetic complications: no    Last Vitals:  Vitals:   07/14/17 1330 07/14/17 1331  BP:  111/79  Pulse: 80 79  Resp: 11 11  Temp:    SpO2: 99% 99%    Last Pain:  Vitals:   07/14/17 1300  TempSrc:   PainSc: 6                  Uniqua Kihn,W. EDMOND

## 2017-07-14 NOTE — Op Note (Signed)
    OPERATIVE REPORT  DATE OF SURGERY: 07/14/2017  PATIENT: Kathleen Stewart, 42 y.o. female MRN: [id number]  DOB: 28-Nov-1975  PRE-OPERATIVE DIAGNOSIS: Degenerative disc disease  POST-OPERATIVE DIAGNOSIS:  Same  PROCEDURE: Anterior exposure for L5-S1 disc fusion  SURGEON:  Gretta Began, M.D.  Co-surgeon for the exposure Dr. Estill Bamberg  ANESTHESIA: General  EBL: 100 ml  Total I/O In: 2200 [I.V.:2200] Out: 200 [Urine:100; Blood:100]  BLOOD ADMINISTERED: None  DRAINS: None  SPECIMEN: None  COUNTS CORRECT:  YES  PLAN OF CARE: PACU stable  PATIENT DISPOSITION:  PACU - hemodynamically stable  PROCEDURE DETAILS: Patient was taken to the operative placed in supine position where the area of the abdomen was prepped and draped in usual sterile fashion.  Crosstable lateral C arm projection was used to determine the level of the L5-S1 disc.  An incision was made low on the abdomen above the suprapubic area from the midline to the left and carried down through the subcutaneous fat with electrocautery.  The anterior rectus sheath was opened in line with the skin incision.  The rectus muscle was mobilized circumferentially.  The retina peritoneal space was entered in the left lower quadrant and the intra-abdominal contents were mobilized bluntly to the right.  The posterior rectus sheath was opened laterally and the anterior peritoneal contents were continued to be mobilized bluntly to the right.  The left ureter was identified and was mobilized to the right.  The iliac vessels were identified over the level of the L5-S1 disc.  Blunt dissection over the disc allowed exposure and mobilization of the iliac vessels.  The middle sacral vessels were clipped and divided.  The Thompson retractor was brought onto the field and the reverse lip 150 with blades were positioned to the right and left of the L5-S1 disc.  140 malleable retractors were used for superior and inferior exposure.  A spinal needle  was placed in the L5-S1 disc and C-arm was brought back onto the field and the lateral projection demonstrated this was the correct level.  The remainder of the procedure will be dictated as a separate note by Dr. Ivan Croft, M.D., [facility] 07/14/2017 12:22 PM

## 2017-07-14 NOTE — Anesthesia Procedure Notes (Signed)
Procedure Name: Intubation Date/Time: 07/14/2017 8:49 AM Performed by: Freddie Breech, CRNA Pre-anesthesia Checklist: Patient identified, Emergency Drugs available, Suction available and Patient being monitored Patient Re-evaluated:Patient Re-evaluated prior to induction Oxygen Delivery Method: Circle System Utilized Preoxygenation: Pre-oxygenation with 100% oxygen Induction Type: IV induction Ventilation: Mask ventilation without difficulty Laryngoscope Size: Mac and 3 Grade View: Grade II Tube type: Oral Tube size: 7.0 mm Number of attempts: 1 Airway Equipment and Method: Stylet and Oral airway Placement Confirmation: ETT inserted through vocal cords under direct vision,  positive ETCO2 and breath sounds checked- equal and bilateral Secured at: 21 cm Tube secured with: Tape Dental Injury: Teeth and Oropharynx as per pre-operative assessment

## 2017-07-15 ENCOUNTER — Inpatient Hospital Stay (HOSPITAL_COMMUNITY): Payer: 59

## 2017-07-15 ENCOUNTER — Inpatient Hospital Stay (HOSPITAL_COMMUNITY): Payer: 59 | Admitting: Certified Registered"

## 2017-07-15 ENCOUNTER — Inpatient Hospital Stay (HOSPITAL_COMMUNITY): Admission: RE | Admit: 2017-07-15 | Payer: 59 | Source: Ambulatory Visit | Admitting: Orthopedic Surgery

## 2017-07-15 ENCOUNTER — Encounter (HOSPITAL_COMMUNITY)
Admission: RE | Disposition: A | Discharge status: Home / Self Care | Payer: Self-pay | Source: Ambulatory Visit | Attending: Orthopedic Surgery

## 2017-07-15 LAB — GLUCOSE, CAPILLARY: GLUCOSE-CAPILLARY: 130 mg/dL — AB (ref 65–99)

## 2017-07-15 SURGERY — POSTERIOR LUMBAR FUSION 1 LEVEL
Anesthesia: General | Site: Spine Lumbar | Laterality: Bilateral

## 2017-07-15 MED ORDER — THROMBIN (RECOMBINANT) 20000 UNITS EX SOLR
CUTANEOUS | Status: DC | PRN
  Administered 2017-07-15: 20000 [IU] via TOPICAL

## 2017-07-15 MED ORDER — KETOROLAC TROMETHAMINE 30 MG/ML IJ SOLN: 30.0000 mg | Freq: Once | INTRAMUSCULAR | Status: DC | PRN

## 2017-07-15 MED ORDER — SODIUM CHLORIDE 0.9% FLUSH: 3.0000 mL | INTRAVENOUS | Status: DC | PRN

## 2017-07-15 MED ORDER — PHENOL 1.4 % MT LIQD: 1.0000 | OROMUCOSAL | Status: DC | PRN

## 2017-07-15 MED ORDER — FLEET ENEMA 7-19 GM/118ML RE ENEM: 1.0000 | ENEMA | Freq: Once | RECTAL | Status: DC | PRN

## 2017-07-15 MED ORDER — PROPOFOL 10 MG/ML IV BOLUS
INTRAVENOUS | Status: DC | PRN
  Administered 2017-07-15: 150 mg via INTRAVENOUS

## 2017-07-15 MED ORDER — MIDAZOLAM HCL 2 MG/2ML IJ SOLN: 0.5000 mg | Freq: Once | INTRAMUSCULAR | Status: DC

## 2017-07-15 MED ORDER — CEFAZOLIN SODIUM-DEXTROSE 2-4 GM/100ML-% IV SOLN
2.0000 g | Freq: Three times a day (TID) | INTRAVENOUS | Status: AC
  Administered 2017-07-15 (×2): 2 g via INTRAVENOUS
  Filled 2017-07-15 (×2): qty 100

## 2017-07-15 MED ORDER — HYDROMORPHONE HCL 2 MG/ML IJ SOLN
INTRAMUSCULAR | Status: AC
  Filled 2017-07-15: qty 1

## 2017-07-15 MED ORDER — METHYLENE BLUE 0.5 % INJ SOLN
INTRAVENOUS | Status: AC
  Filled 2017-07-15: qty 10

## 2017-07-15 MED ORDER — OXYCODONE-ACETAMINOPHEN 5-325 MG PO TABS: 1.0000 | ORAL_TABLET | ORAL | 0 refills | Status: DC | PRN

## 2017-07-15 MED ORDER — THROMBIN (RECOMBINANT) 20000 UNITS EX SOLR
CUTANEOUS | Status: AC
  Filled 2017-07-15: qty 20000

## 2017-07-15 MED ORDER — HYDROMORPHONE HCL 1 MG/ML IJ SOLN
2.0000 mg | INTRAMUSCULAR | Status: DC | PRN
  Administered 2017-07-15 – 2017-07-16 (×4): 2 mg via INTRAVENOUS
  Filled 2017-07-15 (×4): qty 2

## 2017-07-15 MED ORDER — 0.9 % SODIUM CHLORIDE (POUR BTL) OPTIME
TOPICAL | Status: DC | PRN
  Administered 2017-07-15: 1000 mL

## 2017-07-15 MED ORDER — ONDANSETRON HCL 4 MG PO TABS
4.0000 mg | ORAL_TABLET | Freq: Four times a day (QID) | ORAL | Status: DC | PRN
  Administered 2017-07-15: 4 mg via ORAL
  Filled 2017-07-15: qty 1

## 2017-07-15 MED ORDER — BUPIVACAINE LIPOSOME 1.3 % IJ SUSP
20.0000 mL | INTRAMUSCULAR | Status: AC
  Administered 2017-07-15: 20 mL
  Filled 2017-07-15: qty 20

## 2017-07-15 MED ORDER — ACETAMINOPHEN 325 MG PO TABS: 650.0000 mg | ORAL_TABLET | ORAL | Status: DC | PRN

## 2017-07-15 MED ORDER — PANTOPRAZOLE SODIUM 40 MG PO TBEC
40.0000 mg | DELAYED_RELEASE_TABLET | Freq: Every day | ORAL | Status: DC
  Administered 2017-07-15 – 2017-07-16 (×2): 40 mg via ORAL
  Filled 2017-07-15 (×2): qty 1

## 2017-07-15 MED ORDER — BUPIVACAINE-EPINEPHRINE (PF) 0.25% -1:200000 IJ SOLN
INTRAMUSCULAR | Status: AC
  Filled 2017-07-15: qty 30

## 2017-07-15 MED ORDER — DIAZEPAM 5 MG PO TABS: 5.0000 mg | ORAL_TABLET | Freq: Four times a day (QID) | ORAL | 0 refills | Status: DC | PRN

## 2017-07-15 MED ORDER — DIAZEPAM 5 MG PO TABS
5.0000 mg | ORAL_TABLET | Freq: Four times a day (QID) | ORAL | Status: DC | PRN
  Administered 2017-07-15 – 2017-07-16 (×3): 5 mg via ORAL
  Filled 2017-07-15 (×3): qty 1

## 2017-07-15 MED ORDER — POTASSIUM CHLORIDE IN NACL 20-0.9 MEQ/L-% IV SOLN
INTRAVENOUS | Status: DC
  Administered 2017-07-15: 13:00:00 via INTRAVENOUS
  Filled 2017-07-15: qty 1000

## 2017-07-15 MED ORDER — MENTHOL 3 MG MT LOZG: 1.0000 | LOZENGE | OROMUCOSAL | Status: DC | PRN

## 2017-07-15 MED ORDER — NEOSTIGMINE METHYLSULFATE 10 MG/10ML IV SOLN
INTRAVENOUS | Status: DC | PRN
  Administered 2017-07-15: 2 mg via INTRAVENOUS

## 2017-07-15 MED ORDER — FENTANYL CITRATE (PF) 100 MCG/2ML IJ SOLN
INTRAMUSCULAR | Status: DC | PRN
  Administered 2017-07-15: 150 ug via INTRAVENOUS
  Administered 2017-07-15: 50 ug via INTRAVENOUS

## 2017-07-15 MED ORDER — LIDOCAINE HCL (CARDIAC) PF 100 MG/5ML IV SOSY
PREFILLED_SYRINGE | INTRAVENOUS | Status: DC | PRN
  Administered 2017-07-15: 80 mg via INTRAVENOUS

## 2017-07-15 MED ORDER — GABAPENTIN 800 MG PO TABS: 800.0000 mg | ORAL_TABLET | Freq: Once | ORAL | Status: DC

## 2017-07-15 MED ORDER — SODIUM CHLORIDE 0.9% FLUSH
3.0000 mL | Freq: Two times a day (BID) | INTRAVENOUS | Status: DC
  Administered 2017-07-15: 3 mL via INTRAVENOUS

## 2017-07-15 MED ORDER — BISACODYL 5 MG PO TBEC: 5.0000 mg | DELAYED_RELEASE_TABLET | Freq: Every day | ORAL | Status: DC | PRN

## 2017-07-15 MED ORDER — KETAMINE HCL 100 MG/ML IJ SOLN
INTRAMUSCULAR | Status: AC
  Filled 2017-07-15: qty 1

## 2017-07-15 MED ORDER — ALUM & MAG HYDROXIDE-SIMETH 200-200-20 MG/5ML PO SUSP: 30.0000 mL | Freq: Four times a day (QID) | ORAL | Status: DC | PRN

## 2017-07-15 MED ORDER — MIDAZOLAM HCL 2 MG/2ML IJ SOLN
INTRAMUSCULAR | Status: AC
  Filled 2017-07-15: qty 2

## 2017-07-15 MED ORDER — PROPOFOL 10 MG/ML IV BOLUS
INTRAVENOUS | Status: AC
  Filled 2017-07-15: qty 40

## 2017-07-15 MED ORDER — PHENYLEPHRINE HCL 10 MG/ML IJ SOLN
INTRAMUSCULAR | Status: DC | PRN
  Administered 2017-07-15: 40 ug via INTRAVENOUS

## 2017-07-15 MED ORDER — FENTANYL CITRATE (PF) 250 MCG/5ML IJ SOLN
INTRAMUSCULAR | Status: AC
  Filled 2017-07-15: qty 5

## 2017-07-15 MED ORDER — MEPERIDINE HCL 50 MG/ML IJ SOLN: 6.2500 mg | INTRAMUSCULAR | Status: DC | PRN

## 2017-07-15 MED ORDER — BUPIVACAINE-EPINEPHRINE (PF) 0.25% -1:200000 IJ SOLN
INTRAMUSCULAR | Status: DC | PRN
  Administered 2017-07-15: 30 mL via PERINEURAL

## 2017-07-15 MED ORDER — DOCUSATE SODIUM 100 MG PO CAPS
100.0000 mg | ORAL_CAPSULE | Freq: Two times a day (BID) | ORAL | Status: DC
  Administered 2017-07-15 – 2017-07-16 (×2): 100 mg via ORAL
  Filled 2017-07-15 (×2): qty 1

## 2017-07-15 MED ORDER — GLYCOPYRROLATE 0.2 MG/ML IJ SOLN
INTRAMUSCULAR | Status: DC | PRN
  Administered 2017-07-15: .3 mg via INTRAVENOUS

## 2017-07-15 MED ORDER — SODIUM CHLORIDE 0.9 % IV SOLN: 250.0000 mL | INTRAVENOUS | Status: DC

## 2017-07-15 MED ORDER — HYDROMORPHONE HCL 2 MG/ML IJ SOLN
0.2500 mg | INTRAMUSCULAR | Status: DC | PRN
  Administered 2017-07-15 (×2): 0.5 mg via INTRAVENOUS

## 2017-07-15 MED ORDER — MORPHINE SULFATE (PF) 2 MG/ML IV SOLN
1.0000 mg | INTRAVENOUS | Status: DC | PRN
  Administered 2017-07-15: 2 mg via INTRAVENOUS
  Filled 2017-07-15: qty 1

## 2017-07-15 MED ORDER — ROCURONIUM BROMIDE 100 MG/10ML IV SOLN
INTRAVENOUS | Status: DC | PRN
  Administered 2017-07-15: 20 mg via INTRAVENOUS

## 2017-07-15 MED ORDER — HYDROMORPHONE HCL 1 MG/ML IJ SOLN
INTRAMUSCULAR | Status: DC | PRN
  Administered 2017-07-15 (×3): .4 mg via INTRAVENOUS

## 2017-07-15 MED ORDER — KETAMINE HCL 10 MG/ML IJ SOLN
INTRAMUSCULAR | Status: DC | PRN
  Administered 2017-07-15: 30 mg via INTRAVENOUS
  Administered 2017-07-15: 10 mg via INTRAVENOUS

## 2017-07-15 MED ORDER — SUCCINYLCHOLINE CHLORIDE 20 MG/ML IJ SOLN
INTRAMUSCULAR | Status: DC | PRN
  Administered 2017-07-15: 100 mg via INTRAVENOUS

## 2017-07-15 MED ORDER — ONDANSETRON HCL 4 MG/2ML IJ SOLN
INTRAMUSCULAR | Status: DC | PRN
  Administered 2017-07-15: 4 mg via INTRAVENOUS

## 2017-07-15 MED ORDER — DEXAMETHASONE SODIUM PHOSPHATE 10 MG/ML IJ SOLN
INTRAMUSCULAR | Status: DC | PRN
  Administered 2017-07-15: 5 mg via INTRAVENOUS

## 2017-07-15 MED ORDER — PROPOFOL 500 MG/50ML IV EMUL
INTRAVENOUS | Status: DC | PRN
  Administered 2017-07-15: 85 ug/kg/min via INTRAVENOUS

## 2017-07-15 MED ORDER — MIDAZOLAM HCL 5 MG/5ML IJ SOLN
INTRAMUSCULAR | Status: DC | PRN
  Administered 2017-07-15: 2 mg via INTRAVENOUS

## 2017-07-15 MED ORDER — ONDANSETRON HCL 4 MG/2ML IJ SOLN
4.0000 mg | Freq: Four times a day (QID) | INTRAMUSCULAR | Status: DC | PRN
  Administered 2017-07-16: 4 mg via INTRAVENOUS
  Filled 2017-07-15: qty 2

## 2017-07-15 MED ORDER — ACETAMINOPHEN 650 MG RE SUPP: 650.0000 mg | RECTAL | Status: DC | PRN

## 2017-07-15 MED ORDER — OXYCODONE HCL ER 20 MG PO T12A
20.0000 mg | EXTENDED_RELEASE_TABLET | Freq: Two times a day (BID) | ORAL | Status: DC
  Administered 2017-07-15 – 2017-07-16 (×3): 20 mg via ORAL
  Filled 2017-07-15 (×3): qty 1

## 2017-07-15 MED ORDER — WHITE PETROLATUM EX OINT
TOPICAL_OINTMENT | CUTANEOUS | Status: AC
  Administered 2017-07-15: 17:00:00
  Filled 2017-07-15: qty 28.35

## 2017-07-15 MED ORDER — ZOLPIDEM TARTRATE 5 MG PO TABS
5.0000 mg | ORAL_TABLET | Freq: Every evening | ORAL | Status: DC | PRN
  Filled 2017-07-15: qty 1

## 2017-07-15 MED ORDER — SENNOSIDES-DOCUSATE SODIUM 8.6-50 MG PO TABS
1.0000 | ORAL_TABLET | Freq: Every evening | ORAL | Status: DC | PRN
  Administered 2017-07-15: 1 via ORAL
  Filled 2017-07-15: qty 1

## 2017-07-15 MED ORDER — DIPHENHYDRAMINE HCL 25 MG PO CAPS
25.0000 mg | ORAL_CAPSULE | Freq: Four times a day (QID) | ORAL | Status: DC | PRN
  Administered 2017-07-15 – 2017-07-16 (×2): 25 mg via ORAL
  Filled 2017-07-15 (×2): qty 1

## 2017-07-15 MED ORDER — CEFAZOLIN SODIUM-DEXTROSE 1-4 GM/50ML-% IV SOLN
INTRAVENOUS | Status: DC | PRN
  Administered 2017-07-15: 2 g via INTRAVENOUS

## 2017-07-15 MED ORDER — LACTATED RINGERS IV SOLN
INTRAVENOUS | Status: DC | PRN
  Administered 2017-07-15: 07:00:00 via INTRAVENOUS

## 2017-07-15 MED ORDER — PROMETHAZINE HCL 25 MG/ML IJ SOLN: 6.2500 mg | INTRAMUSCULAR | Status: DC | PRN

## 2017-07-15 SURGICAL SUPPLY — 87 items
APL SKNCLS STERI-STRIP NONHPOA (GAUZE/BANDAGES/DRESSINGS) ×1
BENZOIN TINCTURE PRP APPL 2/3 (GAUZE/BANDAGES/DRESSINGS) ×3 IMPLANT
BLADE CLIPPER SURG (BLADE) IMPLANT
BONE VIVIGEN FORMABLE 1.3CC (Bone Implant) ×3 IMPLANT
BUR PRESCISION 1.7 ELITE (BURR) ×3 IMPLANT
BUR ROUND FLUTED 5 RND (BURR) ×1 IMPLANT
BUR ROUND FLUTED 5MM RND (BURR) ×1
BUR ROUND PRECISION 4.0 (BURR) IMPLANT
BUR ROUND PRECISION 4.0MM (BURR)
BUR SABER RD CUTTING 3.0 (BURR) IMPLANT
BUR SABER RD CUTTING 3.0MM (BURR)
CARTRIDGE OIL MAESTRO DRILL (MISCELLANEOUS) ×1 IMPLANT
CLOSURE WOUND 1/2 X4 (GAUZE/BANDAGES/DRESSINGS) ×1
CONT SPEC 4OZ CLIKSEAL STRL BL (MISCELLANEOUS) ×3 IMPLANT
COVER MAYO STAND STRL (DRAPES) ×6 IMPLANT
COVER SURGICAL LIGHT HANDLE (MISCELLANEOUS) ×3 IMPLANT
DIFFUSER DRILL AIR PNEUMATIC (MISCELLANEOUS) ×3 IMPLANT
DRAIN CHANNEL 15F RND FF W/TCR (WOUND CARE) IMPLANT
DRAPE C-ARM 42X72 X-RAY (DRAPES) ×3 IMPLANT
DRAPE C-ARMOR (DRAPES) IMPLANT
DRAPE POUCH INSTRU U-SHP 10X18 (DRAPES) ×3 IMPLANT
DRAPE SURG 17X23 STRL (DRAPES) ×12 IMPLANT
DURAPREP 26ML APPLICATOR (WOUND CARE) ×3 IMPLANT
ELECT BLADE 4.0 EZ CLEAN MEGAD (MISCELLANEOUS) ×3
ELECT CAUTERY BLADE 6.4 (BLADE) ×3 IMPLANT
ELECT REM PT RETURN 9FT ADLT (ELECTROSURGICAL) ×3
ELECTRODE BLDE 4.0 EZ CLN MEGD (MISCELLANEOUS) ×1 IMPLANT
ELECTRODE REM PT RTRN 9FT ADLT (ELECTROSURGICAL) ×1 IMPLANT
EVACUATOR SILICONE 100CC (DRAIN) IMPLANT
GAUZE SPONGE 4X4 12PLY STRL (GAUZE/BANDAGES/DRESSINGS) ×3 IMPLANT
GAUZE SPONGE 4X4 16PLY XRAY LF (GAUZE/BANDAGES/DRESSINGS) ×3 IMPLANT
GLOVE BIO SURGEON STRL SZ7 (GLOVE) ×3 IMPLANT
GLOVE BIO SURGEON STRL SZ8 (GLOVE) ×3 IMPLANT
GLOVE BIOGEL PI IND STRL 7.0 (GLOVE) ×1 IMPLANT
GLOVE BIOGEL PI IND STRL 8 (GLOVE) ×1 IMPLANT
GLOVE BIOGEL PI INDICATOR 7.0 (GLOVE) ×2
GLOVE BIOGEL PI INDICATOR 8 (GLOVE) ×2
GOWN STRL REUS W/ TWL LRG LVL3 (GOWN DISPOSABLE) ×2 IMPLANT
GOWN STRL REUS W/ TWL XL LVL3 (GOWN DISPOSABLE) ×1 IMPLANT
GOWN STRL REUS W/TWL LRG LVL3 (GOWN DISPOSABLE) ×6
GOWN STRL REUS W/TWL XL LVL3 (GOWN DISPOSABLE) ×3
GRAFT BNE MATRIX VG FRMBL SM 1 (Bone Implant) IMPLANT
GUIDEWIRE BLUNT VIPER II 1.45 (WIRE) ×4 IMPLANT
GUIDEWIRE SHARP VIPER II (WIRE) ×10 IMPLANT
IV CATH 14GX2 1/4 (CATHETERS) ×3 IMPLANT
KIT BASIN OR (CUSTOM PROCEDURE TRAY) ×3 IMPLANT
KIT POSITION SURG JACKSON T1 (MISCELLANEOUS) ×3 IMPLANT
KIT TURNOVER KIT B (KITS) ×3 IMPLANT
MARKER SKIN DUAL TIP RULER LAB (MISCELLANEOUS) ×3 IMPLANT
NDL HYPO 25GX1X1/2 BEV (NEEDLE) ×1 IMPLANT
NDL JAMSHIDI VIPER (NEEDLE) IMPLANT
NDL SAFETY ECLIPSE 18X1.5 (NEEDLE) ×1 IMPLANT
NDL SPNL 18GX3.5 QUINCKE PK (NEEDLE) ×2 IMPLANT
NEEDLE 22X1 1/2 (OR ONLY) (NEEDLE) ×6 IMPLANT
NEEDLE HYPO 18GX1.5 SHARP (NEEDLE) ×3
NEEDLE HYPO 25GX1X1/2 BEV (NEEDLE) ×3 IMPLANT
NEEDLE JAMSHIDI VIPER (NEEDLE) ×6 IMPLANT
NEEDLE SPNL 18GX3.5 QUINCKE PK (NEEDLE) ×6 IMPLANT
NS IRRIG 1000ML POUR BTL (IV SOLUTION) ×3 IMPLANT
OIL CARTRIDGE MAESTRO DRILL (MISCELLANEOUS) ×3
PACK LAMINECTOMY ORTHO (CUSTOM PROCEDURE TRAY) ×3 IMPLANT
PACK UNIVERSAL I (CUSTOM PROCEDURE TRAY) ×5 IMPLANT
PAD ARMBOARD 7.5X6 YLW CONV (MISCELLANEOUS) ×6 IMPLANT
PATTIES SURGICAL .5 X1 (DISPOSABLE) ×3 IMPLANT
PATTIES SURGICAL .5X1.5 (GAUZE/BANDAGES/DRESSINGS) ×3 IMPLANT
ROD VIPER II LORDOSED 5.5X40 (Rod) ×4 IMPLANT
SCREW POLY VIPER2 7X40MM (Screw) ×8 IMPLANT
SCREW SET SINGLE INNER MIS (Screw) ×8 IMPLANT
SPONGE INTESTINAL PEANUT (DISPOSABLE) ×3 IMPLANT
SPONGE SURGIFOAM ABS GEL 100 (HEMOSTASIS) ×3 IMPLANT
STRIP CLOSURE SKIN 1/2X4 (GAUZE/BANDAGES/DRESSINGS) ×3 IMPLANT
SURGIFLO W/THROMBIN 8M KIT (HEMOSTASIS) IMPLANT
SUT MNCRL AB 4-0 PS2 18 (SUTURE) ×3 IMPLANT
SUT VIC AB 0 CT1 18XCR BRD 8 (SUTURE) ×1 IMPLANT
SUT VIC AB 0 CT1 8-18 (SUTURE) ×3
SUT VIC AB 1 CT1 18XCR BRD 8 (SUTURE) ×1 IMPLANT
SUT VIC AB 1 CT1 8-18 (SUTURE) ×3
SUT VIC AB 2-0 CT2 18 VCP726D (SUTURE) ×3 IMPLANT
SYR 20CC LL (SYRINGE) ×6 IMPLANT
SYR BULB IRRIGATION 50ML (SYRINGE) ×3 IMPLANT
SYR CONTROL 10ML LL (SYRINGE) ×6 IMPLANT
SYR TB 1ML LUER SLIP (SYRINGE) ×3 IMPLANT
TAP CANN VIPER2 DL 6.0 (TAP) ×4 IMPLANT
TAPE CLOTH SURG 4X10 WHT LF (GAUZE/BANDAGES/DRESSINGS) ×2 IMPLANT
TRAY FOLEY MTR SLVR 16FR STAT (SET/KITS/TRAYS/PACK) ×3 IMPLANT
WATER STERILE IRR 1000ML POUR (IV SOLUTION) ×3 IMPLANT
YANKAUER SUCT BULB TIP NO VENT (SUCTIONS) ×3 IMPLANT

## 2017-07-15 NOTE — Anesthesia Procedure Notes (Signed)
Procedure Name: Intubation Date/Time: 07/15/2017 7:38 AM Performed by: Cleda Daub, CRNA Pre-anesthesia Checklist: Patient identified, Emergency Drugs available, Patient being monitored and Suction available Patient Re-evaluated:Patient Re-evaluated prior to induction Oxygen Delivery Method: Circle system utilized Preoxygenation: Pre-oxygenation with 100% oxygen Induction Type: IV induction Ventilation: Mask ventilation without difficulty and Mask ventilation throughout procedure Laryngoscope Size: Mac and 3 Grade View: Grade I Tube type: Oral Tube size: 7.0 mm Number of attempts: 1 Airway Equipment and Method: Stylet Placement Confirmation: ETT inserted through vocal cords under direct vision,  positive ETCO2 and breath sounds checked- equal and bilateral Secured at: 23 cm Tube secured with: Tape Dental Injury: Teeth and Oropharynx as per pre-operative assessment

## 2017-07-15 NOTE — Progress Notes (Addendum)
Pt remained NPO for Surgery This am CHG bathe done, report given to CRNA Banks  Last void at 05000. Vs stable. Pt awaiting for pick up  Spouse at bedside, with pt belongings

## 2017-07-15 NOTE — Progress Notes (Signed)
Pt sent down Via bed  By a transporter from [facility].

## 2017-07-15 NOTE — Transfer of Care (Signed)
Immediate Anesthesia Transfer of Care Note  Patient: Kathleen Stewart  Procedure(s) Performed: LUMBAR 5 - SACRUM 1 POSTERIOR SPINAL FUSION WITH INSTRUMENTATION AND ALLOGRAFT.  TIME REQUESTED 2.5 HOURS (Bilateral Spine Lumbar)  Patient Location: PACU  Anesthesia Type:General  Level of Consciousness: awake, alert , oriented and patient cooperative  Airway & Oxygen Therapy: Patient Spontanous Breathing and Patient connected to face mask oxygen  Post-op Assessment: Report given to RN and Post -op Vital signs reviewed and stable  Post vital signs: Reviewed and stable  Last Vitals:  Vitals Value Taken Time  BP 118/72 07/15/2017  9:48 AM  Temp    Pulse 92 07/15/2017  9:51 AM  Resp    SpO2 100 % 07/15/2017  9:51 AM  Vitals shown include unvalidated device data.  Last Pain:  Vitals:   07/15/17 0620  TempSrc:   PainSc: 10-Worst pain ever      Patients Stated Pain Goal: 4 (07/15/17 1610)  Complications: No apparent anesthesia complications

## 2017-07-15 NOTE — H&P (Signed)
Patient tolerated stage 1 of her procedure yesterday well. Patient presents for stage 2 today, an L5/S1 PSF with instrumentation and allograft. Will proceed as planned.

## 2017-07-15 NOTE — Op Note (Signed)
DATE OF PROCEDURE: 07/15/2017   OPERATIVE REPORT   PREOPERATIVE DIAGNOSES: 1.  Bilateral L5 pars defect. 2.  Grade I L5-S1 spondylolisthesis. 3.  Bilateral lumbar radiculopathy. 4.  Low back pain. 5.  S/p L5/S1 Anterior Lumbar fusion requiring posterior fusion and instrumentation  POSTOPERATIVE DIAGNOSES:   1.  Bilateral L5 pars defect. 2.  Grade I L5-S1 spondylolisthesis. 3.  Bilateral lumbar radiculopathy. 4.  Low back pain. 5.  S/p L5/S1 Anterior Lumbar fusion requiring posterior fusion and instrumentation   PROCEDURE (Stage 2): 1. Posterior spinal fusion, L5-S1. 2. Placement of posterior segmental instrumentation, L5, S1. 3. Use of morselized allograft-Vivagen. 4. Intraoperative use of floroscopy  SURGEON: Estill Bamberg, MD  ASSISTANT: Jason Coop, PA-C  ANESTHESIA: General endotracheal anesthesia.  COMPLICATIONS: None.  DISPOSITION: Stable.  ESTIMATED BLOOD LOSS: Minimal  INDICATIONS FOR SURGERY:Briefly, Kathleen Stewart is one day status post an anterior lumbar fusion at the L5-S1 level.  Please refer to my operative report dated 07/14/2017, for a full account of the patient's preoperative history and indications for surgery.  The patient did present today for stage 2 of what was to be a 2-staged procedure.  OPERATIVE DETAILS: On 07/15/2017, the patient was brought to surgery and general endotracheal anesthesia was administered. The patient was placed prone onto a Jackson spinal bed. The back was then prepped and draped in the usual sterile fashion. I then made paramedian incisions on the right and left sides, just lateral to the lateral borders of the pedicles from L5-S1. On the left side, the posterolateral gutter and posterior elements were identified and exposed and decorticated. Vivigen was packed into the posterolateral gutter on the left side to aid in the success of the posterior fusion. I then tapped the L5, and S1  pedicles bilaterally using a 6 mm tap. Of note, I did use neurologic monitoring and I did test each of the taps using triggered EMG. There was no tap that tested below 12 milliamps. I then placed 7 x 40 mm screws bilaterally at S1 and 7 x 40 mm screws bilaterally at L5. Rods were then secured into the tulip heads of the screws bilaterally. Caps were then placed and a final locking procedure was performed. I was very pleased with the final AP and lateral fluoroscopic images. The wound was then copiously irrigated. On the right and left sides, the fascia was closed using #1 Vicryl. The subcutaneous layer was closed using 0 Vicryl followed by 2-0 Vicryl, and the skin was then closed using 4-0 Monocryl. Benzoin and Steri-Strips were applied followed by sterile dressing. All instrument counts were correct at the termination of the procedure. Again, I did use neurologic monitoring throughout the surgery, and there was no abnormal EMG activity noted throughout the surgery.  Of note, Jason Coop was my assistant throughout surgery, and did aid in retraction, suctioning, and closure for both the anterior and posterior portions of the procedure.     Estill Bamberg, MD

## 2017-07-15 NOTE — Evaluation (Signed)
Physical Therapy Evaluation Patient Details Name: Kathleen Stewart MRN: [id number] DOB: Dec 11, 1975 Today's Date: 07/15/2017   History of Present Illness  Pt. is a 42 y.o. F s/p L5/S1 anterior lumbar fusion (stage 1) and posterior fusion and instrumentation (stage 2).  Clinical Impression  Patient is s/p above surgery resulting in the deficits listed below (see PT Problem List). Patient able to ambulate in room with and without RW. Patient preferring ambulation with no device, and while she did have slow and guarded gait, she did not display any diminished balance throughout session. Needs reinforcement of maintaining precautions with ADL's and mobility as well as stair training to prepare for discharge home. Suspect patient will progress well based on age, motivation and PLOF. Patient will benefit from skilled PT to increase their independence and safety with mobility (while adhering to their precautions) to allow discharge to the venue listed below.      Follow Up Recommendations Home health PT;Supervision for mobility/OOB    Equipment Recommendations  None recommended by PT    Recommendations for Other Services       Precautions / Restrictions Precautions Precautions: Back Precaution Booklet Issued: Yes (comment) Precaution Comments: Recalled 2/3 precautions. needed reminder not to rotate when flushing toilet Required Braces or Orthoses: Spinal Brace Spinal Brace: Thoracolumbosacral orthotic;Applied in sitting position;Other (comment)(see orders) Restrictions Weight Bearing Restrictions: No      Mobility  Bed Mobility Overal bed mobility: Modified Independent             General bed mobility comments: Increased time for supine <> sit with instructions for log roll technique. Patient stating, "wow that's so much easier than the way I got up earlier."  Transfers Overall transfer level: Needs assistance Equipment used: None Transfers: Sit to/from Stand Sit to Stand:  Supervision            Ambulation/Gait Ambulation/Gait assistance: Supervision Ambulation Distance (Feet): 20 Feet Assistive device: None;Rolling walker (2 wheeled) Gait Pattern/deviations: Step-through pattern;Decreased stride length Gait velocity: decreased   General Gait Details: Patient with slow and guarded gait. Prefers no AD and no apparent imbalance.  Stairs            Wheelchair Mobility    Modified Rankin (Stroke Patients Only)       Balance Overall balance assessment: No apparent balance deficits (not formally assessed)                                           Pertinent Vitals/Pain Pain Assessment: 0-10 Pain Score: 2  Pain Location: surgical site Pain Descriptors / Indicators: Discomfort;Guarding Pain Intervention(s): Monitored during session    Home Living Family/patient expects to be discharged to:: Private residence Living Arrangements: Spouse/significant other;Other relatives(mom) Available Help at Discharge: Family Type of Home: House Home Access: Stairs to enter Entrance Stairs-Rails: None Entrance Stairs-Number of Steps: 3 Home Layout: Two level;Able to live on main level with bedroom/bathroom Home Equipment: Shower seat      Prior Function Level of Independence: Independent         Comments: Works in [employer]   Dominant Hand: Right    Extremity/Trunk Assessment   Upper Extremity Assessment Upper Extremity Assessment: Overall WFL for tasks assessed    Lower Extremity Assessment Lower Extremity Assessment: Overall WFL for tasks assessed    Cervical / Trunk Assessment Cervical / Trunk Assessment: Normal  Communication   Communication: No difficulties  Cognition Arousal/Alertness: Awake/alert Behavior During Therapy: WFL for tasks assessed/performed Overall Cognitive Status: Within Functional Limits for tasks assessed                                        General  Comments General comments (skin integrity, edema, etc.): Reviewed/demonstrated car transfer and maintaining precautions with ADL's    Exercises     Assessment/Plan    PT Assessment Patient needs continued PT services  PT Problem List Decreased mobility;Pain;Decreased knowledge of precautions       PT Treatment Interventions DME instruction;Stair training;Gait training;Functional mobility training;Therapeutic activities;Balance training;Therapeutic exercise;Neuromuscular re-education;Patient/family education    PT Goals (Current goals can be found in the Care Plan section)  Acute Rehab PT Goals Patient Stated Goal: no pain PT Goal Formulation: With patient/family Time For Goal Achievement: 07/20/17 Potential to Achieve Goals: Good    Frequency Min 5X/week   Barriers to discharge        Co-evaluation               AM-PAC PT "6 Clicks" Daily Activity  Outcome Measure Difficulty turning over in bed (including adjusting bedclothes, sheets and blankets)?: A Little Difficulty moving from lying on back to sitting on the side of the bed? : A Little Difficulty sitting down on and standing up from a chair with arms (e.g., wheelchair, bedside commode, etc,.)?: A Little Help needed moving to and from a bed to chair (including a wheelchair)?: A Little Help needed walking in hospital room?: A Little Help needed climbing 3-5 steps with a railing? : A Little 6 Click Score: 18    End of Session Equipment Utilized During Treatment: Back brace Activity Tolerance: Patient tolerated treatment well Patient left: in bed;with bed alarm set;with family/visitor present Nurse Communication: Mobility status PT Visit Diagnosis: Pain;Difficulty in walking, not elsewhere classified (R26.2) Pain - part of body: (back)    Time: 9604-5409 PT Time Calculation (min) (ACUTE ONLY): 24 min   Charges:   PT Evaluation $PT Eval Low Complexity: 1 Low PT Treatments $Therapeutic Activity: 8-22 mins    PT G Codes:        Kathleen Stewart, PT, DPT Acute Rehabilitation Services  Pager: [phone]   Kathleen Stewart 07/15/2017, 5:39 PM

## 2017-07-15 NOTE — Anesthesia Postprocedure Evaluation (Signed)
Anesthesia Post Note  Patient: Kathleen Stewart  Procedure(s) Performed: LUMBAR 5 - SACRUM 1 POSTERIOR SPINAL FUSION WITH INSTRUMENTATION AND ALLOGRAFT.  TIME REQUESTED 2.5 HOURS (Bilateral Spine Lumbar)     Patient location during evaluation: PACU Anesthesia Type: General Level of consciousness: awake and sedated Pain management: pain level controlled Vital Signs Assessment: post-procedure vital signs reviewed and stable Respiratory status: spontaneous breathing Cardiovascular status: stable Postop Assessment: no apparent nausea or vomiting Anesthetic complications: no    Last Vitals:  Vitals:   07/15/17 0949 07/15/17 1045  BP:    Pulse:    Resp:    Temp: (!) 36.4 C (!) 36.3 C  SpO2:      Last Pain:  Vitals:   07/15/17 1045  TempSrc:   PainSc: 6    Pain Goal: Patients Stated Pain Goal: 4 (07/15/17 0620)               Richmond Coldren JR,JOHN Susann Givens

## 2017-07-15 NOTE — Progress Notes (Signed)
Patient ID: Kathleen Stewart, female   DOB: [birth year], 42 y.o.   MRN: [id number] In holding area currently.  For second stage today from a posterior approach.  Reports a significant back pain last night unable to get comfortable. Reports abdominal soreness.  Did have some nausea yesterday evening after anesthesia but this is resolved. Palpable dorsalis pedis pulses bilaterally y. Will not follow actively.  Please call if we can assist.

## 2017-07-16 ENCOUNTER — Encounter (HOSPITAL_COMMUNITY): Payer: Self-pay | Admitting: Orthopedic Surgery

## 2017-07-16 ENCOUNTER — Other Ambulatory Visit: Payer: Self-pay

## 2017-07-16 MED ORDER — OXYCODONE HCL ER 20 MG PO T12A: 20.0000 mg | EXTENDED_RELEASE_TABLET | Freq: Two times a day (BID) | ORAL | 0 refills | Status: DC

## 2017-07-16 NOTE — Care Management Note (Signed)
Case Management Note  Patient Details  Name: Kathleen Stewart MRN: [id number] Date of Birth: 03/20/1975  Subjective/Objective:       Pt s/p lumbar fusion. She is from home with spouse.             Action/Plan: PT recommending HH services. CM spoke to the patient and she doesn't feel she needs these services.  Pt has PCP, insurance and transportation home.   Expected Discharge Date:  07/16/17               Expected Discharge Plan:  Home/Self Care  In-House Referral:     Discharge planning Services     Post Acute Care Choice:    Choice offered to:     DME Arranged:    DME Agency:     HH Arranged:    HH Agency:     Status of Service:  Completed, signed off  If discussed at [employer] of Stay Meetings, dates discussed:    Additional Comments:  Kermit Balo, RN 07/16/2017, 10:07 AM

## 2017-07-16 NOTE — Progress Notes (Signed)
Physical Therapy Treatment Patient Details Name: Kathleen Stewart MRN: [id number] DOB: [birth year] Today's Date: 07/16/2017    History of Present Illness Pt. is a 42 y.o. F s/p L5/S1 anterior lumbar fusion (stage 1) and posterior fusion and instrumentation (stage 2).    PT Comments    Patient is making good progress with PT.  From a mobility standpoint anticipate patient will be ready for DC home when medically ready.    Follow Up Recommendations  Home health PT;Supervision for mobility/OOB     Equipment Recommendations  None recommended by PT    Recommendations for Other Services       Precautions / Restrictions Precautions Precautions: Back Precaution Booklet Issued: Yes (comment) Precaution Comments: 3/3 precautions reviewed with pt Required Braces or Orthoses: Spinal Brace Spinal Brace: Thoracolumbosacral orthotic;Applied in sitting position;Other (comment)(see orders) Restrictions Weight Bearing Restrictions: No    Mobility  Bed Mobility               General bed mobility comments: pt sitting EOB upon arrival  Transfers Overall transfer level: Modified independent Equipment used: None Transfers: Sit to/from Stand              Ambulation/Gait Ambulation/Gait assistance: Supervision Ambulation Distance (Feet): 200 Feet Assistive device: None Gait Pattern/deviations: Step-through pattern;Decreased stride length Gait velocity: decreased   General Gait Details: cues for increased bilat step lengths; no unsteadiness noted   Stairs Stairs: Yes Stairs assistance: Supervision Stair Management: No rails;Step to pattern;Forwards Number of Stairs: 2 General stair comments: cues for sequencing; supervision for safety; no physical assist needed   Wheelchair Mobility    Modified Rankin (Stroke Patients Only)       Balance Overall balance assessment: No apparent balance deficits (not formally assessed)                                           Cognition Arousal/Alertness: Awake/alert Behavior During Therapy: WFL for tasks assessed/performed Overall Cognitive Status: Within Functional Limits for tasks assessed                                        Exercises      General Comments        Pertinent Vitals/Pain Pain Assessment: Faces Faces Pain Scale: Hurts a little bit Pain Location: surgical site Pain Descriptors / Indicators: Discomfort;Guarding Pain Intervention(s): Limited activity within patient's tolerance;Monitored during session;Premedicated before session;Repositioned    Home Living                      Prior Function            PT Goals (current goals can now be found in the care plan section) Acute Rehab PT Goals Patient Stated Goal: no pain PT Goal Formulation: With patient/family Time For Goal Achievement: 07/20/17 Potential to Achieve Goals: Good Progress towards PT goals: Progressing toward goals    Frequency    Min 5X/week      PT Plan Current plan remains appropriate    Co-evaluation              AM-PAC PT "6 Clicks" Daily Activity  Outcome Measure  Difficulty turning over in bed (including adjusting bedclothes, sheets and blankets)?: A Little Difficulty moving from lying on back to sitting on the  side of the bed? : A Little Difficulty sitting down on and standing up from a chair with arms (e.g., wheelchair, bedside commode, etc,.)?: A Little Help needed moving to and from a bed to chair (including a wheelchair)?: A Little Help needed walking in hospital room?: A Little Help needed climbing 3-5 steps with a railing? : A Little 6 Click Score: 18    End of Session Equipment Utilized During Treatment: Back brace Activity Tolerance: Patient tolerated treatment well Patient left: with bed alarm set;with family/visitor present(pt sitting EOB) Nurse Communication: Mobility status PT Visit Diagnosis: Pain;Difficulty in walking, not elsewhere  classified (R26.2) Pain - part of body: (back)     Time: 6962-9528 PT Time Calculation (min) (ACUTE ONLY): 13 min  Charges:  $Gait Training: 8-22 mins                    G Codes:       Erline Levine, PTA Pager: [phone]     Carolynne Edouard 07/16/2017, 8:54 AM

## 2017-07-16 NOTE — Evaluation (Signed)
Occupational Therapy Evaluation Patient Details Name: Kathleen Stewart MRN: [id number] DOB: 1975-06-30 Today's Date: 07/16/2017    History of Present Illness Pt. is a 42 y.o. F s/p L5/S1 anterior lumbar fusion (stage 1) and posterior fusion and instrumentation (stage 2).   Clinical Impression   PTA Pt independent in ADL/IADL and mobility - driving, works full time in [employer]. PT is currently mod I for ADL (including donning brace). Back handout provided and reviewed adls in detail. Pt educated on: clothing between brace, never sleep in brace, set an alarm at night for medication, avoid sitting for long periods of time, correct bed positioning for sleeping, correct sequence for bed mobility, avoiding lifting more than 5 pounds and never wash directly over incision. Toilet aide and long handle sponge reviewed for AE. All education is complete and patient indicates understanding. OT to sign off at this time. Thank you for the opportunity to serve this patient.       Follow Up Recommendations  No OT follow up;Supervision - Intermittent    Equipment Recommendations  3 in 1 bedside commode;Other (comment)(toilet aide, long handle sponge)    Recommendations for Other Services       Precautions / Restrictions Precautions Precautions: Back Precaution Booklet Issued: Yes (comment) Precaution Comments: 3/3 precautions reviewed with pt Required Braces or Orthoses: Spinal Brace Spinal Brace: Thoracolumbosacral orthotic;Applied in sitting position;Other (comment)(see orders) Restrictions Weight Bearing Restrictions: No      Mobility Bed Mobility               General bed mobility comments: pt sitting EOB upon arrival  Transfers Overall transfer level: Modified independent Equipment used: None Transfers: Sit to/from Stand                Balance Overall balance assessment: No apparent balance deficits (not formally assessed)                                          ADL either performed or assessed with clinical judgement   ADL Overall ADL's : Modified independent                                       General ADL Comments: Pt able to bring feet cross to knees for LB ADL - educated on brace don/doff, Pt able to dress herself with no assist from therapist, educated and maintained precautions/compensatory techniques; also educated on setting grooming items up on right to prevent twisting and toilet tongs for rear peri care     Vision Baseline Vision/History: Wears glasses Wears Glasses: At all times Patient Visual Report: No change from baseline       Perception     Praxis      Pertinent Vitals/Pain Pain Assessment: Faces Pain Score: 2  Faces Pain Scale: Hurts a little bit Pain Location: surgical site Pain Descriptors / Indicators: Discomfort;Guarding Pain Intervention(s): Limited activity within patient's tolerance;Monitored during session     Hand Dominance Right   Extremity/Trunk Assessment Upper Extremity Assessment Upper Extremity Assessment: Overall WFL for tasks assessed   Lower Extremity Assessment Lower Extremity Assessment: Overall WFL for tasks assessed   Cervical / Trunk Assessment Cervical / Trunk Assessment: Other exceptions Cervical / Trunk Exceptions: s/p sx   Communication Communication Communication: No difficulties   Cognition Arousal/Alertness: Awake/alert Behavior  During Therapy: WFL for tasks assessed/performed Overall Cognitive Status: Within Functional Limits for tasks assessed                                     General Comments       Exercises     Shoulder Instructions      Home Living Family/patient expects to be discharged to:: Private residence Living Arrangements: Spouse/significant other;Parent Available Help at Discharge: Family Type of Home: House Home Access: Stairs to enter [employer] of Steps: 3 Entrance Stairs-Rails: None Home Layout:  Two level;Able to live on main level with bedroom/bathroom     Bathroom Shower/Tub: Producer, television/film/video: Standard     Home Equipment: Shower seat          Prior Functioning/Environment Level of Independence: Independent        Comments: Works in [employer]        OT Problem List:        OT Treatment/Interventions:      OT Goals(Current goals can be found in the care plan section) Acute Rehab OT Goals Patient Stated Goal: no pain OT Goal Formulation: With patient Time For Goal Achievement: 07/30/17 Potential to Achieve Goals: Good  OT Frequency:     Barriers to D/C:            Co-evaluation              AM-PAC PT "6 Clicks" Daily Activity     Outcome Measure Help from another person eating meals?: None Help from another person taking care of personal grooming?: None Help from another person toileting, which includes using toliet, bedpan, or urinal?: A Little Help from another person bathing (including washing, rinsing, drying)?: None Help from another person to put on and taking off regular upper body clothing?: A Little Help from another person to put on and taking off regular lower body clothing?: A Little 6 Click Score: 21   End of Session Equipment Utilized During Treatment: Back brace Nurse Communication: Mobility status  Activity Tolerance: Patient tolerated treatment well Patient left: in chair;with family/visitor present                   Time: 2956-2130 OT Time Calculation (min): 18 min Charges:  OT General Charges $OT Visit: 1 Visit OT Evaluation $OT Eval Low Complexity: 1 Low G-Codes:     Sherryl Manges OTR/L [phone]  Evern Bio Toivo Bordon 07/16/2017, 10:27 AM

## 2017-07-16 NOTE — Plan of Care (Signed)
Patient discharged to home with her husband this morning.  She ambulates in the hall regularly, wears her brace appropriately and is able to teach back pain management, dressing/wound care and post-surgical precautions.  Pt discharged with hard scripts in hand.

## 2017-07-20 MED FILL — Heparin Sodium (Porcine) Inj 1000 Unit/ML: INTRAMUSCULAR | Qty: 30 | Status: AC

## 2017-07-20 MED FILL — Sodium Chloride IV Soln 0.9%: INTRAVENOUS | Qty: 1000 | Status: AC

## 2017-07-22 NOTE — Discharge Summary (Signed)
Patient ID: [name] MRN: [id number] DOB/AGE: 43-Jul-1977 43 y.o.  Admit date: 07/14/2017 Discharge date: 07/16/2017  Admission Diagnoses:  Active Problems:   Radiculopathy   Discharge Diagnoses:  Same  Past Medical History:  Diagnosis Date  . Abdominal pain   . Abnormal Pap smear   . Anxiety   . Bloating   . Bulging lumbar disc   . Chronic back pain   . Chronic diarrhea   . Depression   . Gastroesophageal reflux   . History of abnormal cervical Pap smear 10/25/2013  . History of abnormal Pap smear 10/19/2012  . Insomnia   . Irritable bowel syndrome   . Menorrhagia 04/25/2014  . Night sweats 04/25/2014  . Pelvic pain in female 04/25/2014  . S/P ERCP   . Vaginal Pap smear, abnormal     Surgeries: Procedure(s): LUMBAR 5 - SACRUM 1 POSTERIOR SPINAL FUSION WITH INSTRUMENTATION AND ALLOGRAFT.  TIME REQUESTED 2.5 HOURS on 07/15/2017   Consultants: Treatment Team:  Larina Earthly, MD  Discharged Condition: Improved  Hospital Course: Kathleen Stewart is an 42 y.o. female who was admitted 07/14/2017 for operative treatment of radiculopathy. Patient has severe unremitting pain that affects sleep, daily activities, and work/hobbies. After pre-op clearance the patient was taken to the operating room on 07/15/2017 and underwent  Procedure(s): LUMBAR 5 - SACRUM 1 POSTERIOR SPINAL FUSION WITH INSTRUMENTATION AND ALLOGRAFT.  TIME REQUESTED 2.5 HOURS.    Patient was given perioperative antibiotics:  Anti-infectives (From admission, onward)   Start     Dose/Rate Route Frequency Ordered Stop   07/15/17 1600  ceFAZolin (ANCEF) IVPB 2g/100 mL premix     2 g 200 mL/hr over 30 Minutes Intravenous Every 8 hours 07/15/17 1112 07/16/17 0000   07/14/17 1800  ceFAZolin (ANCEF) IVPB 2g/100 mL premix     2 g 200 mL/hr over 30 Minutes Intravenous Every 8 hours 07/14/17 1627 07/15/17 0357   07/14/17 0645  ceFAZolin (ANCEF) IVPB 2g/100 mL premix     2 g 200 mL/hr over 30 Minutes Intravenous  On call to O.R. 07/14/17 9147 07/14/17 0908       Patient was given sequential compression devices, early ambulation to prevent DVT.  Patient benefited maximally from hospital stay and there were no complications.    Recent vital signs: BP 111/66   Pulse 74   Temp 98.1 F (36.7 C) (Oral)   Resp 18   Ht 5' 4.02" (1.626 m)   Wt 83.9 kg (185 lb)   SpO2 100%   BMI 31.74 kg/m   Discharge Medications:   Allergies as of 07/16/2017      Reactions   Adhesive [tape] Dermatitis   Redness at adhesive sites      Medication List    TAKE these medications   adapalene 0.1 % gel Commonly known as:  DIFFERIN Apply 1 application topically at bedtime.   ALPRAZolam 1 MG tablet Commonly known as:  XANAX Take 1 mg by mouth at bedtime.   Biotin 82956 MCG Tbdp Take 10,000 mcg by mouth daily.   diazepam 5 MG tablet Commonly known as:  VALIUM Take 1 tablet (5 mg total) by mouth every 6 (six) hours as needed for muscle spasms.   escitalopram 10 MG tablet Commonly known as:  LEXAPRO Take 10 mg by mouth at bedtime.   fenofibrate 160 MG tablet Take 160 mg by mouth daily.   omeprazole 40 MG capsule Commonly known as:  PRILOSEC Take 40 mg by mouth  daily.   oxyCODONE 20 mg 12 hr tablet Commonly known as:  OXYCONTIN Take 1 tablet (20 mg total) by mouth every 12 (twelve) hours. Notes to patient:  Patient and Caregiver should monitor respiratory status.  Call 911 for respirations less than 12 breaths per minute   oxyCODONE-acetaminophen 5-325 MG tablet Commonly known as:  PERCOCET/ROXICET Take 1-2 tablets by mouth every 4 (four) hours as needed for moderate pain or severe pain.       Diagnostic Studies: Dg Chest 2 View  Result Date: 07/13/2017 CLINICAL DATA:  Preoperative evaluation for lumbar fusion EXAM: CHEST - 2 VIEW COMPARISON:  December 25, 2015 FINDINGS: Lungs are clear. Heart size and pulmonary vascularity are normal. No adenopathy. No bone lesions. IMPRESSION: No edema or  consolidation. Electronically Signed   By: Bretta Bang III M.D.   On: 07/13/2017 10:34   Dg Lumbar Spine 2-3 Views  Result Date: 07/14/2017 CLINICAL DATA:  L5-S1 ALIF. EXAM: DG C-ARM 61-120 MIN; LUMBAR SPINE - 2-3 VIEW COMPARISON:  06/02/2016 abdominal/pelvic CT. FINDINGS: Two intraoperative spot films of the LOWER lumbar spine are submitted postoperatively for interpretation. A single screw within S1 noted. Intervertebral markers at L5-S1 identified. IMPRESSION: Surgical hardware at L5-S1. Electronically Signed   By: Harmon Pier M.D.   On: 07/14/2017 11:04   Dg Lumbar Spine 1 View  Result Date: 07/15/2017 CLINICAL DATA:  L5-S1 posterior fusion EXAM: LUMBAR SPINE - 1 VIEW; DG C-ARM 61-120 MIN COMPARISON:  None. FINDINGS: Changes of anterior and posterior fusion at L5-S1. Normal alignment. No hardware or bony complicating feature. IMPRESSION: Fusion changes at L5-S1 as above.  No visible complicating feature. Electronically Signed   By: Charlett Nose M.D.   On: 07/15/2017 10:28   Dg C-arm 1-60 Min  Result Date: 07/15/2017 CLINICAL DATA:  L5-S1 posterior fusion EXAM: LUMBAR SPINE - 1 VIEW; DG C-ARM 61-120 MIN COMPARISON:  None. FINDINGS: Changes of anterior and posterior fusion at L5-S1. Normal alignment. No hardware or bony complicating feature. IMPRESSION: Fusion changes at L5-S1 as above.  No visible complicating feature. Electronically Signed   By: Charlett Nose M.D.   On: 07/15/2017 10:28   Dg C-arm 1-60 Min  Result Date: 07/14/2017 CLINICAL DATA:  L5-S1 ALIF. EXAM: DG C-ARM 61-120 MIN; LUMBAR SPINE - 2-3 VIEW COMPARISON:  06/02/2016 abdominal/pelvic CT. FINDINGS: Two intraoperative spot films of the LOWER lumbar spine are submitted postoperatively for interpretation. A single screw within S1 noted. Intervertebral markers at L5-S1 identified. IMPRESSION: Surgical hardware at L5-S1. Electronically Signed   By: Harmon Pier M.D.   On: 07/14/2017 11:04   Dg C-arm 1-60 Min  Result Date:  07/14/2017 CLINICAL DATA:  L5-S1 ALIF. EXAM: DG C-ARM 61-120 MIN; LUMBAR SPINE - 2-3 VIEW COMPARISON:  06/02/2016 abdominal/pelvic CT. FINDINGS: Two intraoperative spot films of the LOWER lumbar spine are submitted postoperatively for interpretation. A single screw within S1 noted. Intervertebral markers at L5-S1 identified. IMPRESSION: Surgical hardware at L5-S1. Electronically Signed   By: Harmon Pier M.D.   On: 07/14/2017 11:04   Dg Or Local Abdomen  Result Date: 07/14/2017 CLINICAL DATA:  Postoperative lumbar surgery. Evaluate for unexpected intra months. EXAM: OR LOCAL ABDOMEN COMPARISON:  None. FINDINGS: No unexpected radiopaque foreign bodies or instruments identified. Lumbar surgical screw and markers noted. IMPRESSION: No unexpected radiopaque foreign bodies. Electronically Signed   By: Harmon Pier M.D.   On: 07/14/2017 10:54    Disposition: Discharge disposition: 01-Home or Self Care       Discharge Instructions  Discharge patient   Complete by:  As directed    After PT/OT   Discharge disposition:  01-Home or Self Care   Discharge patient date:  07/16/2017     PO Day 1 & 2 S/P staged ANT/POST L5-S1 Fusiom  -Resolved Leg Pain -Written scripts for pain signed and in chart -D/C instructions sheet printed and in chart -D/C today  -F/U in office 2 weeks   Signed: Georga Bora 07/22/2017, 10:15 AM

## 2017-12-29 ENCOUNTER — Ambulatory Visit (HOSPITAL_COMMUNITY)
Admission: RE | Admit: 2017-12-29 | Discharge: 2017-12-29 | Disposition: A | Discharge status: Home / Self Care | Payer: 59 | Source: Ambulatory Visit | Attending: Adult Health Nurse Practitioner | Admitting: Adult Health Nurse Practitioner

## 2017-12-29 ENCOUNTER — Other Ambulatory Visit (HOSPITAL_COMMUNITY): Payer: Self-pay | Admitting: Adult Health Nurse Practitioner

## 2017-12-29 DIAGNOSIS — R05 Cough: Secondary | ICD-10-CM

## 2017-12-29 DIAGNOSIS — R059 Cough, unspecified: Secondary | ICD-10-CM

## 2019-01-12 ENCOUNTER — Telehealth: Payer: Self-pay | Admitting: Adult Health

## 2019-01-12 NOTE — Telephone Encounter (Signed)

## 2019-01-13 ENCOUNTER — Other Ambulatory Visit (HOSPITAL_COMMUNITY)
Admission: RE | Admit: 2019-01-13 | Discharge: 2019-01-13 | Disposition: A | Discharge status: Home / Self Care | Payer: Managed Care, Other (non HMO) | Source: Ambulatory Visit | Attending: Adult Health | Admitting: Adult Health

## 2019-01-13 ENCOUNTER — Encounter: Payer: Self-pay | Admitting: Adult Health

## 2019-01-13 ENCOUNTER — Ambulatory Visit (INDEPENDENT_AMBULATORY_CARE_PROVIDER_SITE_OTHER): Payer: Managed Care, Other (non HMO) | Admitting: Adult Health

## 2019-01-13 ENCOUNTER — Other Ambulatory Visit: Payer: Self-pay

## 2019-01-13 VITALS — BP 107/68 | HR 69 | Ht 64.0 in | Wt 193.0 lb

## 2019-01-13 DIAGNOSIS — Z1212 Encounter for screening for malignant neoplasm of rectum: Secondary | ICD-10-CM | POA: Diagnosis not present

## 2019-01-13 DIAGNOSIS — Z01419 Encounter for gynecological examination (general) (routine) without abnormal findings: Secondary | ICD-10-CM | POA: Diagnosis not present

## 2019-01-13 DIAGNOSIS — Z1211 Encounter for screening for malignant neoplasm of colon: Secondary | ICD-10-CM

## 2019-01-13 LAB — HEMOCCULT GUIAC POC 1CARD (OFFICE): Fecal Occult Blood, POC: NEGATIVE

## 2019-01-13 NOTE — Progress Notes (Signed)
Patient ID: [name], female   DOB: 29-Apr-1975, 43 y.o.   MRN: [id number] History of Present Illness: Kathleen Stewart is a 43 year old white female, married in for a pelvic and pap.She had physical and labs at PCP. PCP is Dr Kathleen Stewart.   Current Medications, Allergies, Past Medical History, Past Surgical History, Family History and Social History were reviewed in [employer] record.     Review of Systems: Patient denies any headaches, hearing loss, fatigue, blurred vision, shortness of breath, chest pain, abdominal pain, problems with bowel movements, urination, or intercourse. No joint pain or mood swings. No bleeding since ablation.     Physical Exam:BP 107/68 (BP Location: Left Arm, Patient Position: Sitting, Cuff Size: Normal)   Pulse 69   Ht 5\' 4"  (1.626 m)   Wt 193 lb (87.5 kg)   BMI 33.13 kg/m  General:  Well developed, well nourished, no acute distress Skin:  Warm and dry,has lots of tattoos  Neck:  Midline trachea, normal thyroid, good ROM, no lymphadenopathy Lungs; Clear to auscultation bilaterally Cardiovascular: Regular rate and rhythm Abdomen:  Soft, non tender, no hepatosplenomegaly Pelvic:  External genitalia is normal in appearance, no lesions.  The vagina is normal in appearance. Urethra has no lesions or masses. The cervix is bulbous and smooth, pap with high risk HPV 16/18 genotyping performed by Kathleen Croon FNP student under my supervision.  Uterus is felt to be normal size, shape, and contour.  No adnexal masses or tenderness noted.Bladder is non tender, no masses felt. Rectal: Good sphincter tone, no polyps, or hemorrhoids felt.  Hemoccult negative. Extremities/musculoskeletal:  No swelling or varicosities noted, no clubbing or cyanosis Psych:  No mood changes, alert and cooperative,seems happy Fall risk is low PHQ 2 score is 1.    Impression and Plan: 1. Encounter for gynecological examination with Papanicolaou smear of cervix Pap  sent Physical with PCP  Labs with PCP Get mammogram now and yearly Pap in 3 years if normal  2. Screening for colorectal cancer

## 2019-01-19 LAB — CYTOLOGY - PAP
Adequacy: ABSENT
Comment: NEGATIVE
Diagnosis: NEGATIVE
High risk HPV: NEGATIVE

## 2019-03-07 ENCOUNTER — Other Ambulatory Visit: Payer: Self-pay

## 2019-03-07 ENCOUNTER — Ambulatory Visit: Payer: Managed Care, Other (non HMO) | Attending: Internal Medicine

## 2019-03-07 DIAGNOSIS — Z20822 Contact with and (suspected) exposure to covid-19: Secondary | ICD-10-CM

## 2019-03-09 LAB — NOVEL CORONAVIRUS, NAA: SARS-CoV-2, NAA: NOT DETECTED

## 2020-04-04 DIAGNOSIS — L659 Nonscarring hair loss, unspecified: Secondary | ICD-10-CM | POA: Diagnosis not present

## 2020-04-05 DIAGNOSIS — E782 Mixed hyperlipidemia: Secondary | ICD-10-CM | POA: Diagnosis not present

## 2020-04-05 DIAGNOSIS — Z0000 Encounter for general adult medical examination without abnormal findings: Secondary | ICD-10-CM | POA: Diagnosis not present

## 2020-04-05 DIAGNOSIS — F331 Major depressive disorder, recurrent, moderate: Secondary | ICD-10-CM | POA: Diagnosis not present

## 2020-04-05 DIAGNOSIS — Z23 Encounter for immunization: Secondary | ICD-10-CM | POA: Diagnosis not present

## 2020-04-05 DIAGNOSIS — R062 Wheezing: Secondary | ICD-10-CM | POA: Diagnosis not present

## 2020-04-05 DIAGNOSIS — E669 Obesity, unspecified: Secondary | ICD-10-CM | POA: Diagnosis not present

## 2020-04-05 DIAGNOSIS — R0602 Shortness of breath: Secondary | ICD-10-CM | POA: Diagnosis not present

## 2020-05-14 DIAGNOSIS — Z0000 Encounter for general adult medical examination without abnormal findings: Secondary | ICD-10-CM | POA: Diagnosis not present

## 2020-05-14 DIAGNOSIS — R062 Wheezing: Secondary | ICD-10-CM | POA: Diagnosis not present

## 2020-05-14 DIAGNOSIS — Z23 Encounter for immunization: Secondary | ICD-10-CM | POA: Diagnosis not present

## 2020-05-14 DIAGNOSIS — E669 Obesity, unspecified: Secondary | ICD-10-CM | POA: Diagnosis not present

## 2020-05-14 DIAGNOSIS — F331 Major depressive disorder, recurrent, moderate: Secondary | ICD-10-CM | POA: Diagnosis not present

## 2020-05-14 DIAGNOSIS — E782 Mixed hyperlipidemia: Secondary | ICD-10-CM | POA: Diagnosis not present

## 2020-05-14 DIAGNOSIS — R0602 Shortness of breath: Secondary | ICD-10-CM | POA: Diagnosis not present

## 2020-05-16 DIAGNOSIS — E782 Mixed hyperlipidemia: Secondary | ICD-10-CM | POA: Diagnosis not present

## 2020-05-16 DIAGNOSIS — F331 Major depressive disorder, recurrent, moderate: Secondary | ICD-10-CM | POA: Diagnosis not present

## 2020-05-16 DIAGNOSIS — E669 Obesity, unspecified: Secondary | ICD-10-CM | POA: Diagnosis not present

## 2020-05-16 DIAGNOSIS — M5136 Other intervertebral disc degeneration, lumbar region: Secondary | ICD-10-CM | POA: Diagnosis not present

## 2020-08-23 DIAGNOSIS — E782 Mixed hyperlipidemia: Secondary | ICD-10-CM | POA: Diagnosis not present

## 2020-08-23 DIAGNOSIS — F411 Generalized anxiety disorder: Secondary | ICD-10-CM | POA: Diagnosis not present

## 2020-08-23 DIAGNOSIS — F329 Major depressive disorder, single episode, unspecified: Secondary | ICD-10-CM | POA: Diagnosis not present

## 2020-11-11 DIAGNOSIS — J019 Acute sinusitis, unspecified: Secondary | ICD-10-CM | POA: Diagnosis not present

## 2020-11-11 DIAGNOSIS — R059 Cough, unspecified: Secondary | ICD-10-CM | POA: Diagnosis not present

## 2020-11-18 ENCOUNTER — Other Ambulatory Visit: Payer: Self-pay

## 2020-11-18 ENCOUNTER — Other Ambulatory Visit (HOSPITAL_COMMUNITY): Payer: Self-pay | Admitting: Family Medicine

## 2020-11-18 ENCOUNTER — Ambulatory Visit (HOSPITAL_COMMUNITY)
Admission: RE | Admit: 2020-11-18 | Discharge: 2020-11-18 | Disposition: A | Discharge status: Home / Self Care | Payer: BC Managed Care – PPO | Source: Ambulatory Visit | Attending: Family Medicine | Admitting: Family Medicine

## 2020-11-18 DIAGNOSIS — R110 Nausea: Secondary | ICD-10-CM | POA: Diagnosis not present

## 2020-11-18 DIAGNOSIS — R053 Chronic cough: Secondary | ICD-10-CM | POA: Insufficient documentation

## 2020-11-18 DIAGNOSIS — F411 Generalized anxiety disorder: Secondary | ICD-10-CM | POA: Diagnosis not present

## 2020-11-18 DIAGNOSIS — B373 Candidiasis of vulva and vagina: Secondary | ICD-10-CM | POA: Diagnosis not present

## 2020-11-18 DIAGNOSIS — R059 Cough, unspecified: Secondary | ICD-10-CM | POA: Diagnosis not present

## 2021-03-21 DIAGNOSIS — J029 Acute pharyngitis, unspecified: Secondary | ICD-10-CM | POA: Diagnosis not present

## 2021-03-21 DIAGNOSIS — J069 Acute upper respiratory infection, unspecified: Secondary | ICD-10-CM | POA: Diagnosis not present

## 2021-05-30 DIAGNOSIS — E782 Mixed hyperlipidemia: Secondary | ICD-10-CM | POA: Diagnosis not present

## 2021-05-30 DIAGNOSIS — Z0000 Encounter for general adult medical examination without abnormal findings: Secondary | ICD-10-CM | POA: Diagnosis not present

## 2021-05-30 DIAGNOSIS — Z139 Encounter for screening, unspecified: Secondary | ICD-10-CM | POA: Diagnosis not present

## 2021-06-02 ENCOUNTER — Other Ambulatory Visit (HOSPITAL_COMMUNITY): Payer: Self-pay | Admitting: Family Medicine

## 2021-06-02 DIAGNOSIS — Z1231 Encounter for screening mammogram for malignant neoplasm of breast: Secondary | ICD-10-CM

## 2021-06-02 DIAGNOSIS — Z0001 Encounter for general adult medical examination with abnormal findings: Secondary | ICD-10-CM | POA: Diagnosis not present

## 2021-06-03 ENCOUNTER — Other Ambulatory Visit (HOSPITAL_COMMUNITY): Payer: Self-pay | Admitting: Family Medicine

## 2021-06-03 ENCOUNTER — Other Ambulatory Visit: Payer: Self-pay | Admitting: Family Medicine

## 2021-06-03 DIAGNOSIS — M79644 Pain in right finger(s): Secondary | ICD-10-CM

## 2021-06-12 DIAGNOSIS — M1811 Unilateral primary osteoarthritis of first carpometacarpal joint, right hand: Secondary | ICD-10-CM | POA: Diagnosis not present

## 2021-06-25 ENCOUNTER — Other Ambulatory Visit (HOSPITAL_COMMUNITY): Payer: Self-pay | Admitting: Family Medicine

## 2021-06-25 DIAGNOSIS — N644 Mastodynia: Secondary | ICD-10-CM

## 2021-07-14 ENCOUNTER — Other Ambulatory Visit (HOSPITAL_COMMUNITY): Payer: Self-pay | Admitting: Family Medicine

## 2021-07-14 DIAGNOSIS — Z1231 Encounter for screening mammogram for malignant neoplasm of breast: Secondary | ICD-10-CM

## 2021-07-17 ENCOUNTER — Encounter (HOSPITAL_COMMUNITY): Payer: Self-pay

## 2021-07-17 ENCOUNTER — Encounter (HOSPITAL_COMMUNITY): Payer: BC Managed Care – PPO

## 2021-07-17 ENCOUNTER — Ambulatory Visit (HOSPITAL_COMMUNITY): Payer: BC Managed Care – PPO

## 2021-07-25 ENCOUNTER — Ambulatory Visit (HOSPITAL_COMMUNITY)
Admission: RE | Admit: 2021-07-25 | Discharge: 2021-07-25 | Disposition: A | Discharge status: Home / Self Care | Payer: BC Managed Care – PPO | Source: Ambulatory Visit | Attending: Family Medicine | Admitting: Family Medicine

## 2021-07-25 DIAGNOSIS — Z1231 Encounter for screening mammogram for malignant neoplasm of breast: Secondary | ICD-10-CM | POA: Insufficient documentation

## 2021-09-08 DIAGNOSIS — J069 Acute upper respiratory infection, unspecified: Secondary | ICD-10-CM | POA: Diagnosis not present

## 2021-09-08 DIAGNOSIS — F331 Major depressive disorder, recurrent, moderate: Secondary | ICD-10-CM | POA: Diagnosis not present

## 2021-09-08 DIAGNOSIS — F411 Generalized anxiety disorder: Secondary | ICD-10-CM | POA: Diagnosis not present

## 2021-10-23 DIAGNOSIS — C44719 Basal cell carcinoma of skin of left lower limb, including hip: Secondary | ICD-10-CM | POA: Diagnosis not present

## 2021-10-23 DIAGNOSIS — B078 Other viral warts: Secondary | ICD-10-CM | POA: Diagnosis not present

## 2021-10-23 DIAGNOSIS — L821 Other seborrheic keratosis: Secondary | ICD-10-CM | POA: Diagnosis not present

## 2021-10-23 DIAGNOSIS — L814 Other melanin hyperpigmentation: Secondary | ICD-10-CM | POA: Diagnosis not present

## 2021-10-23 DIAGNOSIS — C44712 Basal cell carcinoma of skin of right lower limb, including hip: Secondary | ICD-10-CM | POA: Diagnosis not present

## 2021-11-27 DIAGNOSIS — E782 Mixed hyperlipidemia: Secondary | ICD-10-CM | POA: Diagnosis not present

## 2021-12-03 DIAGNOSIS — R110 Nausea: Secondary | ICD-10-CM | POA: Diagnosis not present

## 2021-12-03 DIAGNOSIS — F331 Major depressive disorder, recurrent, moderate: Secondary | ICD-10-CM | POA: Diagnosis not present

## 2021-12-03 DIAGNOSIS — M5136 Other intervertebral disc degeneration, lumbar region: Secondary | ICD-10-CM | POA: Diagnosis not present

## 2021-12-03 DIAGNOSIS — E782 Mixed hyperlipidemia: Secondary | ICD-10-CM | POA: Diagnosis not present

## 2022-01-15 ENCOUNTER — Encounter: Payer: Self-pay | Admitting: Adult Health

## 2022-01-15 ENCOUNTER — Other Ambulatory Visit (HOSPITAL_COMMUNITY)
Admission: RE | Admit: 2022-01-15 | Discharge: 2022-01-15 | Disposition: A | Discharge status: Home / Self Care | Payer: BC Managed Care – PPO | Source: Ambulatory Visit | Attending: Adult Health | Admitting: Adult Health

## 2022-01-15 ENCOUNTER — Ambulatory Visit (INDEPENDENT_AMBULATORY_CARE_PROVIDER_SITE_OTHER): Payer: BC Managed Care – PPO | Admitting: Adult Health

## 2022-01-15 VITALS — BP 108/71 | HR 77 | Ht 64.0 in | Wt 167.5 lb

## 2022-01-15 DIAGNOSIS — N393 Stress incontinence (female) (male): Secondary | ICD-10-CM

## 2022-01-15 DIAGNOSIS — Z1212 Encounter for screening for malignant neoplasm of rectum: Secondary | ICD-10-CM | POA: Diagnosis not present

## 2022-01-15 DIAGNOSIS — Z1211 Encounter for screening for malignant neoplasm of colon: Secondary | ICD-10-CM | POA: Diagnosis not present

## 2022-01-15 DIAGNOSIS — Z01419 Encounter for gynecological examination (general) (routine) without abnormal findings: Secondary | ICD-10-CM

## 2022-01-15 LAB — HEMOCCULT GUIAC POC 1CARD (OFFICE): Fecal Occult Blood, POC: NEGATIVE

## 2022-01-15 NOTE — Progress Notes (Signed)
Patient ID: Kathleen Stewart, female   DOB: 14-Aug-1975, 46 y.o.   MRN: [id number] History of Present Illness: Kathleen Stewart is a 46 year old white female,married, [id number] in for a well woman gyn exam and pap.  PCP is Dr Margo Aye.   Current Medications, Allergies, Past Medical History, Past Surgical History, Family History and Social History were reviewed in [name] record.     Review of Systems: Patient denies any headaches, hearing loss, fatigue, blurred vision, shortness of breath, chest pain, abdominal pain, problems with bowel movements(sp GB so goes often), or intercourse. No joint pain or mood swings.  Has UI if coughs or sneezes or jumps on trampoline Sp ablation, no bleeding   Physical Exam:BP 108/71 (BP Location: Right Arm, Patient Position: Sitting, Cuff Size: Normal)   Pulse 77   Ht 5\' 4"  (1.626 m)   Wt 167 lb 8 oz (76 kg)   BMI 28.75 kg/m   General:  Well developed, well nourished, no acute distress Skin:  Warm and dry Neck:  Midline trachea, normal thyroid, good ROM, no lymphadenopathy Lungs; Clear to auscultation bilaterally Breast:  No dominant palpable mass, retraction, or nipple discharge Cardiovascular: Regular rate and rhythm Abdomen:  Soft, non tender, no hepatosplenomegaly Pelvic:  External genitalia is normal in appearance, no lesions.  The vagina is normal in appearance. Urethra has no lesions or masses. The cervix is smooth, pap with HR HPV performed, and cervix was friable with EC brush.  Uterus is felt to be normal size, shape, and contour.  No adnexal masses or tenderness noted.Bladder is non tender, no masses felt. Rectal: Good sphincter tone, no polyps, or hemorrhoids felt.  Hemoccult negative. Extremities/musculoskeletal:  No swelling or varicosities noted, no clubbing or cyanosis Psych:  No mood changes, alert and cooperative,seems happy AA is 1 Fall risk is low    47/16/2023    8:38 AM 47/13/2020    9:13 AM  Depression screen PHQ 2/9   Decreased Interest 0 0  Down, Depressed, Hopeless 0 1  PHQ - 2 Score 0 1  Altered sleeping 0   Tired, decreased energy 0   Change in appetite 0   Feeling bad or failure about yourself  0   Trouble concentrating 0   Moving slowly or fidgety/restless 0   Suicidal thoughts 0   PHQ-9 Score 0        01/15/2022    8:39 AM  GAD 7 : Generalized Anxiety Score  Nervous, Anxious, on Edge 0  Control/stop worrying 0  Worry too much - different things 0  Trouble relaxing 0  Restless 0  Easily annoyed or irritable 0  Afraid - awful might happen 0  Total GAD 7 Score 0      Upstream - 01/15/22 0839       Pregnancy Intention Screening   Does the patient want to become pregnant in the next year? No    Does the patient's partner want to become pregnant in the next year? No    Would the patient like to discuss contraceptive options today? No      Contraception Wrap Up   Current Method Vasectomy    End Method Vasectomy    Contraception Counseling Provided No            Examination chaperoned by 01/17/22 RN  Impression and Plan: 1. Encounter for gynecological examination with Papanicolaou smear of cervix Pap sent Pap in 3 years if normal Physical next year with PCP Labs with PCP  Had normal mammogram 07/25/21 - Cytology - PAP Stay active   2. Encounter for screening fecal occult blood testing Hemoccult was negative  - POCT occult blood stool  3. SUI (stress urinary incontinence, female) Try kegels, andreview handout and video Try to decrease smoking   4. Screening for colorectal cancer Will order cologuard for her  - Cologuard

## 2022-01-19 LAB — CYTOLOGY - PAP
Comment: NEGATIVE
Diagnosis: NEGATIVE
Diagnosis: REACTIVE
High risk HPV: NEGATIVE

## 2022-04-16 ENCOUNTER — Other Ambulatory Visit (HOSPITAL_COMMUNITY): Payer: Self-pay | Admitting: Family Medicine

## 2022-04-16 ENCOUNTER — Ambulatory Visit (HOSPITAL_COMMUNITY)
Admission: RE | Admit: 2022-04-16 | Discharge: 2022-04-16 | Disposition: A | Discharge status: Home / Self Care | Payer: BC Managed Care – PPO | Source: Ambulatory Visit | Attending: Family Medicine | Admitting: Family Medicine

## 2022-04-16 DIAGNOSIS — R202 Paresthesia of skin: Secondary | ICD-10-CM

## 2022-04-16 DIAGNOSIS — M5136 Other intervertebral disc degeneration, lumbar region: Secondary | ICD-10-CM | POA: Diagnosis not present

## 2022-04-23 DIAGNOSIS — M5416 Radiculopathy, lumbar region: Secondary | ICD-10-CM | POA: Diagnosis not present

## 2022-04-23 DIAGNOSIS — Z6829 Body mass index (BMI) 29.0-29.9, adult: Secondary | ICD-10-CM | POA: Diagnosis not present

## 2022-05-07 DIAGNOSIS — M5416 Radiculopathy, lumbar region: Secondary | ICD-10-CM | POA: Diagnosis not present

## 2022-05-07 DIAGNOSIS — M5126 Other intervertebral disc displacement, lumbar region: Secondary | ICD-10-CM | POA: Diagnosis not present

## 2022-05-07 DIAGNOSIS — Z01818 Encounter for other preprocedural examination: Secondary | ICD-10-CM | POA: Diagnosis not present

## 2022-05-14 DIAGNOSIS — M5416 Radiculopathy, lumbar region: Secondary | ICD-10-CM | POA: Diagnosis not present

## 2022-05-18 DIAGNOSIS — J069 Acute upper respiratory infection, unspecified: Secondary | ICD-10-CM | POA: Diagnosis not present

## 2022-05-18 DIAGNOSIS — F17210 Nicotine dependence, cigarettes, uncomplicated: Secondary | ICD-10-CM | POA: Diagnosis not present

## 2022-05-18 DIAGNOSIS — Z20828 Contact with and (suspected) exposure to other viral communicable diseases: Secondary | ICD-10-CM | POA: Diagnosis not present

## 2022-05-18 DIAGNOSIS — M5136 Other intervertebral disc degeneration, lumbar region: Secondary | ICD-10-CM | POA: Diagnosis not present

## 2022-05-18 DIAGNOSIS — J302 Other seasonal allergic rhinitis: Secondary | ICD-10-CM | POA: Diagnosis not present

## 2022-05-28 DIAGNOSIS — E782 Mixed hyperlipidemia: Secondary | ICD-10-CM | POA: Diagnosis not present

## 2022-06-04 ENCOUNTER — Other Ambulatory Visit (HOSPITAL_COMMUNITY): Payer: Self-pay | Admitting: Internal Medicine

## 2022-06-04 DIAGNOSIS — E782 Mixed hyperlipidemia: Secondary | ICD-10-CM

## 2022-06-04 DIAGNOSIS — F17210 Nicotine dependence, cigarettes, uncomplicated: Secondary | ICD-10-CM | POA: Diagnosis not present

## 2022-06-04 DIAGNOSIS — F331 Major depressive disorder, recurrent, moderate: Secondary | ICD-10-CM | POA: Diagnosis not present

## 2022-06-04 DIAGNOSIS — M5136 Other intervertebral disc degeneration, lumbar region: Secondary | ICD-10-CM | POA: Diagnosis not present

## 2022-07-31 ENCOUNTER — Encounter (HOSPITAL_COMMUNITY): Payer: Self-pay

## 2022-07-31 ENCOUNTER — Ambulatory Visit (HOSPITAL_COMMUNITY): Admission: RE | Admit: 2022-07-31 | Payer: BC Managed Care – PPO | Source: Ambulatory Visit

## 2022-10-22 IMAGING — MG MM DIGITAL SCREENING BILAT W/ TOMO AND CAD
8 series · 9 of 24 positions shown · non-contrast
Comparison: Previous exam(s).

CLINICAL DATA: Screening.

EXAM:
DIGITAL SCREENING BILATERAL MAMMOGRAM WITH TOMOSYNTHESIS AND CAD
TECHNIQUE: Bilateral screening digital craniocaudal and mediolateral oblique
mammograms were obtained. Bilateral screening digital breast
tomosynthesis was performed. The images were evaluated with
computer-aided detection.

[R CC synth-2D]
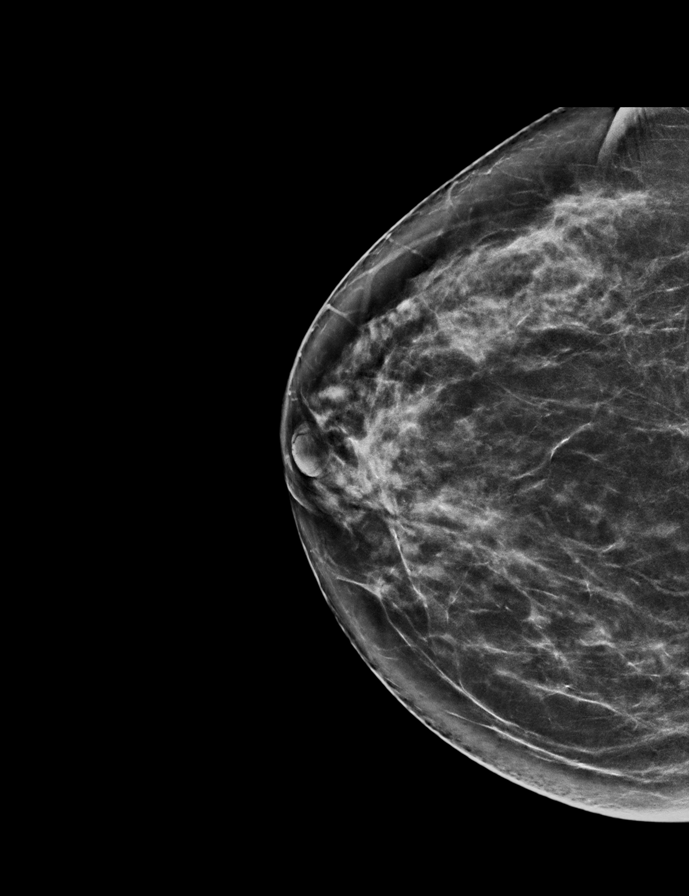

[L MLO synth-2D]
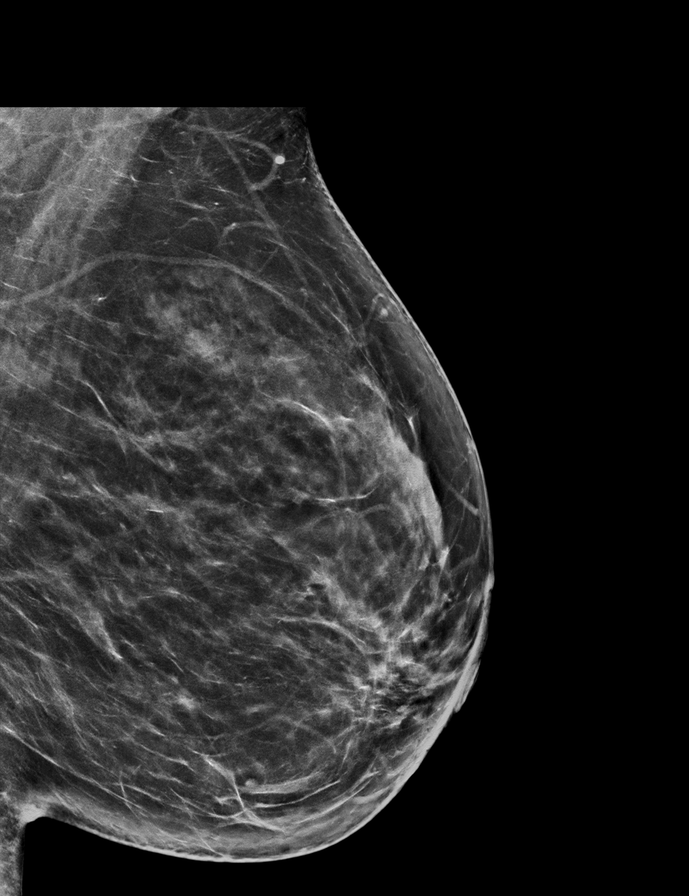

[L CC synth-2D]
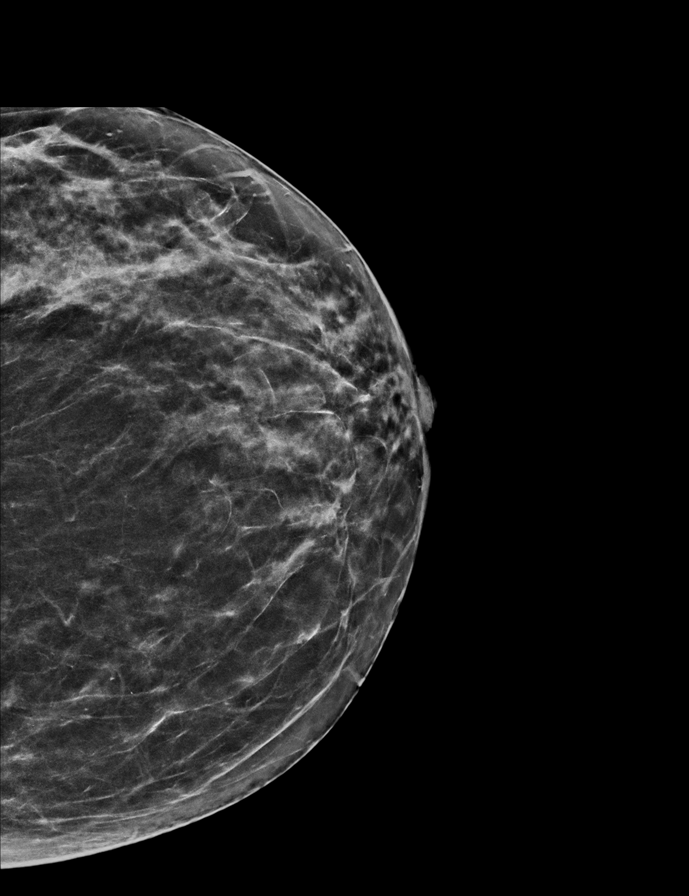

[R MLO synth-2D]
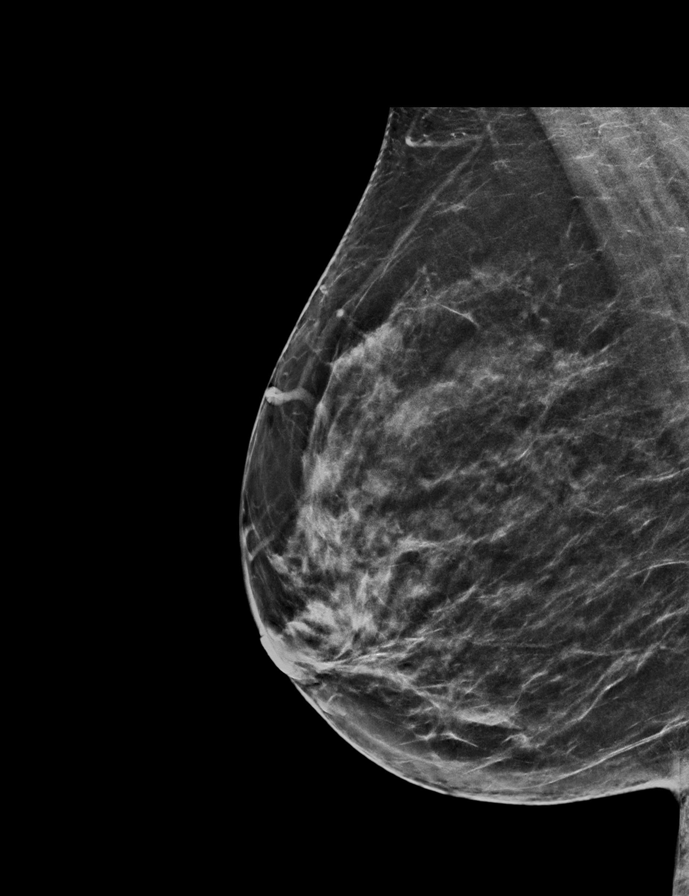

[L CC tomo · 2 of 61 frames shown]
[frame 20/61]
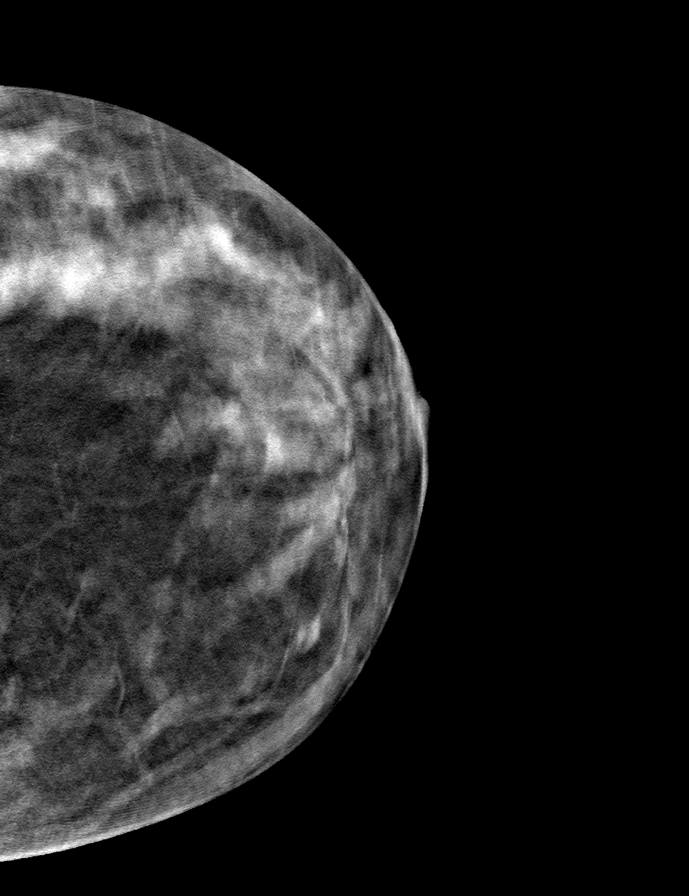
[frame 31/61]
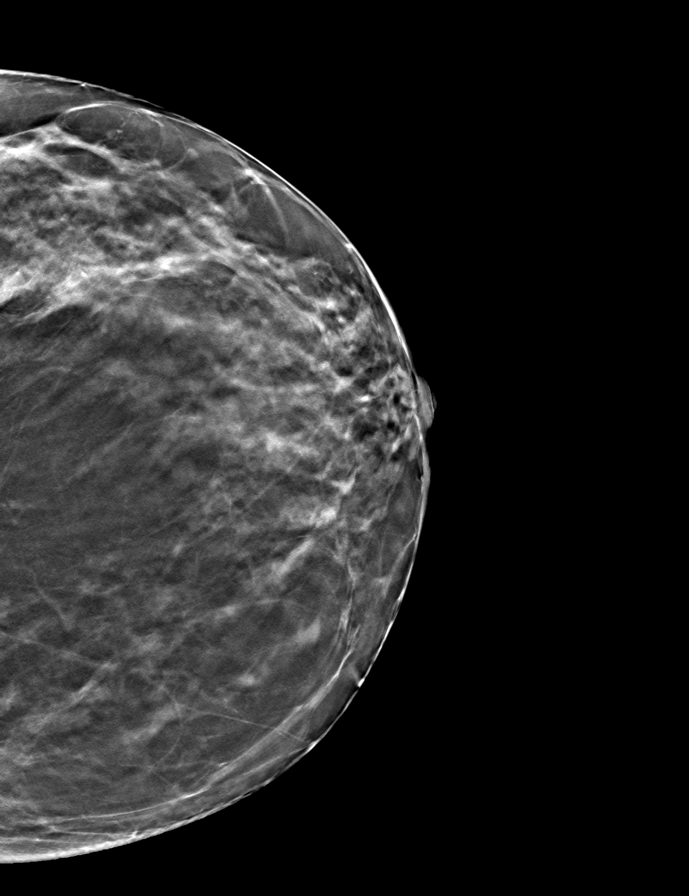

[L MLO tomo · tomo slice 39/76.0]
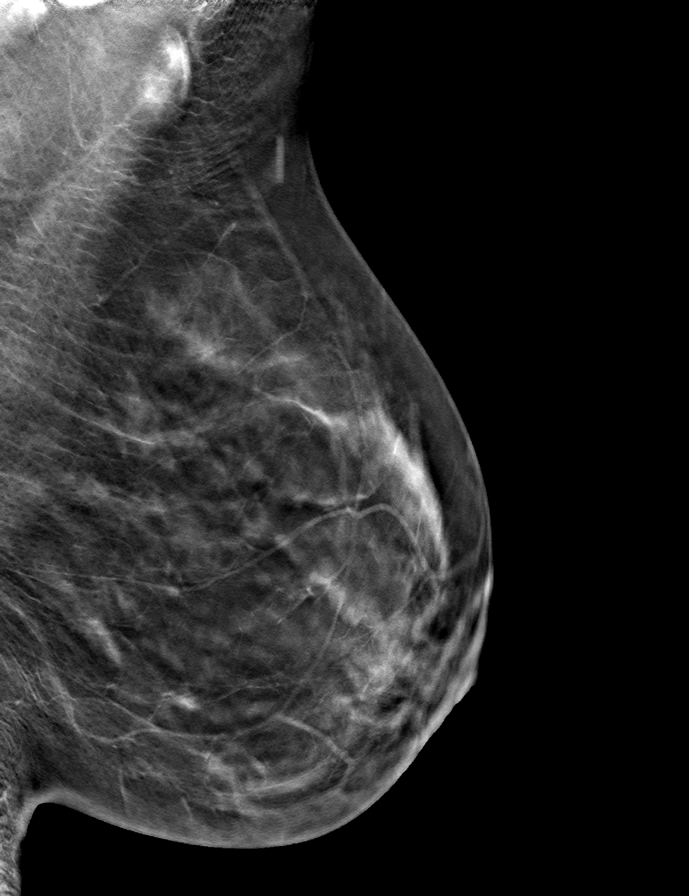

[R MLO tomo · tomo slice 35/68.0]
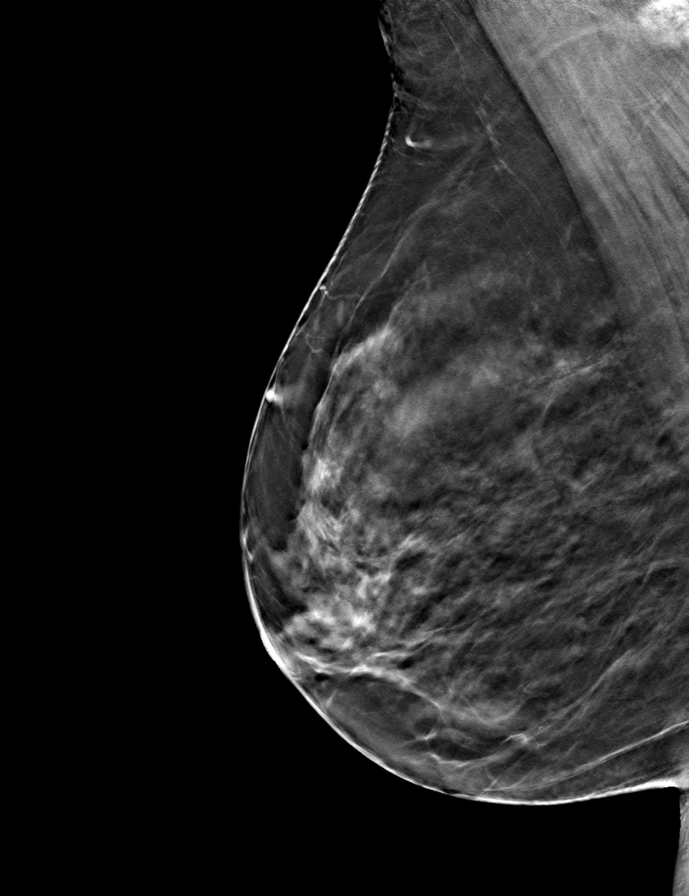

[R CC tomo · tomo slice 37/74.0]
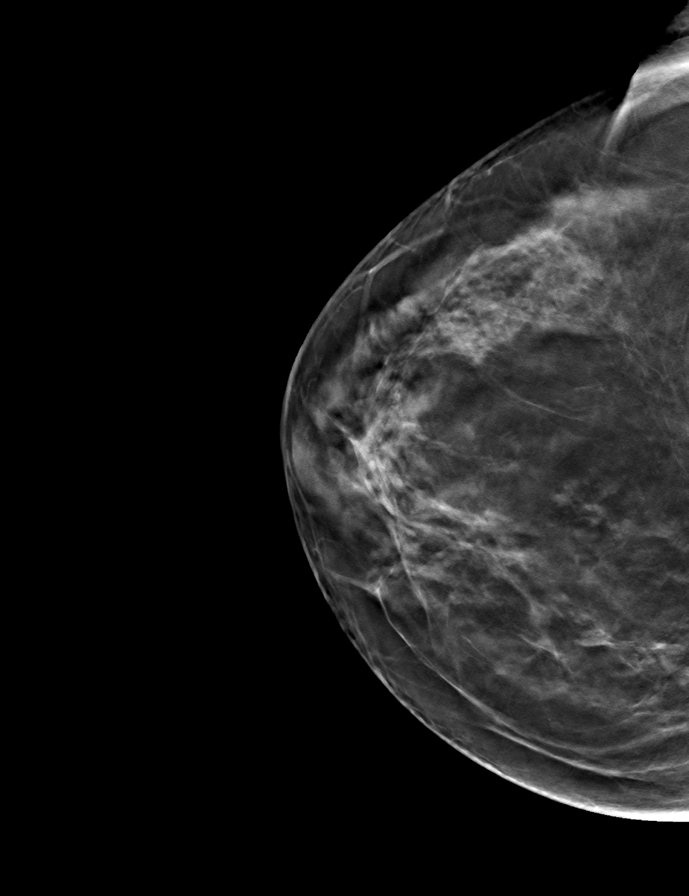

[9 of 24 positions shown; findings below may reference images not displayed]

ACR Breast Density Category c: The breast tissue is heterogeneously
dense, which may obscure small masses.
FINDINGS: There are no findings suspicious for malignancy.
IMPRESSION: No mammographic evidence of malignancy. A result letter of this
screening mammogram will be mailed directly to the patient.

RECOMMENDATION:
Screening mammogram in one year. (Code:Q3-W-BC3)

BI-RADS CATEGORY  1: Negative.

## 2023-02-25 ENCOUNTER — Other Ambulatory Visit (HOSPITAL_COMMUNITY): Payer: Self-pay | Admitting: Internal Medicine

## 2023-02-25 DIAGNOSIS — E782 Mixed hyperlipidemia: Secondary | ICD-10-CM

## 2023-03-08 ENCOUNTER — Ambulatory Visit (HOSPITAL_COMMUNITY)
Admission: RE | Admit: 2023-03-08 | Discharge: 2023-03-08 | Disposition: A | Discharge status: Home / Self Care | Payer: Self-pay | Source: Ambulatory Visit | Attending: Internal Medicine | Admitting: Internal Medicine

## 2023-03-08 DIAGNOSIS — E782 Mixed hyperlipidemia: Secondary | ICD-10-CM | POA: Insufficient documentation

## 2024-03-15 ENCOUNTER — Other Ambulatory Visit (HOSPITAL_COMMUNITY): Payer: Self-pay | Admitting: Internal Medicine

## 2024-03-15 DIAGNOSIS — Z1231 Encounter for screening mammogram for malignant neoplasm of breast: Secondary | ICD-10-CM

## 2024-03-27 ENCOUNTER — Ambulatory Visit (HOSPITAL_COMMUNITY)
Admission: RE | Admit: 2024-03-27 | Discharge: 2024-03-27 | Disposition: A | Discharge status: Home / Self Care | Source: Ambulatory Visit | Attending: Internal Medicine | Admitting: Internal Medicine

## 2024-03-27 DIAGNOSIS — Z1231 Encounter for screening mammogram for malignant neoplasm of breast: Secondary | ICD-10-CM | POA: Insufficient documentation

## 2024-05-25 ENCOUNTER — Ambulatory Visit: Admitting: Adult Health
